# Patient Record
Sex: Female | Born: 1935
Health system: Southern US, Community
[De-identification: ages and names within clinical notes are randomized; demographics above are authoritative.]

## PROBLEM LIST (undated history)

## (undated) DIAGNOSIS — C539 Malignant neoplasm of cervix uteri, unspecified: Secondary | ICD-10-CM

## (undated) DIAGNOSIS — D649 Anemia, unspecified: Secondary | ICD-10-CM

## (undated) DIAGNOSIS — I5022 Chronic systolic (congestive) heart failure: Secondary | ICD-10-CM

## (undated) DIAGNOSIS — I119 Hypertensive heart disease without heart failure: Secondary | ICD-10-CM

## (undated) DIAGNOSIS — E119 Type 2 diabetes mellitus without complications: Secondary | ICD-10-CM

## (undated) DIAGNOSIS — I447 Left bundle-branch block, unspecified: Secondary | ICD-10-CM

## (undated) DIAGNOSIS — C189 Malignant neoplasm of colon, unspecified: Secondary | ICD-10-CM

## (undated) DIAGNOSIS — I255 Ischemic cardiomyopathy: Secondary | ICD-10-CM

## (undated) DIAGNOSIS — M1990 Unspecified osteoarthritis, unspecified site: Secondary | ICD-10-CM

## (undated) DIAGNOSIS — Z8619 Personal history of other infectious and parasitic diseases: Secondary | ICD-10-CM

## (undated) DIAGNOSIS — I2510 Atherosclerotic heart disease of native coronary artery without angina pectoris: Secondary | ICD-10-CM

## (undated) DIAGNOSIS — E785 Hyperlipidemia, unspecified: Secondary | ICD-10-CM

## (undated) HISTORY — DX: Malignant neoplasm of cervix uteri, unspecified: C53.9

## (undated) HISTORY — DX: Atherosclerotic heart disease of native coronary artery without angina pectoris: I25.10

## (undated) HISTORY — DX: Unspecified osteoarthritis, unspecified site: M19.90

## (undated) HISTORY — DX: Malignant neoplasm of colon, unspecified: C18.9

## (undated) HISTORY — DX: Anemia, unspecified: D64.9

## (undated) HISTORY — PX: CORONARY STENT PLACEMENT: SHX1402

## (undated) HISTORY — DX: Hyperlipidemia, unspecified: E78.5

## (undated) HISTORY — DX: Chronic systolic (congestive) heart failure: I50.22

## (undated) HISTORY — DX: Hypertensive heart disease without heart failure: I11.9

## (undated) HISTORY — DX: Personal history of other infectious and parasitic diseases: Z86.19

## (undated) HISTORY — DX: Ischemic cardiomyopathy: I25.5

## (undated) HISTORY — DX: Left bundle-branch block, unspecified: I44.7

## (undated) HISTORY — DX: Type 2 diabetes mellitus without complications: E11.9

---

## 1999-09-21 DIAGNOSIS — C189 Malignant neoplasm of colon, unspecified: Secondary | ICD-10-CM

## 1999-09-21 HISTORY — PX: COLON RESECTION: SHX5231

## 1999-09-21 HISTORY — DX: Malignant neoplasm of colon, unspecified: C18.9

## 2003-09-21 DIAGNOSIS — C539 Malignant neoplasm of cervix uteri, unspecified: Secondary | ICD-10-CM

## 2003-09-21 HISTORY — DX: Malignant neoplasm of cervix uteri, unspecified: C53.9

## 2003-09-21 HISTORY — PX: TOTAL ABDOMINAL HYSTERECTOMY: SHX209

## 2011-11-19 HISTORY — PX: COLONOSCOPY: SHX174

## 2011-11-26 LAB — HM COLONOSCOPY

## 2014-09-27 DIAGNOSIS — M2011 Hallux valgus (acquired), right foot: Secondary | ICD-10-CM | POA: Diagnosis not present

## 2014-09-27 DIAGNOSIS — M79675 Pain in left toe(s): Secondary | ICD-10-CM | POA: Diagnosis not present

## 2014-09-27 DIAGNOSIS — M2012 Hallux valgus (acquired), left foot: Secondary | ICD-10-CM | POA: Diagnosis not present

## 2014-09-27 DIAGNOSIS — M2042 Other hammer toe(s) (acquired), left foot: Secondary | ICD-10-CM | POA: Diagnosis not present

## 2014-09-27 DIAGNOSIS — E119 Type 2 diabetes mellitus without complications: Secondary | ICD-10-CM | POA: Diagnosis not present

## 2014-09-27 DIAGNOSIS — M79674 Pain in right toe(s): Secondary | ICD-10-CM | POA: Diagnosis not present

## 2014-09-27 DIAGNOSIS — B351 Tinea unguium: Secondary | ICD-10-CM | POA: Diagnosis not present

## 2014-09-27 DIAGNOSIS — M2041 Other hammer toe(s) (acquired), right foot: Secondary | ICD-10-CM | POA: Diagnosis not present

## 2014-09-30 DIAGNOSIS — I10 Essential (primary) hypertension: Secondary | ICD-10-CM | POA: Diagnosis not present

## 2014-09-30 DIAGNOSIS — E119 Type 2 diabetes mellitus without complications: Secondary | ICD-10-CM | POA: Diagnosis not present

## 2014-10-14 DIAGNOSIS — I10 Essential (primary) hypertension: Secondary | ICD-10-CM | POA: Diagnosis not present

## 2014-10-14 DIAGNOSIS — E119 Type 2 diabetes mellitus without complications: Secondary | ICD-10-CM | POA: Diagnosis not present

## 2014-10-14 DIAGNOSIS — M2550 Pain in unspecified joint: Secondary | ICD-10-CM | POA: Diagnosis not present

## 2014-11-29 DIAGNOSIS — M79675 Pain in left toe(s): Secondary | ICD-10-CM | POA: Diagnosis not present

## 2014-11-29 DIAGNOSIS — M2011 Hallux valgus (acquired), right foot: Secondary | ICD-10-CM | POA: Diagnosis not present

## 2014-11-29 DIAGNOSIS — E119 Type 2 diabetes mellitus without complications: Secondary | ICD-10-CM | POA: Diagnosis not present

## 2014-11-29 DIAGNOSIS — M79674 Pain in right toe(s): Secondary | ICD-10-CM | POA: Diagnosis not present

## 2014-11-29 DIAGNOSIS — M2012 Hallux valgus (acquired), left foot: Secondary | ICD-10-CM | POA: Diagnosis not present

## 2014-11-29 DIAGNOSIS — M2041 Other hammer toe(s) (acquired), right foot: Secondary | ICD-10-CM | POA: Diagnosis not present

## 2014-11-29 DIAGNOSIS — M2042 Other hammer toe(s) (acquired), left foot: Secondary | ICD-10-CM | POA: Diagnosis not present

## 2014-11-29 DIAGNOSIS — B351 Tinea unguium: Secondary | ICD-10-CM | POA: Diagnosis not present

## 2015-01-31 DIAGNOSIS — M2011 Hallux valgus (acquired), right foot: Secondary | ICD-10-CM | POA: Diagnosis not present

## 2015-01-31 DIAGNOSIS — E1165 Type 2 diabetes mellitus with hyperglycemia: Secondary | ICD-10-CM | POA: Diagnosis not present

## 2015-01-31 DIAGNOSIS — M2012 Hallux valgus (acquired), left foot: Secondary | ICD-10-CM | POA: Diagnosis not present

## 2015-01-31 DIAGNOSIS — M79674 Pain in right toe(s): Secondary | ICD-10-CM | POA: Diagnosis not present

## 2015-01-31 DIAGNOSIS — M2042 Other hammer toe(s) (acquired), left foot: Secondary | ICD-10-CM | POA: Diagnosis not present

## 2015-01-31 DIAGNOSIS — M2041 Other hammer toe(s) (acquired), right foot: Secondary | ICD-10-CM | POA: Diagnosis not present

## 2015-01-31 DIAGNOSIS — B351 Tinea unguium: Secondary | ICD-10-CM | POA: Diagnosis not present

## 2015-01-31 DIAGNOSIS — M79675 Pain in left toe(s): Secondary | ICD-10-CM | POA: Diagnosis not present

## 2015-02-11 DIAGNOSIS — E119 Type 2 diabetes mellitus without complications: Secondary | ICD-10-CM | POA: Diagnosis not present

## 2015-02-11 DIAGNOSIS — E784 Other hyperlipidemia: Secondary | ICD-10-CM | POA: Diagnosis not present

## 2015-02-11 DIAGNOSIS — I10 Essential (primary) hypertension: Secondary | ICD-10-CM | POA: Diagnosis not present

## 2015-02-11 DIAGNOSIS — E039 Hypothyroidism, unspecified: Secondary | ICD-10-CM | POA: Diagnosis not present

## 2015-02-19 DIAGNOSIS — Z0000 Encounter for general adult medical examination without abnormal findings: Secondary | ICD-10-CM | POA: Diagnosis not present

## 2015-04-07 DIAGNOSIS — M2011 Hallux valgus (acquired), right foot: Secondary | ICD-10-CM | POA: Diagnosis not present

## 2015-04-07 DIAGNOSIS — M2012 Hallux valgus (acquired), left foot: Secondary | ICD-10-CM | POA: Diagnosis not present

## 2015-04-07 DIAGNOSIS — M79675 Pain in left toe(s): Secondary | ICD-10-CM | POA: Diagnosis not present

## 2015-04-07 DIAGNOSIS — B351 Tinea unguium: Secondary | ICD-10-CM | POA: Diagnosis not present

## 2015-04-07 DIAGNOSIS — M2041 Other hammer toe(s) (acquired), right foot: Secondary | ICD-10-CM | POA: Diagnosis not present

## 2015-04-07 DIAGNOSIS — M79674 Pain in right toe(s): Secondary | ICD-10-CM | POA: Diagnosis not present

## 2015-04-07 DIAGNOSIS — E1165 Type 2 diabetes mellitus with hyperglycemia: Secondary | ICD-10-CM | POA: Diagnosis not present

## 2015-04-07 DIAGNOSIS — M2042 Other hammer toe(s) (acquired), left foot: Secondary | ICD-10-CM | POA: Diagnosis not present

## 2015-06-09 DIAGNOSIS — M79674 Pain in right toe(s): Secondary | ICD-10-CM | POA: Diagnosis not present

## 2015-06-09 DIAGNOSIS — E1165 Type 2 diabetes mellitus with hyperglycemia: Secondary | ICD-10-CM | POA: Diagnosis not present

## 2015-06-09 DIAGNOSIS — M2012 Hallux valgus (acquired), left foot: Secondary | ICD-10-CM | POA: Diagnosis not present

## 2015-06-09 DIAGNOSIS — M2011 Hallux valgus (acquired), right foot: Secondary | ICD-10-CM | POA: Diagnosis not present

## 2015-06-09 DIAGNOSIS — M2041 Other hammer toe(s) (acquired), right foot: Secondary | ICD-10-CM | POA: Diagnosis not present

## 2015-06-09 DIAGNOSIS — M2042 Other hammer toe(s) (acquired), left foot: Secondary | ICD-10-CM | POA: Diagnosis not present

## 2015-06-09 DIAGNOSIS — B351 Tinea unguium: Secondary | ICD-10-CM | POA: Diagnosis not present

## 2015-06-09 DIAGNOSIS — M79675 Pain in left toe(s): Secondary | ICD-10-CM | POA: Diagnosis not present

## 2015-06-20 DIAGNOSIS — J209 Acute bronchitis, unspecified: Secondary | ICD-10-CM | POA: Diagnosis not present

## 2015-08-08 DIAGNOSIS — I10 Essential (primary) hypertension: Secondary | ICD-10-CM | POA: Diagnosis not present

## 2015-08-08 DIAGNOSIS — E119 Type 2 diabetes mellitus without complications: Secondary | ICD-10-CM | POA: Diagnosis not present

## 2015-08-08 DIAGNOSIS — E784 Other hyperlipidemia: Secondary | ICD-10-CM | POA: Diagnosis not present

## 2015-08-11 DIAGNOSIS — Z7984 Long term (current) use of oral hypoglycemic drugs: Secondary | ICD-10-CM | POA: Diagnosis not present

## 2015-08-11 DIAGNOSIS — M2011 Hallux valgus (acquired), right foot: Secondary | ICD-10-CM | POA: Diagnosis not present

## 2015-08-11 DIAGNOSIS — M2042 Other hammer toe(s) (acquired), left foot: Secondary | ICD-10-CM | POA: Diagnosis not present

## 2015-08-11 DIAGNOSIS — E1165 Type 2 diabetes mellitus with hyperglycemia: Secondary | ICD-10-CM | POA: Diagnosis not present

## 2015-08-11 DIAGNOSIS — M2041 Other hammer toe(s) (acquired), right foot: Secondary | ICD-10-CM | POA: Diagnosis not present

## 2015-08-11 DIAGNOSIS — M79675 Pain in left toe(s): Secondary | ICD-10-CM | POA: Diagnosis not present

## 2015-08-11 DIAGNOSIS — B351 Tinea unguium: Secondary | ICD-10-CM | POA: Diagnosis not present

## 2015-08-11 DIAGNOSIS — M79674 Pain in right toe(s): Secondary | ICD-10-CM | POA: Diagnosis not present

## 2015-08-11 DIAGNOSIS — M2012 Hallux valgus (acquired), left foot: Secondary | ICD-10-CM | POA: Diagnosis not present

## 2015-09-03 DIAGNOSIS — M2550 Pain in unspecified joint: Secondary | ICD-10-CM | POA: Diagnosis not present

## 2015-09-03 DIAGNOSIS — E119 Type 2 diabetes mellitus without complications: Secondary | ICD-10-CM | POA: Diagnosis not present

## 2015-09-03 DIAGNOSIS — I10 Essential (primary) hypertension: Secondary | ICD-10-CM | POA: Diagnosis not present

## 2015-09-03 DIAGNOSIS — I70219 Atherosclerosis of native arteries of extremities with intermittent claudication, unspecified extremity: Secondary | ICD-10-CM | POA: Diagnosis not present

## 2015-09-03 DIAGNOSIS — I70209 Unspecified atherosclerosis of native arteries of extremities, unspecified extremity: Secondary | ICD-10-CM | POA: Diagnosis not present

## 2015-09-16 DIAGNOSIS — R1013 Epigastric pain: Secondary | ICD-10-CM | POA: Diagnosis not present

## 2015-09-24 DIAGNOSIS — R609 Edema, unspecified: Secondary | ICD-10-CM | POA: Diagnosis not present

## 2015-09-24 DIAGNOSIS — R600 Localized edema: Secondary | ICD-10-CM | POA: Diagnosis not present

## 2015-09-24 DIAGNOSIS — E119 Type 2 diabetes mellitus without complications: Secondary | ICD-10-CM | POA: Diagnosis not present

## 2015-09-25 DIAGNOSIS — I2510 Atherosclerotic heart disease of native coronary artery without angina pectoris: Secondary | ICD-10-CM | POA: Diagnosis not present

## 2015-09-25 DIAGNOSIS — E119 Type 2 diabetes mellitus without complications: Secondary | ICD-10-CM | POA: Diagnosis not present

## 2015-09-25 DIAGNOSIS — I509 Heart failure, unspecified: Secondary | ICD-10-CM | POA: Diagnosis not present

## 2015-09-25 DIAGNOSIS — Z23 Encounter for immunization: Secondary | ICD-10-CM | POA: Diagnosis not present

## 2015-09-25 DIAGNOSIS — E785 Hyperlipidemia, unspecified: Secondary | ICD-10-CM | POA: Diagnosis present

## 2015-09-25 DIAGNOSIS — I5043 Acute on chronic combined systolic (congestive) and diastolic (congestive) heart failure: Secondary | ICD-10-CM | POA: Diagnosis not present

## 2015-09-25 DIAGNOSIS — N390 Urinary tract infection, site not specified: Secondary | ICD-10-CM | POA: Diagnosis present

## 2015-09-25 DIAGNOSIS — J90 Pleural effusion, not elsewhere classified: Secondary | ICD-10-CM | POA: Diagnosis not present

## 2015-09-25 DIAGNOSIS — I10 Essential (primary) hypertension: Secondary | ICD-10-CM | POA: Diagnosis not present

## 2015-09-25 DIAGNOSIS — R600 Localized edema: Secondary | ICD-10-CM | POA: Diagnosis not present

## 2015-09-25 DIAGNOSIS — E871 Hypo-osmolality and hyponatremia: Secondary | ICD-10-CM | POA: Diagnosis present

## 2015-10-01 DIAGNOSIS — I2510 Atherosclerotic heart disease of native coronary artery without angina pectoris: Secondary | ICD-10-CM | POA: Diagnosis not present

## 2015-10-01 DIAGNOSIS — Z7984 Long term (current) use of oral hypoglycemic drugs: Secondary | ICD-10-CM | POA: Diagnosis not present

## 2015-10-01 DIAGNOSIS — R609 Edema, unspecified: Secondary | ICD-10-CM | POA: Diagnosis not present

## 2015-10-01 DIAGNOSIS — I509 Heart failure, unspecified: Secondary | ICD-10-CM | POA: Diagnosis not present

## 2015-10-01 DIAGNOSIS — E119 Type 2 diabetes mellitus without complications: Secondary | ICD-10-CM | POA: Diagnosis not present

## 2015-10-01 DIAGNOSIS — N390 Urinary tract infection, site not specified: Secondary | ICD-10-CM | POA: Diagnosis not present

## 2015-10-01 DIAGNOSIS — I110 Hypertensive heart disease with heart failure: Secondary | ICD-10-CM | POA: Diagnosis not present

## 2015-10-02 DIAGNOSIS — R079 Chest pain, unspecified: Secondary | ICD-10-CM | POA: Diagnosis not present

## 2015-10-02 DIAGNOSIS — E1169 Type 2 diabetes mellitus with other specified complication: Secondary | ICD-10-CM | POA: Diagnosis not present

## 2015-10-02 DIAGNOSIS — R0602 Shortness of breath: Secondary | ICD-10-CM | POA: Diagnosis not present

## 2015-10-02 DIAGNOSIS — I429 Cardiomyopathy, unspecified: Secondary | ICD-10-CM | POA: Diagnosis not present

## 2015-10-02 DIAGNOSIS — R600 Localized edema: Secondary | ICD-10-CM | POA: Diagnosis not present

## 2015-10-03 DIAGNOSIS — E119 Type 2 diabetes mellitus without complications: Secondary | ICD-10-CM | POA: Diagnosis not present

## 2015-10-03 DIAGNOSIS — N390 Urinary tract infection, site not specified: Secondary | ICD-10-CM | POA: Diagnosis not present

## 2015-10-03 DIAGNOSIS — I2510 Atherosclerotic heart disease of native coronary artery without angina pectoris: Secondary | ICD-10-CM | POA: Diagnosis not present

## 2015-10-03 DIAGNOSIS — I509 Heart failure, unspecified: Secondary | ICD-10-CM | POA: Diagnosis not present

## 2015-10-03 DIAGNOSIS — Z7984 Long term (current) use of oral hypoglycemic drugs: Secondary | ICD-10-CM | POA: Diagnosis not present

## 2015-10-03 DIAGNOSIS — I110 Hypertensive heart disease with heart failure: Secondary | ICD-10-CM | POA: Diagnosis not present

## 2015-10-06 DIAGNOSIS — I110 Hypertensive heart disease with heart failure: Secondary | ICD-10-CM | POA: Diagnosis not present

## 2015-10-06 DIAGNOSIS — I2510 Atherosclerotic heart disease of native coronary artery without angina pectoris: Secondary | ICD-10-CM | POA: Diagnosis not present

## 2015-10-06 DIAGNOSIS — I509 Heart failure, unspecified: Secondary | ICD-10-CM | POA: Diagnosis not present

## 2015-10-06 DIAGNOSIS — N390 Urinary tract infection, site not specified: Secondary | ICD-10-CM | POA: Diagnosis not present

## 2015-10-06 DIAGNOSIS — Z7984 Long term (current) use of oral hypoglycemic drugs: Secondary | ICD-10-CM | POA: Diagnosis not present

## 2015-10-06 DIAGNOSIS — E119 Type 2 diabetes mellitus without complications: Secondary | ICD-10-CM | POA: Diagnosis not present

## 2015-10-07 DIAGNOSIS — Z7984 Long term (current) use of oral hypoglycemic drugs: Secondary | ICD-10-CM | POA: Diagnosis not present

## 2015-10-07 DIAGNOSIS — E119 Type 2 diabetes mellitus without complications: Secondary | ICD-10-CM | POA: Diagnosis not present

## 2015-10-07 DIAGNOSIS — N390 Urinary tract infection, site not specified: Secondary | ICD-10-CM | POA: Diagnosis not present

## 2015-10-07 DIAGNOSIS — R0602 Shortness of breath: Secondary | ICD-10-CM | POA: Diagnosis not present

## 2015-10-07 DIAGNOSIS — I509 Heart failure, unspecified: Secondary | ICD-10-CM | POA: Diagnosis not present

## 2015-10-07 DIAGNOSIS — R0600 Dyspnea, unspecified: Secondary | ICD-10-CM | POA: Diagnosis not present

## 2015-10-07 DIAGNOSIS — I2510 Atherosclerotic heart disease of native coronary artery without angina pectoris: Secondary | ICD-10-CM | POA: Diagnosis not present

## 2015-10-07 DIAGNOSIS — I110 Hypertensive heart disease with heart failure: Secondary | ICD-10-CM | POA: Diagnosis not present

## 2015-10-08 DIAGNOSIS — N390 Urinary tract infection, site not specified: Secondary | ICD-10-CM | POA: Diagnosis not present

## 2015-10-08 DIAGNOSIS — I2510 Atherosclerotic heart disease of native coronary artery without angina pectoris: Secondary | ICD-10-CM | POA: Diagnosis not present

## 2015-10-08 DIAGNOSIS — I509 Heart failure, unspecified: Secondary | ICD-10-CM | POA: Diagnosis not present

## 2015-10-08 DIAGNOSIS — Z7984 Long term (current) use of oral hypoglycemic drugs: Secondary | ICD-10-CM | POA: Diagnosis not present

## 2015-10-08 DIAGNOSIS — I110 Hypertensive heart disease with heart failure: Secondary | ICD-10-CM | POA: Diagnosis not present

## 2015-10-08 DIAGNOSIS — E119 Type 2 diabetes mellitus without complications: Secondary | ICD-10-CM | POA: Diagnosis not present

## 2015-10-10 DIAGNOSIS — Z7984 Long term (current) use of oral hypoglycemic drugs: Secondary | ICD-10-CM | POA: Diagnosis not present

## 2015-10-10 DIAGNOSIS — E119 Type 2 diabetes mellitus without complications: Secondary | ICD-10-CM | POA: Diagnosis not present

## 2015-10-10 DIAGNOSIS — I2510 Atherosclerotic heart disease of native coronary artery without angina pectoris: Secondary | ICD-10-CM | POA: Diagnosis not present

## 2015-10-10 DIAGNOSIS — N390 Urinary tract infection, site not specified: Secondary | ICD-10-CM | POA: Diagnosis not present

## 2015-10-10 DIAGNOSIS — I110 Hypertensive heart disease with heart failure: Secondary | ICD-10-CM | POA: Diagnosis not present

## 2015-10-10 DIAGNOSIS — I509 Heart failure, unspecified: Secondary | ICD-10-CM | POA: Diagnosis not present

## 2015-10-13 DIAGNOSIS — I509 Heart failure, unspecified: Secondary | ICD-10-CM | POA: Diagnosis not present

## 2015-10-13 DIAGNOSIS — I2510 Atherosclerotic heart disease of native coronary artery without angina pectoris: Secondary | ICD-10-CM | POA: Diagnosis not present

## 2015-10-13 DIAGNOSIS — Z7984 Long term (current) use of oral hypoglycemic drugs: Secondary | ICD-10-CM | POA: Diagnosis not present

## 2015-10-13 DIAGNOSIS — E119 Type 2 diabetes mellitus without complications: Secondary | ICD-10-CM | POA: Diagnosis not present

## 2015-10-13 DIAGNOSIS — I110 Hypertensive heart disease with heart failure: Secondary | ICD-10-CM | POA: Diagnosis not present

## 2015-10-13 DIAGNOSIS — N390 Urinary tract infection, site not specified: Secondary | ICD-10-CM | POA: Diagnosis not present

## 2015-10-14 DIAGNOSIS — Z7984 Long term (current) use of oral hypoglycemic drugs: Secondary | ICD-10-CM | POA: Diagnosis not present

## 2015-10-14 DIAGNOSIS — I110 Hypertensive heart disease with heart failure: Secondary | ICD-10-CM | POA: Diagnosis not present

## 2015-10-14 DIAGNOSIS — I509 Heart failure, unspecified: Secondary | ICD-10-CM | POA: Diagnosis not present

## 2015-10-14 DIAGNOSIS — E119 Type 2 diabetes mellitus without complications: Secondary | ICD-10-CM | POA: Diagnosis not present

## 2015-10-14 DIAGNOSIS — I2510 Atherosclerotic heart disease of native coronary artery without angina pectoris: Secondary | ICD-10-CM | POA: Diagnosis not present

## 2015-10-14 DIAGNOSIS — N390 Urinary tract infection, site not specified: Secondary | ICD-10-CM | POA: Diagnosis not present

## 2015-10-15 DIAGNOSIS — Z7984 Long term (current) use of oral hypoglycemic drugs: Secondary | ICD-10-CM | POA: Diagnosis not present

## 2015-10-15 DIAGNOSIS — N390 Urinary tract infection, site not specified: Secondary | ICD-10-CM | POA: Diagnosis not present

## 2015-10-15 DIAGNOSIS — I110 Hypertensive heart disease with heart failure: Secondary | ICD-10-CM | POA: Diagnosis not present

## 2015-10-15 DIAGNOSIS — E119 Type 2 diabetes mellitus without complications: Secondary | ICD-10-CM | POA: Diagnosis not present

## 2015-10-15 DIAGNOSIS — I2510 Atherosclerotic heart disease of native coronary artery without angina pectoris: Secondary | ICD-10-CM | POA: Diagnosis not present

## 2015-10-15 DIAGNOSIS — I509 Heart failure, unspecified: Secondary | ICD-10-CM | POA: Diagnosis not present

## 2015-10-17 DIAGNOSIS — E119 Type 2 diabetes mellitus without complications: Secondary | ICD-10-CM | POA: Diagnosis not present

## 2015-10-17 DIAGNOSIS — I509 Heart failure, unspecified: Secondary | ICD-10-CM | POA: Diagnosis not present

## 2015-10-17 DIAGNOSIS — N390 Urinary tract infection, site not specified: Secondary | ICD-10-CM | POA: Diagnosis not present

## 2015-10-17 DIAGNOSIS — I2510 Atherosclerotic heart disease of native coronary artery without angina pectoris: Secondary | ICD-10-CM | POA: Diagnosis not present

## 2015-10-17 DIAGNOSIS — Z7984 Long term (current) use of oral hypoglycemic drugs: Secondary | ICD-10-CM | POA: Diagnosis not present

## 2015-10-17 DIAGNOSIS — I110 Hypertensive heart disease with heart failure: Secondary | ICD-10-CM | POA: Diagnosis not present

## 2015-10-20 DIAGNOSIS — I110 Hypertensive heart disease with heart failure: Secondary | ICD-10-CM | POA: Diagnosis not present

## 2015-10-20 DIAGNOSIS — Z7984 Long term (current) use of oral hypoglycemic drugs: Secondary | ICD-10-CM | POA: Diagnosis not present

## 2015-10-20 DIAGNOSIS — E119 Type 2 diabetes mellitus without complications: Secondary | ICD-10-CM | POA: Diagnosis not present

## 2015-10-20 DIAGNOSIS — N390 Urinary tract infection, site not specified: Secondary | ICD-10-CM | POA: Diagnosis not present

## 2015-10-20 DIAGNOSIS — I509 Heart failure, unspecified: Secondary | ICD-10-CM | POA: Diagnosis not present

## 2015-10-20 DIAGNOSIS — I2510 Atherosclerotic heart disease of native coronary artery without angina pectoris: Secondary | ICD-10-CM | POA: Diagnosis not present

## 2015-10-21 DIAGNOSIS — I509 Heart failure, unspecified: Secondary | ICD-10-CM | POA: Diagnosis not present

## 2015-10-21 DIAGNOSIS — I429 Cardiomyopathy, unspecified: Secondary | ICD-10-CM | POA: Diagnosis not present

## 2015-10-21 DIAGNOSIS — R0602 Shortness of breath: Secondary | ICD-10-CM | POA: Diagnosis not present

## 2015-10-21 DIAGNOSIS — E1169 Type 2 diabetes mellitus with other specified complication: Secondary | ICD-10-CM | POA: Diagnosis not present

## 2015-10-21 DIAGNOSIS — R600 Localized edema: Secondary | ICD-10-CM | POA: Diagnosis not present

## 2015-10-21 DIAGNOSIS — I259 Chronic ischemic heart disease, unspecified: Secondary | ICD-10-CM | POA: Diagnosis not present

## 2015-10-22 DIAGNOSIS — I2510 Atherosclerotic heart disease of native coronary artery without angina pectoris: Secondary | ICD-10-CM | POA: Diagnosis not present

## 2015-10-22 DIAGNOSIS — Z7984 Long term (current) use of oral hypoglycemic drugs: Secondary | ICD-10-CM | POA: Diagnosis not present

## 2015-10-22 DIAGNOSIS — I509 Heart failure, unspecified: Secondary | ICD-10-CM | POA: Diagnosis not present

## 2015-10-22 DIAGNOSIS — E119 Type 2 diabetes mellitus without complications: Secondary | ICD-10-CM | POA: Diagnosis not present

## 2015-10-22 DIAGNOSIS — I110 Hypertensive heart disease with heart failure: Secondary | ICD-10-CM | POA: Diagnosis not present

## 2015-10-22 DIAGNOSIS — N390 Urinary tract infection, site not specified: Secondary | ICD-10-CM | POA: Diagnosis not present

## 2015-10-23 DIAGNOSIS — I2510 Atherosclerotic heart disease of native coronary artery without angina pectoris: Secondary | ICD-10-CM | POA: Diagnosis not present

## 2015-10-23 DIAGNOSIS — I110 Hypertensive heart disease with heart failure: Secondary | ICD-10-CM | POA: Diagnosis not present

## 2015-10-23 DIAGNOSIS — I509 Heart failure, unspecified: Secondary | ICD-10-CM | POA: Diagnosis not present

## 2015-10-23 DIAGNOSIS — Z7984 Long term (current) use of oral hypoglycemic drugs: Secondary | ICD-10-CM | POA: Diagnosis not present

## 2015-10-23 DIAGNOSIS — N390 Urinary tract infection, site not specified: Secondary | ICD-10-CM | POA: Diagnosis not present

## 2015-10-23 DIAGNOSIS — E119 Type 2 diabetes mellitus without complications: Secondary | ICD-10-CM | POA: Diagnosis not present

## 2015-10-31 DIAGNOSIS — Z7982 Long term (current) use of aspirin: Secondary | ICD-10-CM | POA: Diagnosis not present

## 2015-10-31 DIAGNOSIS — I10 Essential (primary) hypertension: Secondary | ICD-10-CM | POA: Diagnosis not present

## 2015-10-31 DIAGNOSIS — I2510 Atherosclerotic heart disease of native coronary artery without angina pectoris: Secondary | ICD-10-CM | POA: Diagnosis not present

## 2015-10-31 DIAGNOSIS — I429 Cardiomyopathy, unspecified: Secondary | ICD-10-CM | POA: Diagnosis not present

## 2015-10-31 DIAGNOSIS — Z7902 Long term (current) use of antithrombotics/antiplatelets: Secondary | ICD-10-CM | POA: Diagnosis not present

## 2015-10-31 DIAGNOSIS — I447 Left bundle-branch block, unspecified: Secondary | ICD-10-CM | POA: Diagnosis not present

## 2015-10-31 DIAGNOSIS — R0602 Shortness of breath: Secondary | ICD-10-CM | POA: Diagnosis not present

## 2015-10-31 DIAGNOSIS — R9439 Abnormal result of other cardiovascular function study: Secondary | ICD-10-CM | POA: Diagnosis not present

## 2015-10-31 DIAGNOSIS — E119 Type 2 diabetes mellitus without complications: Secondary | ICD-10-CM | POA: Diagnosis not present

## 2015-11-01 DIAGNOSIS — I447 Left bundle-branch block, unspecified: Secondary | ICD-10-CM | POA: Diagnosis not present

## 2015-11-01 DIAGNOSIS — I2510 Atherosclerotic heart disease of native coronary artery without angina pectoris: Secondary | ICD-10-CM | POA: Diagnosis not present

## 2015-11-01 DIAGNOSIS — E119 Type 2 diabetes mellitus without complications: Secondary | ICD-10-CM | POA: Diagnosis not present

## 2015-11-01 DIAGNOSIS — I10 Essential (primary) hypertension: Secondary | ICD-10-CM | POA: Diagnosis not present

## 2015-11-01 DIAGNOSIS — I429 Cardiomyopathy, unspecified: Secondary | ICD-10-CM | POA: Diagnosis not present

## 2015-11-01 DIAGNOSIS — I420 Dilated cardiomyopathy: Secondary | ICD-10-CM | POA: Diagnosis not present

## 2015-11-01 DIAGNOSIS — R0602 Shortness of breath: Secondary | ICD-10-CM | POA: Diagnosis not present

## 2015-11-02 DIAGNOSIS — I10 Essential (primary) hypertension: Secondary | ICD-10-CM | POA: Diagnosis not present

## 2015-11-02 DIAGNOSIS — I447 Left bundle-branch block, unspecified: Secondary | ICD-10-CM | POA: Diagnosis not present

## 2015-11-02 DIAGNOSIS — I420 Dilated cardiomyopathy: Secondary | ICD-10-CM | POA: Diagnosis not present

## 2015-11-02 DIAGNOSIS — E119 Type 2 diabetes mellitus without complications: Secondary | ICD-10-CM | POA: Diagnosis not present

## 2015-11-02 DIAGNOSIS — R0602 Shortness of breath: Secondary | ICD-10-CM | POA: Diagnosis not present

## 2015-11-02 DIAGNOSIS — I2510 Atherosclerotic heart disease of native coronary artery without angina pectoris: Secondary | ICD-10-CM | POA: Diagnosis not present

## 2015-11-02 DIAGNOSIS — I429 Cardiomyopathy, unspecified: Secondary | ICD-10-CM | POA: Diagnosis not present

## 2015-11-10 DIAGNOSIS — R609 Edema, unspecified: Secondary | ICD-10-CM | POA: Diagnosis not present

## 2015-11-10 DIAGNOSIS — E119 Type 2 diabetes mellitus without complications: Secondary | ICD-10-CM | POA: Diagnosis not present

## 2015-11-10 DIAGNOSIS — K5900 Constipation, unspecified: Secondary | ICD-10-CM | POA: Diagnosis not present

## 2015-11-12 DIAGNOSIS — Z9862 Peripheral vascular angioplasty status: Secondary | ICD-10-CM | POA: Diagnosis not present

## 2015-11-12 DIAGNOSIS — I509 Heart failure, unspecified: Secondary | ICD-10-CM | POA: Diagnosis not present

## 2015-11-12 DIAGNOSIS — R0602 Shortness of breath: Secondary | ICD-10-CM | POA: Diagnosis not present

## 2015-11-12 DIAGNOSIS — E119 Type 2 diabetes mellitus without complications: Secondary | ICD-10-CM | POA: Diagnosis not present

## 2015-11-12 DIAGNOSIS — I10 Essential (primary) hypertension: Secondary | ICD-10-CM | POA: Diagnosis not present

## 2015-11-12 DIAGNOSIS — I429 Cardiomyopathy, unspecified: Secondary | ICD-10-CM | POA: Diagnosis not present

## 2015-11-12 DIAGNOSIS — E785 Hyperlipidemia, unspecified: Secondary | ICD-10-CM | POA: Diagnosis not present

## 2015-11-12 DIAGNOSIS — I259 Chronic ischemic heart disease, unspecified: Secondary | ICD-10-CM | POA: Diagnosis not present

## 2015-11-17 DIAGNOSIS — Z7984 Long term (current) use of oral hypoglycemic drugs: Secondary | ICD-10-CM | POA: Diagnosis not present

## 2015-11-17 DIAGNOSIS — M2041 Other hammer toe(s) (acquired), right foot: Secondary | ICD-10-CM | POA: Diagnosis not present

## 2015-11-17 DIAGNOSIS — M2012 Hallux valgus (acquired), left foot: Secondary | ICD-10-CM | POA: Diagnosis not present

## 2015-11-17 DIAGNOSIS — M2011 Hallux valgus (acquired), right foot: Secondary | ICD-10-CM | POA: Diagnosis not present

## 2015-11-17 DIAGNOSIS — M79675 Pain in left toe(s): Secondary | ICD-10-CM | POA: Diagnosis not present

## 2015-11-17 DIAGNOSIS — E1165 Type 2 diabetes mellitus with hyperglycemia: Secondary | ICD-10-CM | POA: Diagnosis not present

## 2015-11-17 DIAGNOSIS — M79674 Pain in right toe(s): Secondary | ICD-10-CM | POA: Diagnosis not present

## 2015-11-17 DIAGNOSIS — M2042 Other hammer toe(s) (acquired), left foot: Secondary | ICD-10-CM | POA: Diagnosis not present

## 2015-11-17 DIAGNOSIS — B351 Tinea unguium: Secondary | ICD-10-CM | POA: Diagnosis not present

## 2016-01-14 ENCOUNTER — Ambulatory Visit (INDEPENDENT_AMBULATORY_CARE_PROVIDER_SITE_OTHER): Payer: Medicare Other | Admitting: Physician Assistant

## 2016-01-14 ENCOUNTER — Encounter: Payer: Self-pay | Admitting: Physician Assistant

## 2016-01-14 ENCOUNTER — Other Ambulatory Visit: Payer: Self-pay | Admitting: Physician Assistant

## 2016-01-14 VITALS — BP 100/60 | HR 80 | Temp 97.9°F | Resp 18 | Ht 62.0 in | Wt 155.0 lb

## 2016-01-14 DIAGNOSIS — Z7689 Persons encountering health services in other specified circumstances: Secondary | ICD-10-CM

## 2016-01-14 DIAGNOSIS — Z7189 Other specified counseling: Secondary | ICD-10-CM | POA: Diagnosis not present

## 2016-01-14 DIAGNOSIS — E1149 Type 2 diabetes mellitus with other diabetic neurological complication: Secondary | ICD-10-CM

## 2016-01-14 DIAGNOSIS — D509 Iron deficiency anemia, unspecified: Secondary | ICD-10-CM | POA: Diagnosis not present

## 2016-01-14 DIAGNOSIS — I5022 Chronic systolic (congestive) heart failure: Secondary | ICD-10-CM | POA: Diagnosis not present

## 2016-01-14 DIAGNOSIS — E119 Type 2 diabetes mellitus without complications: Secondary | ICD-10-CM | POA: Insufficient documentation

## 2016-01-14 DIAGNOSIS — G629 Polyneuropathy, unspecified: Secondary | ICD-10-CM | POA: Diagnosis not present

## 2016-01-14 DIAGNOSIS — I2510 Atherosclerotic heart disease of native coronary artery without angina pectoris: Secondary | ICD-10-CM

## 2016-01-14 DIAGNOSIS — E538 Deficiency of other specified B group vitamins: Secondary | ICD-10-CM | POA: Diagnosis not present

## 2016-01-14 DIAGNOSIS — D649 Anemia, unspecified: Secondary | ICD-10-CM | POA: Diagnosis not present

## 2016-01-14 DIAGNOSIS — IMO0002 Reserved for concepts with insufficient information to code with codable children: Secondary | ICD-10-CM | POA: Insufficient documentation

## 2016-01-14 DIAGNOSIS — E1142 Type 2 diabetes mellitus with diabetic polyneuropathy: Secondary | ICD-10-CM | POA: Insufficient documentation

## 2016-01-14 DIAGNOSIS — E1140 Type 2 diabetes mellitus with diabetic neuropathy, unspecified: Secondary | ICD-10-CM | POA: Insufficient documentation

## 2016-01-15 LAB — COMPLETE METABOLIC PANEL WITH GFR
ALT: 7 U/L (ref 6–29)
AST: 13 U/L (ref 10–35)
Albumin: 3.3 g/dL — ABNORMAL LOW (ref 3.6–5.1)
Alkaline Phosphatase: 114 U/L (ref 33–130)
BILIRUBIN TOTAL: 1.2 mg/dL (ref 0.2–1.2)
BUN: 22 mg/dL (ref 7–25)
CHLORIDE: 95 mmol/L — AB (ref 98–110)
CO2: 29 mmol/L (ref 20–31)
Calcium: 8.7 mg/dL (ref 8.6–10.4)
Creat: 1.03 mg/dL — ABNORMAL HIGH (ref 0.60–0.93)
GFR, EST AFRICAN AMERICAN: 60 mL/min (ref >=60)
GFR, EST NON AFRICAN AMERICAN: 52 mL/min — AB (ref >=60)
GLUCOSE: 228 mg/dL — AB (ref 70–99)
Potassium: 4.9 mmol/L (ref 3.5–5.3)
SODIUM: 134 mmol/L — AB (ref 135–146)
TOTAL PROTEIN: 7 g/dL (ref 6.1–8.1)

## 2016-01-15 LAB — CBC WITH DIFFERENTIAL/PLATELET
BASOS ABS: 71 {cells}/uL (ref 0–200)
BASOS PCT: 1 %
EOS PCT: 5 %
Eosinophils Absolute: 355 cells/uL (ref 15–500)
HCT: 30.6 % — ABNORMAL LOW (ref 35.0–45.0)
Hemoglobin: 9.1 g/dL — ABNORMAL LOW (ref 12.0–15.0)
LYMPHS PCT: 11 %
Lymphs Abs: 781 cells/uL — ABNORMAL LOW (ref 850–3900)
MCH: 22.2 pg — AB (ref 27.0–33.0)
MCHC: 29.7 g/dL — ABNORMAL LOW (ref 32.0–36.0)
MCV: 74.8 fL — AB (ref 80.0–100.0)
MONOS PCT: 9 %
MPV: 9 fL (ref 7.5–12.5)
Monocytes Absolute: 639 cells/uL (ref 200–950)
NEUTROS ABS: 5254 {cells}/uL (ref 1500–7800)
Neutrophils Relative %: 74 %
Platelets: 284 10*3/uL (ref 140–400)
RBC: 4.09 MIL/uL (ref 3.80–5.10)
RDW: 20.9 % — AB (ref 11.0–15.0)
WBC: 7.1 10*3/uL (ref 3.8–10.8)

## 2016-01-15 LAB — HEMOGLOBIN A1C
HEMOGLOBIN A1C: 9.2 % — AB (ref <5.7)
Mean Plasma Glucose: 217 mg/dL

## 2016-01-15 LAB — BRAIN NATRIURETIC PEPTIDE: Brain Natriuretic Peptide: 2185.4 pg/mL — ABNORMAL HIGH (ref <100)

## 2016-01-15 MED ORDER — FUROSEMIDE 80 MG PO TABS: 80.0000 mg | ORAL_TABLET | Freq: Two times a day (BID) | ORAL | Status: DC

## 2016-01-15 MED ORDER — POTASSIUM CHLORIDE CRYS ER 20 MEQ PO TBCR: 20.0000 meq | EXTENDED_RELEASE_TABLET | Freq: Two times a day (BID) | ORAL | Status: DC

## 2016-01-15 MED ORDER — SACUBITRIL-VALSARTAN 24-26 MG PO TABS: 1.0000 | ORAL_TABLET | Freq: Two times a day (BID) | ORAL | Status: DC

## 2016-01-15 NOTE — Addendum Note (Signed)
Addended by: Dena Billet on: 01/15/2016 04:12 PM   Modules accepted: Orders

## 2016-01-15 NOTE — Progress Notes (Addendum)
Patient ID: Jasmond Reinders MRN: [id number], DOB: 1934-12-30, 81 y.o. Date of Encounter: @DATE @  Chief Complaint:  Chief Complaint  Patient presents with  . new pt est care    history heart fail, has stents    HPI: 81 y.o. year old female  presents with her daughter for OV today.   She is here as a new patient to establish care with our practice. I do not have records yet. The following history is provided by her daughter today. Pt does not speak much English. Daughter translates/interprets.   Daughter states the patient just recently moved here from [place]--2 months ago. She was living with her other daughter in [place]. She reports that she recently got 2 stents to her heart February 2017 and at that time was told that she also has congestive heart failure. Says the prior to that, patient was independent so the other sister could care for her. She states that now, since these cardiac issues, she is not as independent --so it was decided that she should come live with this daughter. This daughter states that her own husband passed away several months ago and she does not work so it made sense for patient to move with her.  She does have the card given to them from the stent manufacturers. She received a stent to the OM and a stent to the circumflex on 10/31/15. Daughter states that "her heart is pumping less than 50% "  She says the patient had colon cancer in 2001 and had surgery. Says that she had cervical cancer in 2004 and had a complete hysterectomy. Says that she has had diabetes for 23 years. ------ notes that metformin was stopped secondary to GI adverse effects in the past  Says that her mom made it through all of these other medical problems and remained independent until these issues with her heart recently.  States that patient developed swelling and shortness of breath and went to her primary care physician in [place] and she then was set up with cardiologist and  had a nuclear test, then cath with stents.  Daughter also notes that patient had been on lisinopril but then was switched to [facility].  They report that when living in [place], she saw her Primary Care Physician, Cardiology, Podiatry. Had no other specialists. Says that she saw a podiatrist for diabetic foot care-- nails clipped  etc. secondary to her diabetes. Had no other specific problems with her feet.  Also reports the patient has significant diabetic neuropathy and that it is mostly affects her hands. Says that she "can do nothing with her hands ".  ---------TO F/U AT NEXT OV---DEPRESSION SCREEN INDICATES DEPRESSION----WILL DISCUSS THIS AT F/U OV--------------- She marked "Nearly every day" for the following: Little interest or pleasure in doing things Feeling down, depressed, or hopeless Trouble falling or staying asleep, or sleeping too much Feeling tired or having little energy Poor appetite or overeating Thoughts that she would be better off dead or of hurting yourself in some way  No past medical history on file.   Home Meds:    Medication List       .               aspirin EC 81 MG tablet  Take 81 mg by mouth daily.     BRILINTA 90 MG Tabs tablet  Generic drug:  ticagrelor  Take by mouth 2 (two) times daily.     carvedilol 6.25 MG  tablet  Commonly known as:  COREG  Take 6.25 mg by mouth 2 (two) times daily with a meal.     DIABETIC TUSSIN MAX ST PO  Take by mouth as needed.     ENTRESTO 24-26 MG  Generic drug:  sacubitril-valsartan  Take 1 tablet by mouth 2 (two) times daily.     furosemide 40 MG tablet  Commonly known as:  LASIX  Take 40 mg by mouth daily.     glipiZIDE 10 MG 24 hr tablet  Commonly known as:  GLUCOTROL XL  Take 10 mg by mouth daily with breakfast.     polyethylene glycol packet  Commonly known as:  MIRALAX / GLYCOLAX  Take 17 g by mouth daily as needed.     spironolactone 25 MG tablet  Commonly known as:  ALDACTONE  Take  25 mg by mouth daily.           Allergies: No Known Allergies  Social History   Social History  . Marital Status: Married    Spouse Name: N/A  . Number of Children: N/A  . Years of Education: N/A   Occupational History  . Not on file.   Social History Main Topics  . Smoking status: Never Smoker   . Smokeless tobacco: Never Used  . Alcohol Use: 0.0 oz/week    0 Standard drinks or equivalent per week     Comment: occasionally  . Drug Use: No  . Sexual Activity: Not on file   Other Topics Concern  . Not on file   Social History Narrative  . No narrative on file    No family history on file.   Review of Systems:  See HPI for pertinent ROS. All other ROS negative.    Physical Exam: Blood pressure 100/60, pulse 80, temperature 97.9 F (36.6 C), temperature source Oral, resp. rate 18, height 5\' 2"  (1.575 m), weight 155 lb (70.308 kg)., Body mass index is 28.34 kg/(m^2). General: WNWD Hispanic Female. Does not speak [place]. Says very little during the visit--even with her daughter, who translates/interprets. Flat affect. Appears in no acute distress. Neck: Supple. No thyromegaly. No lymphadenopathy. Lungs: Mild rales at bases. O/W clear. Heart: RRR with S1 S2. No murmurs, rubs, or gallops. Abdomen: Soft, non-tender, non-distended with normoactive bowel sounds. No hepatomegaly. No rebound/guarding. No obvious abdominal masses. Musculoskeletal:  Strength and tone normal for age. Extremities/Skin: Diabetic Foot Exam: Inspection normal. Very good skin care. No problem areas at all.  She has 2+ pedal pitting edema bilaterally Neuro: Alert and oriented X 3. Moves all extremities spontaneously. Gait is normal. CNII-XII grossly in tact. Psych:  Responds to questions appropriately with a normal affect.     ASSESSMENT AND PLAN:  81 y.o. year old female with  WILL DISCUSS DEPRESSION AT NEXT OV WILL REASSESS EDEMA, SOB, ORHTOPNEA AT NEXT OV WILL MAKE SURE SHE HAS APPT TO  SEE CARDIOLOGY   1. Encounter to establish care We currently have no records. Today she signed a medical release form for her prior primary care provider as well as her cardiologist. - Ambulatory referral to Cardiology  2. DM (diabetes mellitus), type 2 with neurological complications ([place]) Note the daughter states that in the past metformin was stopped secondary to GI adverse effects. ACE inhibitor was changed to ARB in [place] He is on no statin. She is not fasting today so we will have to wait and check lipid panel at future visit. Also will follow up records from [place]  and see if there is a reason that she is on no statin. We'll check following labs to monitor. - COMPLETE METABOLIC PANEL WITH GFR - Hemoglobin A1c  3. Coronary artery disease involving native coronary artery of native heart without angina pectoris This is currently stable. This will be managed by cardiology. - CBC with Differential/Platelet - Ambulatory referral to Cardiology  4. Chronic systolic heart failure ([employer]) Will check following labs and also place referral to establish with Cardiology. She does have lower extremity edema and some orthopnea and rales on exam----therefore I have told them and wrote out on her AVS and reviewed with daughter-- to take 2 of her Lasix 40 mg for a total of 80 mg per day on Thursday 4/27, Friday 4/28 and Saturday 4/29. She is to schedule follow-up visit with me in one week. - COMPLETE METABOLIC PANEL WITH GFR - Brain natriuretic peptide - Ambulatory referral to Cardiology ----------------------------------------------ADDENDUM ADDED 01/15/2016--------- REVIEWED LAB RESULTS------BNP VERY ELEVATED------AND PT WITH SIGNIFICANT EDEMA ON EXAM YESTERDAY---SO AM INCREASING DIURETIC DOSE FURTHER---- LASIX 80MG  AM AND 80MG  AT NOON. KDUR 20 MEQ AM AND 20MEQ AT NOON.  DO THIS DOSE UNTIL F/U OV MONDAY   5. Neuropathy ([place]) She is on no medication for neuropathy. Will follow-up with this  in the future.   [address] [place], [place], [facility] 01/15/2016 7:39 AM

## 2016-01-15 NOTE — Addendum Note (Signed)
Addended by: Dena Billet on: 01/15/2016 03:25 PM   Modules accepted: Orders, Medications

## 2016-01-16 LAB — IRON AND TIBC
%SAT: 6 % — ABNORMAL LOW (ref 11–50)
IRON: 25 ug/dL — AB (ref 45–160)
TIBC: 454 ug/dL — ABNORMAL HIGH (ref 250–450)
UIBC: 429 ug/dL — ABNORMAL HIGH (ref 125–400)

## 2016-01-16 LAB — FOLATE: Folate: 5.9 ng/mL (ref >5.4)

## 2016-01-16 LAB — VITAMIN B12: VITAMIN B 12: 846 pg/mL (ref 200–1100)

## 2016-01-16 LAB — FERRITIN: FERRITIN: 20 ng/mL (ref 20–288)

## 2016-01-19 ENCOUNTER — Ambulatory Visit (INDEPENDENT_AMBULATORY_CARE_PROVIDER_SITE_OTHER): Payer: Medicare Other | Admitting: Physician Assistant

## 2016-01-19 ENCOUNTER — Encounter: Payer: Self-pay | Admitting: Physician Assistant

## 2016-01-19 VITALS — BP 94/59 | HR 76 | Temp 98.0°F | Resp 18 | Wt 140.0 lb

## 2016-01-19 DIAGNOSIS — G629 Polyneuropathy, unspecified: Secondary | ICD-10-CM | POA: Diagnosis not present

## 2016-01-19 DIAGNOSIS — I5022 Chronic systolic (congestive) heart failure: Secondary | ICD-10-CM | POA: Diagnosis not present

## 2016-01-19 DIAGNOSIS — E1149 Type 2 diabetes mellitus with other diabetic neurological complication: Secondary | ICD-10-CM

## 2016-01-19 DIAGNOSIS — I2510 Atherosclerotic heart disease of native coronary artery without angina pectoris: Secondary | ICD-10-CM | POA: Diagnosis not present

## 2016-01-19 LAB — BASIC METABOLIC PANEL WITH GFR
BUN: 25 mg/dL (ref 7–25)
CALCIUM: 9.5 mg/dL (ref 8.6–10.4)
CHLORIDE: 95 mmol/L — AB (ref 98–110)
CO2: 32 mmol/L — AB (ref 20–31)
CREATININE: 1.12 mg/dL — AB (ref 0.60–0.93)
GFR, Est African American: 54 mL/min — ABNORMAL LOW (ref >=60)
GFR, Est Non African American: 47 mL/min — ABNORMAL LOW (ref >=60)
Glucose, Bld: 149 mg/dL — ABNORMAL HIGH (ref 70–99)
Potassium: 4.7 mmol/L (ref 3.5–5.3)
SODIUM: 136 mmol/L (ref 135–146)

## 2016-01-19 MED ORDER — FUROSEMIDE 80 MG PO TABS: 80.0000 mg | ORAL_TABLET | Freq: Every day | ORAL | Status: DC

## 2016-01-19 MED ORDER — POTASSIUM CHLORIDE CRYS ER 20 MEQ PO TBCR: 20.0000 meq | EXTENDED_RELEASE_TABLET | Freq: Every day | ORAL | Status: DC

## 2016-01-19 NOTE — Progress Notes (Signed)
Patient ID: April Short MRN: [id number], DOB: 1934-10-02, 81 y.o. Date of Encounter: @DATE @  Chief Complaint:  Chief Complaint  Patient presents with  . 1 week follow up    doing better, down 15 lbs, more alert, eating better,  c/o hands feel sticky    HPI: 81 y.o. year old female  presents with her daughter for OV today.    01/14/2016: She is here as a new patient to establish care with our practice. I do not have records yet. The following history is provided by her daughter today. Pt does not speak much English. Daughter translates/interprets.   Daughter states the patient just recently moved here from [place]--2 months ago. She was living with her other daughter in [place]. She reports that she recently got 2 stents to her heart February 2017 and at that time was told that she also has congestive heart failure. Says the prior to that, patient was independent so the other sister could care for her. She states that now, since these cardiac issues, she is not as independent --so it was decided that she should come live with this daughter. This daughter states that her own husband passed away several months ago and she does not work so it made sense for patient to move with her.  She does have the card given to them from the stent manufacturers. She received a stent to the OM and a stent to the circumflex on 10/31/15. Daughter states that "her heart is pumping less than 50% "  She says the patient had colon cancer in 2001 and had surgery. Says that she had cervical cancer in 2004 and had a complete hysterectomy. Says that she has had diabetes for 23 years. ------ notes that metformin was stopped secondary to GI adverse effects in the past  Says that her mom made it through all of these other medical problems and remained independent until these issues with her heart recently.  States that patient developed swelling and shortness of breath and went to her primary care  physician in [place] and she then was set up with cardiologist and had a nuclear test, then cath with stents.  Daughter also notes that patient had been on lisinopril but then was switched to [facility].  They report that when living in [place], she saw her Primary Care Physician, Cardiology, Podiatry. Had no other specialists. Says that she saw a podiatrist for diabetic foot care-- nails clipped  etc. secondary to her diabetes. Had no other specific problems with her feet.  Also reports the patient has significant diabetic neuropathy and that it is mostly affects her hands. Says that she "can do nothing with her hands ".  ---------TO F/U AT NEXT OV---DEPRESSION SCREEN INDICATES DEPRESSION----WILL DISCUSS THIS AT F/U OV--------------- She marked "Nearly every day" for the following: Little interest or pleasure in doing things Feeling down, depressed, or hopeless Trouble falling or staying asleep, or sleeping too much Feeling tired or having little energy Poor appetite or overeating Thoughts that she would be better off dead or of hurting yourself in some way    AT THAT OV, CHECKED LABS INCLUDING BNP.  BNP came back elevated at 2185.  BUN 22. Creatinine 1.03.  At that time:  Increased Lasix from 40mg  QD to 80mg  BID (morning and noon)  Added new--KDur 56meq BID (morning and noon)  --had been on no potassium supplement prior to that---on Spironolactone Told to f/u Monday (01/19/2016)   01/19/2016: Today reports  that she is feeling so much better. Says that the swelling has decreased a lot. Says that she had the best weekend she has had since she came from [place]. Says that she was so much more alert, has been sleeping better, eating better. Daughter says that she weighs patient daily and she has decreased 15 pounds. Says that she is walking more. Says that her cough has almost disappeared. Says that before last week's visit-- when she would walk from the chair to the refrigerator, she  was short of breath and breathing hard. Says now she can walk that distance with no problems. Daughter states the patient sleeps with a cushion and then a pillow on top of that and is propped up that way. Says that she has continued this. However is not coughing at night now. Daughter has noticed that patient's blood pressure is lower now but pt says she is not feeling lightheaded and is not having any symptoms with this. Daughter shows me readings that she checks on patient every day. Every day she checks-- weight, blood sugar, blood pressure, and oxygen saturation.  They report that they have not yet gotten phone call about appt to see Cardiology yet.     No past medical history on file.   Home Meds:    Medication List       .               aspirin EC 81 MG tablet  Take 81 mg by mouth daily.     BRILINTA 90 MG Tabs tablet  Generic drug:  ticagrelor  Take by mouth 2 (two) times daily.     carvedilol 6.25 MG tablet  Commonly known as:  COREG  Take 6.25 mg by mouth 2 (two) times daily with a meal.     DIABETIC TUSSIN MAX ST PO  Take by mouth as needed.     ENTRESTO 24-26 MG  Generic drug:  sacubitril-valsartan  Take 1 tablet by mouth 2 (two) times daily.     furosemide 40 MG tablet  Commonly known as:  LASIX  Take 40 mg by mouth daily.     glipiZIDE 10 MG 24 hr tablet  Commonly known as:  GLUCOTROL XL  Take 10 mg by mouth daily with breakfast.     polyethylene glycol packet  Commonly known as:  MIRALAX / GLYCOLAX  Take 17 g by mouth daily as needed.     spironolactone 25 MG tablet  Commonly known as:  ALDACTONE  Take 25 mg by mouth daily.           Allergies: No Known Allergies  Social History   Social History  . Marital Status: Married    Spouse Name: N/A  . Number of Children: N/A  . Years of Education: N/A   Occupational History  . Not on file.   Social History Main Topics  . Smoking status: Never Smoker   . Smokeless tobacco: Never Used    . Alcohol Use: 0.0 oz/week    0 Standard drinks or equivalent per week     Comment: occasionally  . Drug Use: No  . Sexual Activity: Not on file   Other Topics Concern  . Not on file   Social History Narrative    No family history on file.   Review of Systems:  See HPI for pertinent ROS. All other ROS negative.    Physical Exam: Blood pressure 94/59, pulse 76, temperature 98 F (36.7 C), temperature source  Oral, resp. rate 18, weight 140 lb (63.504 kg)., Body mass index is 25.6 kg/(m^2). General: WNWD Hispanic Female. Does not speak [place]. Says very little during the visit--even with her daughter, who translates/interprets. Flat affect. Appears in no acute distress. Neck: Supple. No thyromegaly. No lymphadenopathy. Lungs: Clear. Heart: RRR with S1 S2. No murmurs, rubs, or gallops. Abdomen: Soft, non-tender, non-distended with normoactive bowel sounds. No hepatomegaly. No rebound/guarding. No obvious abdominal masses. Musculoskeletal:  Strength and tone normal for age. Extremities/Skin: Diabetic Foot Exam: Inspection normal. Very good skin care. No problem areas at all.  She still has 1+ - 2+ pedal pitting edema bilaterally, but this is much improved compared to LOV.  Neuro: Alert and oriented X 3. Moves all extremities spontaneously. Gait is normal. CNII-XII grossly in tact. Psych:  Responds to questions appropriately with a normal affect.     ASSESSMENT AND PLAN:  81 y.o. year old female with  1. Encounter to establish care We currently have no records. At initial Ov 01/14/2016-- she signed a medical release form for her prior primary care provider as well as her cardiologist. At initial [place] 01/14/2016---Ordered Referral to Cardiology  2. DM (diabetes mellitus), type 2 with neurological complications ([place]) Note the daughter states that in the past metformin was stopped secondary to GI adverse effects. ACE [place] She is on no statin. She  is not fasting today so we will have to wait and check lipid panel at future visit. Also will follow up records from [place] and see if there is a reason that she is on no statin.  -----------------01/14/2016---checked A1C---came back at 9.2 --------------------------------------------------- ----------------Once we get her CHF stable, then I will f/u this with pt at an OV-------------------------  3. Coronary artery disease involving native coronary artery of native heart without angina pectoris This is currently stable. This will be managed by cardiology. -01/14/2016--Ordered referral to Cardiology  4. Chronic systolic heart failure (HCC) At her initial OV--01/14/2016--was on Lasix 40mg  QD (was on no K--was on spironolactone) At that time, when reviewed BNP---increased to 80mg  BID (added K67meq BID [in addition to spironolactone] )  Weight 01/14/2016-----155 Weight 01/19/2016-------140  At OV 01/19/2016---changed dose to 80mg  QD (QAM only) and to take KDur 75meq QD with this.  To schedule f/u OV 1 week.   She has not heard from Cardiology.  Will f/u with making sure she does get appointment with Cardiology by her next OV here.   5. Neuropathy ([place]) She is on no medication for neuropathy. Will follow-up with this in the future.  6. Anemia Lab 01/14/16 showed H/H    9.1/30.6. MCV and MCH low.  Anemia Panel added.  Iron low at 25. %sat low at 6.  B12--normal.  Folate--normal --------------I have not addressed this yet. ----------------------------------------------------- --------------Will add Iron at f/u OV----once we get her CHF stable. -----------------------   F/U OV 1 week. F/U sooner if needed.   Signed, [address] [place], [place], [facility] 01/19/2016 11:21 AM

## 2016-01-20 LAB — BRAIN NATRIURETIC PEPTIDE: BRAIN NATRIURETIC PEPTIDE: 1795.3 pg/mL — AB (ref <100)

## 2016-01-21 ENCOUNTER — Encounter: Payer: Self-pay | Admitting: Cardiovascular Disease

## 2016-01-21 ENCOUNTER — Ambulatory Visit (INDEPENDENT_AMBULATORY_CARE_PROVIDER_SITE_OTHER): Payer: Medicare Other | Admitting: Cardiovascular Disease

## 2016-01-21 VITALS — BP 96/54 | HR 80 | Ht 63.0 in | Wt 136.0 lb

## 2016-01-21 DIAGNOSIS — E785 Hyperlipidemia, unspecified: Secondary | ICD-10-CM

## 2016-01-21 DIAGNOSIS — I5189 Other ill-defined heart diseases: Secondary | ICD-10-CM

## 2016-01-21 DIAGNOSIS — I519 Heart disease, unspecified: Secondary | ICD-10-CM | POA: Diagnosis not present

## 2016-01-21 DIAGNOSIS — I5022 Chronic systolic (congestive) heart failure: Secondary | ICD-10-CM | POA: Diagnosis not present

## 2016-01-21 DIAGNOSIS — I2510 Atherosclerotic heart disease of native coronary artery without angina pectoris: Secondary | ICD-10-CM | POA: Diagnosis not present

## 2016-01-21 NOTE — Patient Instructions (Signed)
Medication Instructions:  Your physician recommends that you continue on your current medications as directed. Please refer to the Current Medication list given to you today.   Labwork: Your physician recommends that you return for lab work in [facility] [place].   Testing/Procedures: Your physician has requested that you have an echocardiogram. Echocardiography is a painless test that uses sound waves to create images of your heart. It provides your doctor with information about the size and shape of your heart and how well your heart's chambers and valves are working. This procedure takes approximately one hour. There are no restrictions for this procedure.    Follow-Up: We request that you follow-up in: 3 MONTHS with an extender and in 6 MONTHS with Dr Andria Rhein will receive a reminder letter in the mail two months in advance. If you don't receive a letter, please call our office to schedule the follow-up appointment.    Any Other Special Instructions Will Be Listed Below (If Applicable).     If you need a refill on your cardiac medications before your next appointment, please call your pharmacy.

## 2016-01-21 NOTE — Progress Notes (Signed)
01/21/2016 April Short   09/07/1935  [id number]  Primary Physician Karis Juba, PA-C Primary Cardiologist: Lorretta Harp MD Renae Gloss   HPI:  April Short is a delightful 81 year old widowed Latino female baby by her daughter April Short today. She was referred by Karis Juba for cardiac evaluation to be established in our practice. She recently relocated from [place] to [place] be closer to family. She does have a history of diabetes. She had 2 drug-eluting stents placed in her circumflex and obtuse marginal branch and [place] 10/31/15 with a 2.5 x 30 mm long resolute stent placement in obtuse marginal branch and a 3.5 mm x 12 mm long placed in the circumflex. She is on appropriate medicines for systolic heart failure. She recently lost a significant amount of weight secondary to diuresis. She isambulatory but really does not leave the house. She denies chest pain. She does have some dyspnea on exertion. She is on dual therapy with Brilenta.   Current Outpatient Prescriptions  Medication Sig Dispense Refill  . aspirin EC 81 MG tablet Take 81 mg by mouth daily.    . carvedilol (COREG) 6.25 MG tablet Take 6.25 mg by mouth 2 (two) times daily with a meal.    . Dextromethorphan-Guaifenesin (DIABETIC TUSSIN MAX ST PO) Take by mouth as needed. Reported on 01/19/2016    . furosemide (LASIX) 80 MG tablet Take 1 tablet (80 mg total) by mouth daily. 30 tablet 3  . glipiZIDE (GLUCOTROL XL) 10 MG 24 hr tablet Take 10 mg by mouth daily with breakfast.    . polyethylene glycol (MIRALAX / GLYCOLAX) packet Take 17 g by mouth daily as needed.    . potassium chloride SA (K-DUR,KLOR-CON) 20 MEQ tablet Take 1 tablet (20 mEq total) by mouth daily. 30 tablet 3  . sacubitril-valsartan (ENTRESTO) 24-26 MG Take 1 tablet by mouth 2 (two) times daily. 60 tablet 5  . spironolactone (ALDACTONE) 25 MG tablet Take 25 mg by mouth daily.    . ticagrelor (BRILINTA) 90 MG TABS tablet Take by  mouth 2 (two) times daily.     No current facility-administered medications for this visit.    No Known Allergies  Social History   Social History  . Marital Status: Married    Spouse Name: N/A  . Number of Children: N/A  . Years of Education: N/A   Occupational History  . Not on file.   Social History Main Topics  . Smoking status: Never Smoker   . Smokeless tobacco: Never Used  . Alcohol Use: 0.0 oz/week    0 Standard drinks or equivalent per week     Comment: occasionally  . Drug Use: No  . Sexual Activity: Not on file   Other Topics Concern  . Not on file   Social History Narrative     Review of Systems: General: negative for chills, fever, night sweats or weight changes.  Cardiovascular: negative for chest pain, dyspnea on exertion, edema, orthopnea, palpitations, paroxysmal nocturnal dyspnea or shortness of breath Dermatological: negative for rash Respiratory: negative for cough or wheezing Urologic: negative for hematuria Abdominal: negative for nausea, vomiting, diarrhea, bright red blood per rectum, melena, or hematemesis Neurologic: negative for visual changes, syncope, or dizziness All other systems reviewed and are otherwise negative except as noted above.    Blood pressure 96/54, pulse 80, height 5\' 3"  (1.6 m), weight 136 lb (61.689 kg).  General appearance: alert and no distress Neck: no adenopathy, no carotid bruit, no  JVD, supple, symmetrical, trachea midline and thyroid not enlarged, symmetric, no tenderness/mass/nodules Lungs: clear to auscultation bilaterally Heart: regular rate and rhythm, S1, S2 normal, no murmur, click, rub or gallop Extremities: extremities normal, atraumatic, no cyanosis or edema  EKG sinus rhythm at 80 with left bundle branch block and occasional PVCs. I personally reviewed this EKG  ASSESSMENT AND PLAN:   Coronary artery disease History of CAD status post PCI and drug-eluting stenting at [facility] in [place]  10/31/15 with a resolute 2.5 mm x 30 mm long drug-eluting stent placement in obtuse marginal branch and a 3.5 mm x 12 mm long placement in the circumflex. She is on dual antibiotic therapy including aspirin and Brilenta. She denies chest pain.  Chronic systolic heart failure (HCC) History of systolic heart failure currently on appropriate medications including carvedilol, and chest,spironolactone and furosemide. She appears to be close to her dry weight. Her lungs are clear. She has no jugular venous distention. She has 1+ lower extremity edema. I reinforced the importance of salt avoidance Her primary care physician is following her electrolytes. I am going to get a 2-D echo for LV function.      Lorretta Harp MD FACP,FACC,FAHA, [facility] 01/21/2016 9:44 AM

## 2016-01-21 NOTE — Assessment & Plan Note (Signed)
History of CAD status post PCI and drug-eluting stenting at [facility] in [place] 10/31/15 with a resolute 2.5 mm x 30 mm long drug-eluting stent placement in obtuse marginal branch and a 3.5 mm x 12 mm long placement in the circumflex. She is on dual antibiotic therapy including aspirin and Brilenta. She denies chest pain.

## 2016-01-21 NOTE — Assessment & Plan Note (Signed)
History of systolic heart failure currently on appropriate medications including carvedilol, and chest,spironolactone and furosemide. She appears to be close to her dry weight. Her lungs are clear. She has no jugular venous distention. She has 1+ lower extremity edema. I reinforced the importance of salt avoidance Her primary care physician is following her electrolytes. I am going to get a 2-D echo for LV function.

## 2016-01-22 ENCOUNTER — Ambulatory Visit: Payer: Medicare Other | Admitting: Physician Assistant

## 2016-01-22 DIAGNOSIS — I2510 Atherosclerotic heart disease of native coronary artery without angina pectoris: Secondary | ICD-10-CM | POA: Diagnosis not present

## 2016-01-22 DIAGNOSIS — E785 Hyperlipidemia, unspecified: Secondary | ICD-10-CM | POA: Diagnosis not present

## 2016-01-22 LAB — HEPATIC FUNCTION PANEL
ALT: 7 U/L (ref 6–29)
AST: 16 U/L (ref 10–35)
Albumin: 3.8 g/dL (ref 3.6–5.1)
Alkaline Phosphatase: 119 U/L (ref 33–130)
BILIRUBIN DIRECT: 0.4 mg/dL — AB (ref <=0.2)
Indirect Bilirubin: 0.6 mg/dL (ref 0.2–1.2)
Total Bilirubin: 1 mg/dL (ref 0.2–1.2)
Total Protein: 7.3 g/dL (ref 6.1–8.1)

## 2016-01-22 LAB — LIPID PANEL
CHOLESTEROL: 135 mg/dL (ref 125–200)
HDL: 39 mg/dL — AB (ref >=46)
LDL CALC: 81 mg/dL (ref <130)
TRIGLYCERIDES: 76 mg/dL (ref <150)
Total CHOL/HDL Ratio: 3.5 Ratio (ref <=5.0)
VLDL: 15 mg/dL (ref <30)

## 2016-01-26 ENCOUNTER — Ambulatory Visit (INDEPENDENT_AMBULATORY_CARE_PROVIDER_SITE_OTHER): Payer: Medicare Other | Admitting: Physician Assistant

## 2016-01-26 ENCOUNTER — Encounter: Payer: Self-pay | Admitting: Physician Assistant

## 2016-01-26 VITALS — BP 82/50 | HR 76 | Temp 97.7°F | Resp 18 | Wt 133.0 lb

## 2016-01-26 DIAGNOSIS — E1149 Type 2 diabetes mellitus with other diabetic neurological complication: Secondary | ICD-10-CM

## 2016-01-26 DIAGNOSIS — G629 Polyneuropathy, unspecified: Secondary | ICD-10-CM

## 2016-01-26 DIAGNOSIS — I2510 Atherosclerotic heart disease of native coronary artery without angina pectoris: Secondary | ICD-10-CM | POA: Diagnosis not present

## 2016-01-26 DIAGNOSIS — D509 Iron deficiency anemia, unspecified: Secondary | ICD-10-CM | POA: Insufficient documentation

## 2016-01-26 DIAGNOSIS — I5022 Chronic systolic (congestive) heart failure: Secondary | ICD-10-CM

## 2016-01-26 DIAGNOSIS — I2583 Coronary atherosclerosis due to lipid rich plaque: Secondary | ICD-10-CM

## 2016-01-26 MED ORDER — SITAGLIPTIN PHOSPHATE 100 MG PO TABS: 100.0000 mg | ORAL_TABLET | Freq: Every day | ORAL | Status: DC

## 2016-01-26 NOTE — Progress Notes (Signed)
Patient ID: Jasmond Reinders MRN: [id number], DOB: [birth year], 81 y.o. Date of Encounter: @DATE @  Chief Complaint:  Chief Complaint  Patient presents with  . 1 week check   AT OV 01/26/2016----ORDERED : REFERRAL TO PODIATRY---------she saw podiatry in [facility] "to clip her nails" --wants f/u with podiatry for this. BMET HEME OCCULT X3 JANUVIA 100MG  QD  SHE IS TO COME FASTING TO NEXT OV SO CAN CHECK FLP------SHE IS ON NO STATIN------- F/U OV 1 week. F/U sooner if needed.   HPI: 81 y.o. year old female  presents with her daughter for OV today.    01/14/2016: She is here as a new patient to establish care with our practice. I do not have records yet. The following history is provided by her daughter today. Pt does not speak much English. Daughter translates/interprets.   Daughter states the patient just recently moved here from [place]--2 months ago. She was living with her other daughter in [place]. She reports that she recently got 2 stents to her heart February 2017 and at that time was told that she also has congestive heart failure. Says the prior to that, patient was independent so the other sister could care for her. She states that now, since these cardiac issues, she is not as independent --so it was decided that she should come live with this daughter. This daughter states that her own husband passed away several months ago and she does not work so it made sense for patient to move with her.  She does have the card given to them from the stent manufacturers. She received a stent to the OM and a stent to the circumflex on 10/31/15. Daughter states that "her heart is pumping less than 50% "  She says the patient had colon cancer in 2001 and had surgery. Says that she had cervical cancer in 2004 and had a complete hysterectomy. Says that she has had diabetes for 23 years. as a new patient to establish care with our practice. I do not have records yet. The following history is provided by her daughter today. Pt does not speak much English. Daughter translates/interprets.   Daughter states the patient just recently moved here from [place]--2 months ago. She was living with her other daughter in [place]. She reports that she recently got 2 stents to her heart February 2017 and at that time was told that she also has congestive heart failure. Says the prior to that, patient was independent so the other sister could care for her. She states that now, since these cardiac issues, she is not as independent --so it was decided that she should come live with this daughter. This daughter states that her own husband passed away several months ago and she does not work so it made sense for patient to move with her.  She does have the card given to them from the stent manufacturers. She received a stent to the OM and a stent to the circumflex on 10/31/15. Daughter states that "her heart is pumping less than 50% "  She says the patient had colon cancer in 2001 and had surgery. Says that she had cervical cancer in 2004 and had a complete hysterectomy. Says that she has had diabetes for 23 years. ------ notes that metformin was stopped secondary to GI adverse effects in the past  Says that her  mom made it through all of these other medical problems and remained independent until these issues with her heart recently.  States that patient developed swelling and shortness of breath and went to her primary care physician in [place] and she then was set up with cardiologist and had a nuclear test, then cath with stents.  Daughter also notes that patient had been on lisinopril but then was switched to [facility].  They report that when living in [place], she saw her Primary Care Physician, Cardiology, Podiatry. Had no other specialists. Says that she saw a podiatrist for diabetic foot care-- nails clipped  etc. secondary to her diabetes. Had no other specific problems with her feet.  Also reports the patient has significant diabetic neuropathy and that it is mostly affects her hands. Says that she "can do nothing with her hands ".   AT THAT OV, CHECKED LABS INCLUDING BNP.  BNP came back elevated at 2185.  BUN 22. Creatinine 1.03.  At that time:  Increased Lasix from 40mg  QD to 80mg  BID (morning and noon)  Added new--KDur 53meq BID (morning and noon)  --had been on no potassium supplement prior to that---on Spironolactone Told to f/u Monday (01/19/2016)   01/19/2016: Today reports that she is feeling so much better. Says that the swelling has decreased a lot. Says that she had the best weekend she has had since she came from [place]. Says that  she was so much more alert, has been sleeping better, eating better. Daughter says that she weighs patient daily and she has decreased 15 pounds. Says that she is walking more. Says that her cough has almost disappeared. Says that before last week's visit-- when she would walk from the chair to the refrigerator, she was short of breath and breathing hard. Says now she can walk that distance with no problems. Daughter states the patient sleeps with a cushion and then a pillow on top of that and is propped up that way. Says that she has continued  this. However is not coughing at night now. Daughter has noticed that patient's blood pressure is lower now but pt says she is not feeling lightheaded and is not having any symptoms with this. Daughter shows me readings that she checks on patient every day. Every day she checks-- weight, blood sugar, blood pressure, and oxygen saturation.  AT THAT TIME: Told to take Lasix 80mg  QAM and KDur 34meq QAM. F/U 1 week.  01/26/2016:  She had OV with Cardiology--Dr. Berry--01/21/2016.  I reviewed his OV note.  He continued meds same with no change.  Felt that she was at dry weight. Weight that day was 136.  Lungs were clear. No JVD.  Today daughter says that Dr. Gwenlyn Found explained to them that she will always have some swelling. And that he told her to continue daily weights and to call if weight increases.  She is scheduled for Echo on May 17th.  Today they say she still feeling so much better. Says they "went shopping yesterday and she was walking instead of using wheelchair. Sleeping better now too". Daughter says that yesterday patient said "Lawrencia is back."  No complaint/concern today. Does want referral to Podiatrist--to clip her nails"--she saw Podiatrist for this in past in [place].  AT OV 01/26/2016----ORDERED : REFERRAL TO PODIATRY---------she saw podiatry in [facility] "to clip her nails" --wants f/u with podiatry for this. BMET HEME OCCULT X3 JANUVIA 100MG  QD  SHE IS TO COME FASTING TO NEXT OV SO CAN CHECK FLP------SHE IS ON NO STATIN------- F/U OV 1 week. F/U sooner if needed.    No past medical history on file.   Home Meds:    Medication List       .               aspirin EC 81 MG tablet  Take 81 mg by mouth daily.     BRILINTA 90 MG Tabs tablet  Generic drug:  ticagrelor  Take by mouth 2 (two) times daily.     carvedilol 6.25 MG tablet  Commonly known as:  COREG  Take 6.25 mg by mouth 2 (two) times daily with a meal.     DIABETIC TUSSIN MAX ST PO  Take by  mouth as needed.     ENTRESTO 24-26 MG  Generic drug:  sacubitril-valsartan  Take 1 tablet by mouth 2 (two) times daily.     furosemide 40 MG tablet  Commonly known as:  LASIX  Take 40 mg by mouth daily.     glipiZIDE 10 MG 24 hr tablet  Commonly known as:  GLUCOTROL XL  Take 10 mg by mouth daily with breakfast.     polyethylene glycol packet  Commonly known as:  MIRALAX / GLYCOLAX  Take 17 g by mouth daily as needed.     spironolactone 25 MG tablet  Commonly known as:  ALDACTONE  Take 25 mg by mouth daily.  Allergies: No Known Allergies  Social History   Social History  . Marital Status: Married    Spouse Name: N/A  . Number of Children: N/A  . Years of Education: N/A   Occupational History  . Not on file.   Social History Main Topics  . Smoking status: Never Smoker   . Smokeless tobacco: Never Used  . Alcohol Use: 0.0 oz/week    0 Standard drinks or equivalent per week     Comment: occasionally  . Drug Use: No  . Sexual Activity: Not on file   Other Topics Concern  . Not on file   Social History Narrative    No family history on file.   Review of Systems:  See HPI for pertinent ROS. All other ROS negative.    Physical Exam: Blood pressure 82/50, pulse 76, temperature 97.7 F (36.5 C), temperature source Oral, resp. rate 18, weight 133 lb (60.328 kg)., Body mass index is 23.57 kg/(m^2). General: WNWD Hispanic Female. Does not speak [place]. She is smiling today!! (At her first OV here, had very flat affect) Appears in no acute distress. Neck: Supple. No thyromegaly. No lymphadenopathy. No JVD. No carotid bruits. Lungs: Clear. Heart: RRR with S1 S2. No murmurs, rubs, or gallops. Abdomen: Soft, non-tender, non-distended with normoactive bowel sounds. No hepatomegaly. No rebound/guarding. No obvious abdominal masses. Musculoskeletal:  Strength and tone normal for age. Extremities/Skin: Diabetic Foot Exam: Inspection normal. Very good skin  care. No problem areas at all.  She still has 1+ - 2+ pedal pitting edema bilaterally, but this is much improved compared to prior OV.  Neuro: Alert and oriented X 3. Moves all extremities spontaneously. Gait is normal. CNII-XII grossly in tact. Psych:  Responds to questions appropriately with a normal affect.     ASSESSMENT AND PLAN:  81 y.o. year old female with  1. Encounter to establish care We currently have no records. At initial Ov 01/14/2016-- she signed a medical release form for her prior primary care provider as well as her cardiologist. I have not received any records yet--as of 01/26/2016----  2. DM (diabetes mellitus), type 2 with neurological complications ([place]) Note the daughter states that in the past metformin was stopped secondary to GI adverse effects. ACE inhibitor was changed to ARB in [place] She is on no statin. She is not fasting today so we will have to wait and check lipid panel at future visit. Also will follow up records from [place] and see if there is a reason that she is on no statin.  -----------------01/14/2016---checked A1C---came back at 9.2 --------------------------------------------------- ----------------Once we get her CHF stable, then I will f/u this with pt at an OV------------------------- At OV 01/26/2016----Added Januvia 100mg  QD.   3. Coronary artery disease involving native coronary artery of native heart without angina pectoris This is currently stable. This will be managed by cardiology. -01/14/2016--Ordered referral to Cardiology She had OV with Cardiology--Dr. Berry--01/21/2016---he made no changes to meds. Ordered Echo--scheduled for May 17th.  4. Chronic systolic heart failure ([place]) At her initial OV--01/14/2016--was on Lasix 40mg  QD (was on no K--was on spironolactone) At that time, when reviewed BNP---increased to 80mg  BID (added K64meq BID [in addition to spironolactone] )  Weight 01/14/2016-----155 Weight 01/19/2016-------140 Weight  01/26/2016-------133  At OV 01/19/2016---changed dose to 80mg  QD (QAM only) and to take KDur 54meq QD with this.  At OV 01/26/2016---continued Lasix 80mg  QAM and KDur 30meq Q AM.  She had appointment with Cardiology--Dr. Berry---01/21/2016----He felt that she was at  her Dry Weight at that OV--weight 136 at that OV. He made no changes to meds. Ordered Echo, which is scheduled for May 17th.  5. Neuropathy [facility]) She is on no medication for neuropathy. Will follow-up with this in the future.  6. Anemia Lab 01/14/16 showed H/H    9.1/30.6. MCV and MCH low.  Anemia Panel added.  Iron low at 25. %sat low at 6.  B12--normal.  Folate--normal --------------I have not addressed this yet. ----------------------------------------------------- --------------Will  f/u ----once we get her CHF stable. ----------------------- AT OV 01/26/2016-----ORDERED HEME OCCULT x 3----------will f/u with these results then f/u / manage accordingly.    AT OV 01/26/2016----ORDERED : REFERRAL TO PODIATRY---------she saw podiatry in [facility] "to clip her nails" --wants f/u with podiatry for this. BMET HEME OCCULT X3 JANUVIA 100MG  QD  SHE IS TO COME FASTING TO NEXT OV SO CAN CHECK FLP------SHE IS ON NO STATIN------- F/U OV 1 week. F/U sooner if needed.   Signed, [address] [place], [place], [facility] 01/26/2016 11:27 AM

## 2016-01-27 ENCOUNTER — Encounter: Payer: Self-pay | Admitting: *Deleted

## 2016-01-27 LAB — BASIC METABOLIC PANEL WITH GFR
BUN: 32 mg/dL — ABNORMAL HIGH (ref 7–25)
CALCIUM: 9.5 mg/dL (ref 8.6–10.4)
CO2: 26 mmol/L (ref 20–31)
Chloride: 96 mmol/L — ABNORMAL LOW (ref 98–110)
Creat: 0.92 mg/dL (ref 0.60–0.93)
GFR, EST AFRICAN AMERICAN: 68 mL/min (ref >=60)
GFR, EST NON AFRICAN AMERICAN: 59 mL/min — AB (ref >=60)
Glucose, Bld: 171 mg/dL — ABNORMAL HIGH (ref 70–99)
Potassium: 4.9 mmol/L (ref 3.5–5.3)
SODIUM: 135 mmol/L (ref 135–146)

## 2016-02-02 ENCOUNTER — Encounter: Payer: Self-pay | Admitting: Physician Assistant

## 2016-02-02 ENCOUNTER — Ambulatory Visit (INDEPENDENT_AMBULATORY_CARE_PROVIDER_SITE_OTHER): Payer: Medicare Other | Admitting: Physician Assistant

## 2016-02-02 ENCOUNTER — Other Ambulatory Visit: Payer: Medicare Other

## 2016-02-02 VITALS — BP 90/56 | HR 96 | Temp 98.0°F | Resp 18 | Wt 129.0 lb

## 2016-02-02 DIAGNOSIS — I2510 Atherosclerotic heart disease of native coronary artery without angina pectoris: Secondary | ICD-10-CM | POA: Diagnosis not present

## 2016-02-02 DIAGNOSIS — D509 Iron deficiency anemia, unspecified: Secondary | ICD-10-CM

## 2016-02-02 DIAGNOSIS — G629 Polyneuropathy, unspecified: Secondary | ICD-10-CM

## 2016-02-02 DIAGNOSIS — I5022 Chronic systolic (congestive) heart failure: Secondary | ICD-10-CM | POA: Diagnosis not present

## 2016-02-02 DIAGNOSIS — E1149 Type 2 diabetes mellitus with other diabetic neurological complication: Secondary | ICD-10-CM

## 2016-02-02 DIAGNOSIS — I2583 Coronary atherosclerosis due to lipid rich plaque: Secondary | ICD-10-CM

## 2016-02-02 MED ORDER — CARVEDILOL 6.25 MG PO TABS: 6.2500 mg | ORAL_TABLET | Freq: Two times a day (BID) | ORAL | Status: DC

## 2016-02-02 NOTE — Progress Notes (Signed)
Patient ID: April Short MRN: [id number], DOB: [birth year], 81 y.o. Date of Encounter: @DATE @  Chief Complaint:  Chief Complaint  Patient presents with  . 1week follow up    cardiac tests Wednesday   HPI: 81 y.o. year old female  presents with her daughter for OV today.    01/14/2016: She is here as a new patient to establish care with our practice. I do not have records yet. The following history is provided by her daughter today. Pt does not speak much English. Daughter translates/interprets.   Daughter states the patient just recently moved here from [place]--2 months ago. She was living with her other daughter in [place]. She reports that she recently got 2 stents to her heart February 2017 and at that time was told that she also has congestive heart failure. Says the prior to that, patient was independent so the other sister could care for her. She states that now, since these cardiac issues, she is not as independent --so it was decided that she should come live with this daughter. This daughter states that her own husband passed away several months ago and she does not work so it made sense for patient to move with her.  She does have the card given to them from the stent manufacturers. She received a stent to the OM and a stent to the circumflex on 10/31/15. Daughter states that "her heart is pumping less than 50% "  She says the patient had colon cancer in 2001 and had surgery. Says that she had cervical cancer in 2004 and had a complete hysterectomy. Says that she has had diabetes for 23 years. ------ notes that metformin was stopped secondary to GI adverse effects in the past  Says that her mom made it through all of these other medical problems and remained independent until these issues with her heart recently.  States that patient developed swelling and shortness of breath and went to her primary care physician in [place] and she then was set up with  cardiologist and had a nuclear test, then cath with stents.  Daughter also notes that patient had been on lisinopril but then was switched to [facility].  They report that when living in [place], she saw her Primary Care Physician, Cardiology, Podiatry. Had no other specialists. Says that she saw a podiatrist for diabetic foot care-- nails clipped  etc. secondary to her diabetes. Had no other specific problems with her feet.  Also reports the patient has significant diabetic neuropathy and that it is mostly affects her hands. Says that she "can do nothing with her hands ".   AT THAT OV, CHECKED LABS INCLUDING BNP.  BNP came back elevated at 2185.  BUN 22. Creatinine 1.03.  At that time:  Increased Lasix from 40mg  QD to 80mg  BID (morning and noon)  Added new--KDur 54meq BID (morning and noon)  --had been on no potassium supplement prior to that---on Spironolactone Told to f/u Monday (01/19/2016)   01/19/2016: Today reports that she is feeling so much better. Says that the swelling has decreased a lot. Says that she had the best weekend she has had since she came from [place]. Says that she was so much more alert, has been sleeping better, eating better. Daughter says that she weighs patient daily and she has decreased 15 pounds. Says that she is walking more. Says that her cough has almost disappeared. Says that before last week's visit-- when she would walk from  the chair to the refrigerator, she was short of breath and breathing hard. Says now she can walk that distance with no problems. Daughter states the patient sleeps with a cushion and then a pillow on top of that and is propped up that way. Says that she has continued this. However is not coughing at night now. Daughter has noticed that patient's blood pressure is lower now but pt says she is not feeling lightheaded and is not having any symptoms with this. Daughter shows me readings that she checks on patient every day. Every day she  checks-- weight, blood sugar, blood pressure, and oxygen saturation.  AT THAT TIME: Told to take Lasix 80mg  QAM and KDur 33meq QAM. F/U 1 week.  01/26/2016:  She had OV with Cardiology--Dr. Berry--01/21/2016.  I reviewed his OV note.  He continued meds same with no change.  Felt that she was at dry weight. Weight that day was 136.  Lungs were clear. No JVD.  Today daughter says that Dr. Gwenlyn Found explained to them that she will always have some swelling. And that he told her to continue daily weights and to call if weight increases.  She is scheduled for Echo on May 17th.  Today they say she still feeling so much better. Says they "went shopping yesterday and she was walking instead of using wheelchair. Sleeping better now too". Daughter says that yesterday patient said "Dalton is back."  No complaint/concern today. Does want referral to Podiatrist--to clip her nails"--she saw Podiatrist for this in past in [place].  AT OV 01/26/2016----ORDERED : REFERRAL TO PODIATRY---------she saw podiatry in [facility] "to clip her nails" --wants f/u with podiatry for this. BMET HEME OCCULT X3 JANUVIA 100MG  QD  02/02/2016: Pt and daughter report she had wonderful Mother's Day. Was with multiple of her children (including her son, Lyndel Pleasure, who recently established care with me but unfortunately has gastric cancer and recently started hospice.  Turned in [facility] cards today. At this visit, I gave her contact info for her to schedule Diabetic eye exam.    No past medical history on file.   Home Meds:    Medication List       .               aspirin EC 81 MG tablet  Take 81 mg by mouth daily.     BRILINTA 90 MG Tabs tablet  Generic drug:  ticagrelor  Take by mouth 2 (two) times daily.     carvedilol 6.25 MG tablet  Commonly known as:  COREG  Take 6.25 mg by mouth 2 (two) times daily with a meal.     DIABETIC TUSSIN MAX ST PO  Take by mouth as needed.     ENTRESTO 24-26 MG    Generic drug:  sacubitril-valsartan  Take 1 tablet by mouth 2 (two) times daily.     furosemide 40 MG tablet  Commonly known as:  LASIX  Take 40 mg by mouth daily.     glipiZIDE 10 MG 24 hr tablet  Commonly known as:  GLUCOTROL XL  Take 10 mg by mouth daily with breakfast.     polyethylene glycol packet  Commonly known as:  MIRALAX / GLYCOLAX  Take 17 g by mouth daily as needed.     spironolactone 25 MG tablet  Commonly known as:  ALDACTONE  Take 25 mg by mouth daily.           Allergies: No Known Allergies  Social History  Social History  . Marital Status: Married    Spouse Name: N/A  . Number of Children: N/A  . Years of Education: N/A   Occupational History  . Not on file.   Social History Main Topics  . Smoking status: Never Smoker   . Smokeless tobacco: Never Used  . Alcohol Use: 0.0 oz/week    0 Standard drinks or equivalent per week     Comment: occasionally  . Drug Use: No  . Sexual Activity: Not on file   Other Topics Concern  . Not on file   Social History Narrative    No family history on file.   Review of Systems:  See HPI for pertinent ROS. All other ROS negative.    Physical Exam: Blood pressure 90/56, pulse 96, temperature 98 F (36.7 C), temperature source Oral, resp. rate 18, weight 129 lb (58.514 kg)., Body mass index is 22.86 kg/(m^2). General: WNWD Hispanic Female. Does not speak [place]. She is smiling today!! (At her first OV here, had very flat affect) Appears in no acute distress. Neck: Supple. No thyromegaly. No lymphadenopathy. No JVD. No carotid bruits. Lungs: Clear. Heart: RRR with S1 S2. No murmurs, rubs, or gallops. Abdomen: Soft, non-tender, non-distended with normoactive bowel sounds. No hepatomegaly. No rebound/guarding. No obvious abdominal masses. Musculoskeletal:  Strength and tone normal for age. Extremities/Skin: Diabetic Foot Exam: Inspection normal. Very good skin care. No problem areas at all.  She still  has 1+ - 2+ pedal pitting edema bilaterally, but this is much improved compared to prior OV.  Neuro: Alert and oriented X 3. Moves all extremities spontaneously. Gait is normal. CNII-XII grossly in tact. Psych:  Responds to questions appropriately with a normal affect.     ASSESSMENT AND PLAN:  81 y.o. year old female with  1. Encounter to establish care We currently have no records. At initial Ov 01/14/2016-- she signed a medical release form for her prior primary care provider as well as her cardiologist. I have not received any records yet--as of 02/02/2016----  2. DM (diabetes mellitus), type 2 with neurological complications ([place]) Note the daughter states that in the past metformin was stopped secondary to GI adverse effects. ACE inhibitor was changed to ARB in [place] She is on no statin. ---FLP performed 01/2016 shows LDL 81. Tri-76, HDL-39 -----------------01/14/2016---checked A1C---came back at 9.2 --------------------------------------------------- ----------------Once we get her CHF stable, then I will f/u this with pt at an OV------------------------- At OV 01/26/2016----Added Januvia 100mg  QD.  At [place] 5/8/017--Ordered referal to podiatry for foot care At [place] 02/02/2016--Gave contact info for Diabetic Eye Exam   Lipids --Good. 01/21/2016---Trig--76. HDL--39.  LDL--81  3. Coronary artery disease involving native coronary artery of native heart without angina pectoris This is currently stable. This will be managed by cardiology. -01/14/2016--Ordered referral to Cardiology She had OV with Cardiology--Dr. Berry--01/21/2016---he made no changes to meds. Ordered Echo--scheduled for May 17th.  4. Chronic systolic heart failure ([place]) At her initial OV--01/14/2016--was on Lasix 40mg  QD (was on no K--was on spironolactone) At that time, when reviewed BNP---increased to 80mg  BID (added K27meq BID [in addition to spironolactone] )  Weight 01/14/2016-----155 Weight 01/19/2016-------140 Weight  01/26/2016-------133  At OV 01/19/2016---changed dose to 80mg  QD (QAM only) and to take KDur 33meq QD with this.  At OV 01/26/2016---continued Lasix 80mg  QAM and KDur 67meq Q AM.  She had appointment with Cardiology--Dr. Berry---01/21/2016----He felt that she was at her Dry Weight at that OV--weight 136 at that OV. He made no changes to  meds. Ordered Echo, which is scheduled for May 17th.  5. Neuropathy [facility]) She is on no medication for neuropathy. Will follow-up with this in the future.  6. Anemia Lab 01/14/16 showed H/H    9.1/30.6. MCV and MCH low.  Anemia Panel added.  Iron low at 25. %sat low at 6.  B12--normal.  Folate--normal -[facility] cards turned in 02/02/2016---will f/u getting these results.   At this point, things are "caught up", stable---can wait 3 months for f/u OV.  ROV 3 months, sooner if needed.   [address] [place], [place], [facility] 02/02/2016 3:59 PM

## 2016-02-04 ENCOUNTER — Other Ambulatory Visit: Payer: Self-pay

## 2016-02-04 ENCOUNTER — Ambulatory Visit (HOSPITAL_COMMUNITY): Payer: Medicare Other | Attending: Cardiology

## 2016-02-04 ENCOUNTER — Ambulatory Visit: Payer: Self-pay | Admitting: Cardiovascular Disease

## 2016-02-04 DIAGNOSIS — I519 Heart disease, unspecified: Secondary | ICD-10-CM | POA: Insufficient documentation

## 2016-02-04 DIAGNOSIS — I5022 Chronic systolic (congestive) heart failure: Secondary | ICD-10-CM | POA: Diagnosis not present

## 2016-02-04 DIAGNOSIS — I272 Other secondary pulmonary hypertension: Secondary | ICD-10-CM | POA: Insufficient documentation

## 2016-02-04 DIAGNOSIS — I081 Rheumatic disorders of both mitral and tricuspid valves: Secondary | ICD-10-CM | POA: Diagnosis not present

## 2016-02-04 DIAGNOSIS — I5189 Other ill-defined heart diseases: Secondary | ICD-10-CM

## 2016-02-05 LAB — FECAL OCCULT BLOOD, IMMUNOCHEMICAL: FECAL OCCULT BLOOD: POSITIVE — AB

## 2016-02-09 ENCOUNTER — Telehealth: Payer: Self-pay | Admitting: Family Medicine

## 2016-02-09 DIAGNOSIS — K921 Melena: Secondary | ICD-10-CM

## 2016-02-09 DIAGNOSIS — D649 Anemia, unspecified: Secondary | ICD-10-CM

## 2016-02-09 NOTE — Telephone Encounter (Signed)
-----   Message from Orlena Sheldon, PA-C sent at 02/06/2016 11:17 AM EDT ----- Pt with significant anemia on lab. That is why [facility] performed. This shows blood in stool. Order Referral to GI--Urgent

## 2016-02-09 NOTE — Telephone Encounter (Signed)
Daughter aware of results and referral into [facility]

## 2016-02-20 DIAGNOSIS — H35373 Puckering of macula, bilateral: Secondary | ICD-10-CM | POA: Diagnosis not present

## 2016-02-20 DIAGNOSIS — E113293 Type 2 diabetes mellitus with mild nonproliferative diabetic retinopathy without macular edema, bilateral: Secondary | ICD-10-CM | POA: Diagnosis not present

## 2016-02-24 ENCOUNTER — Ambulatory Visit (INDEPENDENT_AMBULATORY_CARE_PROVIDER_SITE_OTHER): Payer: Medicare Other | Admitting: Sports Medicine

## 2016-02-24 ENCOUNTER — Encounter: Payer: Self-pay | Admitting: Sports Medicine

## 2016-02-24 DIAGNOSIS — E1142 Type 2 diabetes mellitus with diabetic polyneuropathy: Secondary | ICD-10-CM | POA: Diagnosis not present

## 2016-02-24 DIAGNOSIS — I739 Peripheral vascular disease, unspecified: Secondary | ICD-10-CM | POA: Diagnosis not present

## 2016-02-24 DIAGNOSIS — I2510 Atherosclerotic heart disease of native coronary artery without angina pectoris: Secondary | ICD-10-CM

## 2016-02-24 DIAGNOSIS — B351 Tinea unguium: Secondary | ICD-10-CM | POA: Diagnosis not present

## 2016-02-24 DIAGNOSIS — M79676 Pain in unspecified toe(s): Secondary | ICD-10-CM | POA: Diagnosis not present

## 2016-02-24 NOTE — Progress Notes (Signed)
Patient ID: April Short, female   DOB: Feb 22, 1935, 81 y.o.   MRN: [id number] Subjective: April Short is a 81 y.o. female patient with history of diabetes who presents to office today complaining of long, painful nails  while ambulating in shoes; unable to trim. Patient states that the glucose reading this morning was not recorded, 66 yesterday. Patient denies any new changes in medication or new problems. Patient denies any new cramping, numbness, burning or tingling in the legs.  Patient is assisted by daughter at this visit who is translating.   Patient Active Problem List   Diagnosis Date Noted  . Iron deficiency anemia 01/26/2016  . DM (diabetes mellitus), type 2 with neurological complications ([place]) 78/24/2353  . Coronary artery disease 01/14/2016  . Chronic systolic heart failure ([place]) 01/14/2016  . Neuropathy ([place]) 01/14/2016   Current Outpatient Prescriptions on File Prior to Visit  Medication Sig Dispense Refill  . aspirin EC 81 MG tablet Take 81 mg by mouth daily.    . carvedilol (COREG) 6.25 MG tablet Take 1 tablet (6.25 mg total) by mouth 2 (two) times daily with a meal. 180 tablet 2  . Dextromethorphan-Guaifenesin (DIABETIC TUSSIN MAX ST PO) Take by mouth as needed. Reported on 01/19/2016    . furosemide (LASIX) 80 MG tablet Take 1 tablet (80 mg total) by mouth daily. 30 tablet 3  . glipiZIDE (GLUCOTROL XL) 10 MG 24 hr tablet Take 10 mg by mouth daily with breakfast.    . polyethylene glycol (MIRALAX / GLYCOLAX) packet Take 17 g by mouth daily as needed.    . potassium chloride SA (K-DUR,KLOR-CON) 20 MEQ tablet Take 1 tablet (20 mEq total) by mouth daily. 30 tablet 3  . sacubitril-valsartan (ENTRESTO) 24-26 MG Take 1 tablet by mouth 2 (two) times daily. 60 tablet 5  . sitaGLIPtin (JANUVIA) 100 MG tablet Take 1 tablet (100 mg total) by mouth daily. 30 tablet 3  . spironolactone (ALDACTONE) 25 MG tablet Take 25 mg by mouth daily.    . ticagrelor (BRILINTA) 90 MG TABS tablet Take  by mouth 2 (two) times daily.     No current facility-administered medications on file prior to visit.   No Known Allergies  Recent Results (from the past 2160 hour(s))  CBC with Differential/Platelet     Status: Abnormal   Collection Time: 01/14/16  4:06 PM  Result Value Ref Range   WBC 7.1 3.8 - 10.8 K/uL   RBC 4.09 3.80 - 5.10 MIL/uL   Hemoglobin 9.1 (L) 12.0 - 15.0 g/dL   HCT 30.6 (L) 35.0 - 45.0 %   MCV 74.8 (L) 80.0 - 100.0 fL   MCH 22.2 (L) 27.0 - 33.0 pg   MCHC 29.7 (L) 32.0 - 36.0 g/dL   RDW 20.9 (H) 11.0 - 15.0 %   Platelets 284 140 - 400 K/uL   MPV 9.0 7.5 - 12.5 fL   Neutro Abs 5254 1500 - 7800 cells/uL   Lymphs Abs 781 (L) 850 - 3900 cells/uL   Monocytes Absolute 639 200 - 950 cells/uL   Eosinophils Absolute 355 15 - 500 cells/uL   Basophils Absolute 71 0 - 200 cells/uL   Neutrophils Relative % 74 %   Lymphocytes Relative 11 %   Monocytes Relative 9 %   Eosinophils Relative 5 %   Basophils Relative 1 %   Smear Review Criteria for review not met     Comment: ** Please note change in unit of measure and reference range(s). **  COMPLETE  METABOLIC PANEL WITH GFR     Status: Abnormal   Collection Time: 01/14/16  4:06 PM  Result Value Ref Range   Sodium 134 (L) 135 - 146 mmol/L   Potassium 4.9 3.5 - 5.3 mmol/L   Chloride 95 (L) 98 - 110 mmol/L   CO2 29 20 - 31 mmol/L   Glucose, Bld 228 (H) 70 - 99 mg/dL   BUN 22 7 - 25 mg/dL   Creat 1.03 (H) 0.60 - 0.93 mg/dL   Total Bilirubin 1.2 0.2 - 1.2 mg/dL   Alkaline Phosphatase 114 33 - 130 U/L   AST 13 10 - 35 U/L   ALT 7 6 - 29 U/L   Total Protein 7.0 6.1 - 8.1 g/dL   Albumin 3.3 (L) 3.6 - 5.1 g/dL   Calcium 8.7 8.6 - 10.4 mg/dL   GFR, Est African American 60 >=60 mL/min   GFR, Est Non African American 52 (L) >=60 mL/min    Comment:   The estimated GFR is a calculation valid for adults (>=75 years old) that uses the CKD-EPI algorithm to adjust for age and sex. It is   not to be used for children, pregnant women,  hospitalized patients,    patients on dialysis, or with rapidly changing kidney function. According to the NKDEP, eGFR >89 is normal, 60-89 shows mild impairment, 30-59 shows moderate impairment, 15-29 shows severe impairment and <15 is ESRD.     Hemoglobin A1c     Status: Abnormal   Collection Time: 01/14/16  4:06 PM  Result Value Ref Range   Hgb A1c MFr Bld 9.2 (H) <5.7 %    Comment:   For someone without known diabetes, a hemoglobin A1c value of 6.5% or greater indicates that they may have diabetes and this should be confirmed with a follow-up test.   For someone with known diabetes, a value <7% indicates that their diabetes is well controlled and a value greater than or equal to 7% indicates suboptimal control. A1c targets should be individualized based on duration of diabetes, age, comorbid conditions, and other considerations.   Currently, no consensus exists for use of hemoglobin A1c for diagnosis of diabetes for children.      Mean Plasma Glucose 217 mg/dL  Brain natriuretic peptide     Status: Abnormal   Collection Time: 01/14/16  4:06 PM  Result Value Ref Range   Brain Natriuretic Peptide 2185.4 (H) <100 pg/mL    Comment:   BNP levels increase with age in the general population with the highest values seen in individuals greater than 17 years of age. Reference: Joellyn Rued Cardiol 2002; 41:423-95.     Iron and TIBC     Status: Abnormal   Collection Time: 01/14/16  4:06 PM  Result Value Ref Range   Iron 25 (L) 45 - 160 ug/dL   UIBC 429 (H) 125 - 400 ug/dL   TIBC 454 (H) 250 - 450 ug/dL   %SAT 6 (L) 11 - 50 %  Vitamin B12     Status: None   Collection Time: 01/14/16  4:06 PM  Result Value Ref Range   Vitamin B-12 846 200 - 1100 pg/mL  Folate     Status: None   Collection Time: 01/14/16  4:06 PM  Result Value Ref Range   Folate 5.9 >5.4 ng/mL    Comment: Reference Range >17 years:   Low: <3.4 ng/mL              Borderline: 3.4-5.4  ng/mL              Normal:  >5.4 ng/mL     Ferritin     Status: None   Collection Time: 01/14/16  4:06 PM  Result Value Ref Range   Ferritin 20 20 - 288 ng/mL  BASIC METABOLIC PANEL WITH GFR     Status: Abnormal   Collection Time: 01/19/16 11:35 AM  Result Value Ref Range   Sodium 136 135 - 146 mmol/L   Potassium 4.7 3.5 - 5.3 mmol/L   Chloride 95 (L) 98 - 110 mmol/L   CO2 32 (H) 20 - 31 mmol/L   Glucose, Bld 149 (H) 70 - 99 mg/dL   BUN 25 7 - 25 mg/dL   Creat 1.12 (H) 0.60 - 0.93 mg/dL   Calcium 9.5 8.6 - 10.4 mg/dL   GFR, Est African American 54 (L) >=60 mL/min   GFR, Est Non African American 47 (L) >=60 mL/min    Comment:   The estimated GFR is a calculation valid for adults (>=50 years old) that uses the CKD-EPI algorithm to adjust for age and sex. It is   not to be used for children, pregnant women, hospitalized patients,    patients on dialysis, or with rapidly changing kidney function. According to the NKDEP, eGFR >89 is normal, 60-89 shows mild impairment, 30-59 shows moderate impairment, 15-29 shows severe impairment and <15 is ESRD.     Brain natriuretic peptide     Status: Abnormal   Collection Time: 01/19/16 11:35 AM  Result Value Ref Range   Brain Natriuretic Peptide 1795.3 (H) <100 pg/mL    Comment:   BNP levels increase with age in the general population with the highest values seen in individuals greater than 45 years of age. Reference: Joellyn Rued Cardiol 2002; 46:503-54.     Lipid panel     Status: Abnormal   Collection Time: 01/21/16  8:49 AM  Result Value Ref Range   Cholesterol 135 125 - 200 mg/dL   Triglycerides 76 <150 mg/dL   HDL 39 (L) >=46 mg/dL   Total CHOL/HDL Ratio 3.5 <=5.0 Ratio   VLDL 15 <30 mg/dL   LDL Cholesterol 81 <130 mg/dL    Comment:   Total Cholesterol/HDL Ratio:CHD Risk                        Coronary Heart Disease Risk Table                                        Men       Women          1/2 Average Risk              3.4        3.3               Average Risk              5.0        4.4           2X Average Risk              9.6        7.1           3X Average Risk             23.4  11.0 Use the calculated Patient Ratio above and the CHD Risk table  to determine the patient's CHD Risk.   Hepatic function panel     Status: Abnormal   Collection Time: 01/21/16  8:49 AM  Result Value Ref Range   Total Bilirubin 1.0 0.2 - 1.2 mg/dL   Bilirubin, Direct 0.4 (H) <=0.2 mg/dL   Indirect Bilirubin 0.6 0.2 - 1.2 mg/dL   Alkaline Phosphatase 119 33 - 130 U/L   AST 16 10 - 35 U/L   ALT 7 6 - 29 U/L   Total Protein 7.3 6.1 - 8.1 g/dL   Albumin 3.8 3.6 - 5.1 g/dL  BASIC METABOLIC PANEL WITH GFR     Status: Abnormal   Collection Time: 01/26/16 11:09 AM  Result Value Ref Range   Sodium 135 135 - 146 mmol/L   Potassium 4.9 3.5 - 5.3 mmol/L   Chloride 96 (L) 98 - 110 mmol/L   CO2 26 20 - 31 mmol/L   Glucose, Bld 171 (H) 70 - 99 mg/dL   BUN 32 (H) 7 - 25 mg/dL   Creat 0.92 0.60 - 0.93 mg/dL   Calcium 9.5 8.6 - 10.4 mg/dL   GFR, Est African American 68 >=60 mL/min   GFR, Est Non African American 59 (L) >=60 mL/min    Comment:   The estimated GFR is a calculation valid for adults (>=30 years old) that uses the CKD-EPI algorithm to adjust for age and sex. It is   not to be used for children, pregnant women, hospitalized patients,    patients on dialysis, or with rapidly changing kidney function. According to the NKDEP, eGFR >89 is normal, 60-89 shows mild impairment, 30-59 shows moderate impairment, 15-29 shows severe impairment and <15 is ESRD.     Fecal occult blood, imunochemical     Status: Abnormal   Collection Time: 02/02/16  5:28 AM  Result Value Ref Range   Fecal Occult Blood POS (A) Negative    Objective: General: Patient is awake, alert, and oriented x 3 and in no acute distress.  Integument: Skin is warm, dry and supple bilateral. Nails are tender, long, thickened and  dystrophic with subungual debris, consistent  with onychomycosis, 1-5 bilateral. No signs of infection. No open lesions or preulcerative lesions present bilateral. Remaining integument unremarkable.  Vasculature:  Dorsalis Pedis pulse 2/4 bilateral. Posterior Tibial pulse  1/4 bilateral.  Capillary fill time <3 sec 1-5 bilateral. Positive hair growth to the level of the digits. Temperature gradient within normal limits. + varicosities present bilateral. Trace edema present bilateral.   Neurology: The patient has diminished sensation measured with a 5.07/10g Semmes Weinstein Monofilament at all pedal sites bilateral . Vibratory sensation diminished bilateral with tuning fork. No Babinski sign present bilateral.   Musculoskeletal:Asymptomatic bunion pedal deformities noted bilateral. Muscular strength 5/5 in all lower extremity muscular groups bilateral without pain on range of motion . No tenderness with calf compression bilateral.  Assessment and Plan: Problem List Items Addressed This Visit    None    Visit Diagnoses    Pain due to onychomycosis of toenail    -  Primary    Diabetic polyneuropathy associated with type 2 diabetes mellitus (HCC)        PVD (peripheral vascular disease) ([place])          -Examined patient. -Discussed and educated patient on diabetic foot care, especially with  regards to the vascular, neurological and musculoskeletal systems.  -Stressed the importance of good glycemic control  and the detriment of not  controlling glucose levels in relation to the foot. -Mechanically debrided all nails 1-5 bilateral using sterile nail nipper and filed with dremel without incident  -Encouraged lower limb elevation to assist with edema control and cont with lasix as Rx by PCP -Cont with good supportive shoes daily for foot type -Answered all patient questions -Patient to return  in 3 months for at risk foot care -Patient advised to call the office if any problems or questions arise in the meantime.  Landis Martins,  DPM

## 2016-02-24 NOTE — Patient Instructions (Addendum)
Diabetes and Foot Care Diabetes may cause you to have problems because of poor blood supply (circulation) to your feet and legs. This may cause the skin on your feet to become thinner, break easier, and heal more slowly. Your skin may become dry, and the skin may peel and crack. You may also have nerve damage in your legs and feet causing decreased feeling in them. You may not notice minor injuries to your feet that could lead to infections or more serious problems. Taking care of your feet is one of the most important things you can do for yourself.  HOME CARE INSTRUCTIONS  Wear shoes at all times, even in the house. Do not go barefoot. Bare feet are easily injured.  Check your feet daily for blisters, cuts, and redness. If you cannot see the bottom of your feet, use a mirror or ask someone for help.  Wash your feet with warm water (do not use hot water) and mild soap. Then pat your feet and the areas between your toes until they are completely dry. Do not soak your feet as this can dry your skin.  Apply a moisturizing lotion or petroleum jelly (that does not contain alcohol and is unscented) to the skin on your feet and to dry, brittle toenails. Do not apply lotion between your toes.  Trim your toenails straight across. Do not dig under them or around the cuticle. File the edges of your nails with an emery board or nail file.  Do not cut corns or calluses or try to remove them with medicine.  Wear clean socks or stockings every day. Make sure they are not too tight. Do not wear knee-high stockings since they may decrease blood flow to your legs.  Wear shoes that fit properly and have enough cushioning. To break in new shoes, wear them for just a few hours a day. This prevents you from injuring your feet. Always look in your shoes before you put them on to be sure there are no objects inside.  Do not cross your legs. This may decrease the blood flow to your feet.  If you find a minor scrape,  cut, or break in the skin on your feet, keep it and the skin around it clean and dry. These areas may be cleansed with mild soap and water. Do not cleanse the area with peroxide, alcohol, or iodine.  When you remove an adhesive bandage, be sure not to damage the skin around it.  If you have a wound, look at it several times a day to make sure it is healing.  Do not use heating pads or hot water bottles. They may burn your skin. If you have lost feeling in your feet or legs, you may not know it is happening until it is too late.  Make sure your health care provider performs a complete foot exam at least annually or more often if you have foot problems. Report any cuts, sores, or bruises to your health care provider immediately. SEEK MEDICAL CARE IF:   You have an injury that is not healing.  You have cuts or breaks in the skin.  You have an ingrown nail.  You notice redness on your legs or feet.  You feel burning or tingling in your legs or feet.  You have pain or cramps in your legs and feet.  Your legs or feet are numb.  Your feet always feel cold. SEEK IMMEDIATE MEDICAL CARE IF:   There is increasing redness,   swelling, or pain in or around a wound.  There is a red line that goes up your leg.  Pus is coming from a wound.  You develop a fever or as directed by your health care provider.  You notice a bad smell coming from an ulcer or wound.   This information is not intended to replace advice given to you by your health care provider. Make sure you discuss any questions you have with your health care provider.   Document Released: 09/03/2000 Document Revised: 05/09/2013 Document Reviewed: 02/13/2013 Elsevier Interactive Patient Education 2016 [employer].  Diabetes y cuidados del pie (Diabetes and Foot Care) La diabetes puede ser la causa de que el flujo sanguneo (circulacin) en las piernas y los pies sea deficiente. Debido a esto, la piel de los pies se torna ms  delgada, se rompe con facilidad y se cura ms lentamente. La piel puede estar seca, despellejarse y Medical illustrator. Tambin pueden estar daados los nervios de las piernas y de los pies lo que provoca una disminucin de la sensibilidad. Es posible que no advierta heridas ms pequeas en los pies, que pueden causar infecciones graves. Cuidar sus pies es una de las cosas ms importantes que puede hacer por usted mismo.  INSTRUCCIONES PARA EL CUIDADO EN EL HOGAR  Use siempre calzado, an dentro de su casa. No camine descalzo. Caminar descalzo facilita que se lastime.  Controle sus pies diariamente para observar ampollas, cortes y enrojecimiento. Si no puede ver la planta del pie, use un espejo o pdale ayuda a Nurse, children's.  Lave sus pies con agua tibia (no use agua caliente) y un [place]. Seque bien sus pies, y la zona [employer] dedos dando [place], hasta que estn completamente secos. Noremoje los pies, ya que esto puede resecar la piel.  Aplique una locin hidratante o vaselina (que no contenga alcohol ni perfume) en los pies y en las uas secas y [place]. No aplique locin entre los dedos.  Recorte las uas en forma recta. No escarbe debajo de las uas o alrededor [employer]. Lime los bordes de las uas con una lima o esmeril.  No corte las durezas o callosidades, ni trate de quitarlas con medicamentos.  Use calcetines de algodn o medias [employer]. Asegrese de que no le [employer]. Nouse calcetines que le lleguen a las rodillas, ya que podran disminuir el flujo de sangre a las piernas.  Use zapatos de cuero que le queden bien y que sean acolchados. Para amoldar los zapatos, clcelos slo algunas horas por da. Esto evitar lesiones en los pies. Revise siempre los zapatos antes de ponerlos para asegurarse de que no haya objetos en su interior.  No cruce las piernas. Esto puede disminuir el flujo de sangre a los pies.  Si algo le ha raspado, cortado o lastimado  la piel de los pies, mantenga la piel de esa zona limpia y [place]. Debe higienizar estas zonas con agua y un jabn suave. No limpie la zona con agua oxigenada, alcohol ni yodo.  Cuando se quite un vendaje adhesivo, asegrese de no daar la piel.  Si tiene una herida, obsrvela varias veces por da para asegurarse de que se est curando.  No use bolsas de agua caliente ni almohadillas trmicas. Podran causar quemaduras. Si ha perdido la sensibilidad en los pies o las piernas, no sabr lo que le est sucediendo hasta que sea demasiado tarde.  Asegrese de que su mdico le haga un examen completo de  los pies por lo menos una vez al ao, o con ms frecuencia si usted tiene [employer]. Informe todos los cortes, llagas o moretones a su mdico inmediatamente. SOLICITE ATENCIN MDICA SI:   Tiene una lesin que no se cura.  Tiene cortes o rajaduras en la piel.  Tiene una ua encarnada.  Nota una zona irritada en las piernas o los pies.  Siente una sensacin de ardor u hormigueo en las piernas o los pies.  Siente dolor o calambres en las piernas o los pies.  Las piernas o los pies estn adormecidos.  Siente los pies siempre fros. SOLICITE ATENCIN MDICA DE INMEDIATO SI:   Presenta enrojecimiento, hinchazn o aumento del dolor en una herida.  Nota una lnea roja que sube por pierna.  Aparece pus en la herida.  Le sube la fiebre o segn lo que le indique el mdico.  Advierte un olor ftido que proviene de una lcera o una herida.   Esta informacin no tiene Marine scientist el consejo del mdico. Asegrese de hacerle al mdico cualquier pregunta que tenga.   Document Released: 09/06/2005 Document Revised: 05/09/2013 Elsevier Interactive Patient Education [employer].

## 2016-02-25 ENCOUNTER — Encounter: Payer: Self-pay | Admitting: Cardiovascular Disease

## 2016-02-25 ENCOUNTER — Ambulatory Visit (INDEPENDENT_AMBULATORY_CARE_PROVIDER_SITE_OTHER): Payer: Medicare Other | Admitting: Cardiovascular Disease

## 2016-02-25 VITALS — BP 82/46 | HR 91 | Ht 63.0 in | Wt 126.0 lb

## 2016-02-25 DIAGNOSIS — I2510 Atherosclerotic heart disease of native coronary artery without angina pectoris: Secondary | ICD-10-CM | POA: Diagnosis not present

## 2016-02-25 DIAGNOSIS — I5022 Chronic systolic (congestive) heart failure: Secondary | ICD-10-CM

## 2016-02-25 NOTE — Patient Instructions (Signed)

## 2016-02-25 NOTE — Assessment & Plan Note (Signed)
April Short returns today for follow-up of her 2-D echocardiogram performed on 02/04/16. His revealed an ejection fraction of 20-25%. She has lost several pounds since her last office visit one month ago and is on Lasix, spironolactone and Entresto. At this point, she appears euvolemic and compensated. We will continue current medications. She is aware of all restriction.Marland Kitchen

## 2016-02-25 NOTE — Progress Notes (Signed)
02/25/2016 April Short   08/26/1935  [id number]  Primary Physician Karis Juba, PA-C Primary Cardiologist: Lorretta Harp MD Renae Gloss  HPI:  April Short is a delightful 81 year old widowed Latino female baby by her daughter April Short today.i last saw her in the office 01/21/16. She was referred by Karis Juba for cardiac evaluation to be established in our practice. She recently relocated from [place] to [place] be closer to family. She does have a history of diabetes. She had 2 drug-eluting stents placed in her circumflex and obtuse marginal branch and [place] 10/31/15 with a 2.5 x 30 mm long resolute stent placement in obtuse marginal branch and a 3.5 mm x 12 mm long placed in the circumflex. She is on appropriate medicines for systolic heart failure. She recently lost a significant amount of weight secondary to diuresis. She isambulatory but really does not leave the house. She denies chest pain. She does have some dyspnea on exertion. She is on dual therapy with Brilenta. ince I saw her in the office proximally a month ago she has lost several pounds. She has no peripheral edema. A 2-D echo was ordered on 02/04/16 that showed an EF of 20-25%.   Current Outpatient Prescriptions  Medication Sig Dispense Refill  . aspirin EC 81 MG tablet Take 81 mg by mouth daily.    . carvedilol (COREG) 6.25 MG tablet Take 1 tablet (6.25 mg total) by mouth 2 (two) times daily with a meal. 180 tablet 2  . Dextromethorphan-Guaifenesin (DIABETIC TUSSIN MAX ST PO) Take by mouth as needed. Reported on 01/19/2016    . furosemide (LASIX) 80 MG tablet Take 1 tablet (80 mg total) by mouth daily. 30 tablet 3  . glipiZIDE (GLUCOTROL XL) 10 MG 24 hr tablet Take 10 mg by mouth daily with breakfast.    . polyethylene glycol (MIRALAX / GLYCOLAX) packet Take 17 g by mouth daily as needed.    . potassium chloride SA (K-DUR,KLOR-CON) 20 MEQ tablet Take 1 tablet (20 mEq total) by mouth daily. 30  tablet 3  . sacubitril-valsartan (ENTRESTO) 24-26 MG Take 1 tablet by mouth 2 (two) times daily. 60 tablet 5  . sitaGLIPtin (JANUVIA) 100 MG tablet Take 1 tablet (100 mg total) by mouth daily. 30 tablet 3  . spironolactone (ALDACTONE) 25 MG tablet Take 25 mg by mouth daily.    . ticagrelor (BRILINTA) 90 MG TABS tablet Take by mouth 2 (two) times daily.     No current facility-administered medications for this visit.    No Known Allergies  Social History   Social History  . Marital Status: Married    Spouse Name: N/A  . Number of Children: N/A  . Years of Education: N/A   Occupational History  . Not on file.   Social History Main Topics  . Smoking status: Never Smoker   . Smokeless tobacco: Never Used  . Alcohol Use: 0.0 oz/week    0 Standard drinks or equivalent per week     Comment: occasionally  . Drug Use: No  . Sexual Activity: Not on file   Other Topics Concern  . Not on file   Social History Narrative     Review of Systems: General: negative for chills, fever, night sweats or weight changes.  Cardiovascular: negative for chest pain, dyspnea on exertion, edema, orthopnea, palpitations, paroxysmal nocturnal dyspnea or shortness of breath Dermatological: negative for rash Respiratory: negative for cough or wheezing Urologic: negative for hematuria Abdominal:  negative for nausea, vomiting, diarrhea, bright red blood per rectum, melena, or hematemesis Neurologic: negative for visual changes, syncope, or dizziness All other systems reviewed and are otherwise negative except as noted above.    Blood pressure 82/46, pulse 91, height 5\' 3"  (1.6 m), weight 126 lb (57.153 kg).  General appearance: alert and no distress Neck: no adenopathy, no carotid bruit, no JVD, supple, symmetrical, trachea midline and thyroid not enlarged, symmetric, no tenderness/mass/nodules Lungs: clear to auscultation bilaterally Heart: regular rate and rhythm, S1, S2 normal, no murmur, click,  rub or gallop Extremities: extremities normal, atraumatic, no cyanosis or edema  EKG not performed today  ASSESSMENT AND PLAN:   Chronic systolic heart failure [facility]) Mrs. Loch returns today for follow-up of her 2-D echocardiogram performed on 02/04/16. His revealed an ejection fraction of 20-25%. She has lost several pounds since her last office visit one month ago and is on Lasix, spironolactone and Entresto. At this point, she appears euvolemic and compensated. We will continue current medications. She is aware of all restriction.Lorretta Harp MD FACP,FACC,FAHA, FSCAI 02/25/2016 2:26 PM

## 2016-02-25 NOTE — Progress Notes (Deleted)
     02/25/2016 April Short   11-17-1934  [id number]  Primary Physician Karis Juba, PA-C Primary Cardiologist: ***  HPI:  ***   Current Outpatient Prescriptions  Medication Sig Dispense Refill  . aspirin EC 81 MG tablet Take 81 mg by mouth daily.    . carvedilol (COREG) 6.25 MG tablet Take 1 tablet (6.25 mg total) by mouth 2 (two) times daily with a meal. 180 tablet 2  . Dextromethorphan-Guaifenesin (DIABETIC TUSSIN MAX ST PO) Take by mouth as needed. Reported on 01/19/2016    . furosemide (LASIX) 80 MG tablet Take 1 tablet (80 mg total) by mouth daily. 30 tablet 3  . glipiZIDE (GLUCOTROL XL) 10 MG 24 hr tablet Take 10 mg by mouth daily with breakfast.    . polyethylene glycol (MIRALAX / GLYCOLAX) packet Take 17 g by mouth daily as needed.    . potassium chloride SA (K-DUR,KLOR-CON) 20 MEQ tablet Take 1 tablet (20 mEq total) by mouth daily. 30 tablet 3  . sacubitril-valsartan (ENTRESTO) 24-26 MG Take 1 tablet by mouth 2 (two) times daily. 60 tablet 5  . sitaGLIPtin (JANUVIA) 100 MG tablet Take 1 tablet (100 mg total) by mouth daily. 30 tablet 3  . spironolactone (ALDACTONE) 25 MG tablet Take 25 mg by mouth daily.    . ticagrelor (BRILINTA) 90 MG TABS tablet Take by mouth 2 (two) times daily.     No current facility-administered medications for this visit.    No Known Allergies  Social History   Social History  . Marital Status: Married    Spouse Name: N/A  . Number of Children: N/A  . Years of Education: N/A   Occupational History  . Not on file.   Social History Main Topics  . Smoking status: Never Smoker   . Smokeless tobacco: Never Used  . Alcohol Use: 0.0 oz/week    0 Standard drinks or equivalent per week     Comment: occasionally  . Drug Use: No  . Sexual Activity: Not on file   Other Topics Concern  . Not on file   Social History Narrative     Review of Systems: General: negative for chills, fever, night sweats or weight changes.  Cardiovascular:  negative for chest pain, dyspnea on exertion, edema, orthopnea, palpitations, paroxysmal nocturnal dyspnea or shortness of breath Dermatological: negative for rash Respiratory: negative for cough or wheezing Urologic: negative for hematuria Abdominal: negative for nausea, vomiting, diarrhea, bright red blood per rectum, melena, or hematemesis Neurologic: negative for visual changes, syncope, or dizziness All other systems reviewed and are otherwise negative except as noted above.    There were no vitals taken for this visit.  {Physical RR:[id number]  EKG ***  ASSESSMENT AND PLAN:   No problem-specific assessment & plan notes found for this encounter.      Lorretta Harp MD FACP,FACC,FAHA, [facility] 02/25/2016 1:44 PM

## 2016-03-15 ENCOUNTER — Other Ambulatory Visit: Payer: Self-pay | Admitting: Physician Assistant

## 2016-03-15 NOTE — Telephone Encounter (Signed)
Medication refilled per protocol. 

## 2016-03-26 ENCOUNTER — Telehealth: Payer: Self-pay | Admitting: Gastroenterology

## 2016-03-26 NOTE — Telephone Encounter (Signed)
Pt scheduled to see Amy Esterwood PA 04/05/16@10 :30am. Please notify pt of appt.

## 2016-03-26 NOTE — Telephone Encounter (Signed)
Patient's daughter has been notified of appointment and has accepted appointment.

## 2016-04-05 ENCOUNTER — Other Ambulatory Visit (INDEPENDENT_AMBULATORY_CARE_PROVIDER_SITE_OTHER): Payer: Medicare Other

## 2016-04-05 ENCOUNTER — Ambulatory Visit (INDEPENDENT_AMBULATORY_CARE_PROVIDER_SITE_OTHER): Payer: Medicare Other | Admitting: Physician Assistant

## 2016-04-05 ENCOUNTER — Encounter: Payer: Self-pay | Admitting: Physician Assistant

## 2016-04-05 ENCOUNTER — Telehealth: Payer: Self-pay | Admitting: *Deleted

## 2016-04-05 VITALS — BP 90/50 | HR 84 | Ht 61.5 in | Wt 129.1 lb

## 2016-04-05 DIAGNOSIS — Z85038 Personal history of other malignant neoplasm of large intestine: Secondary | ICD-10-CM

## 2016-04-05 DIAGNOSIS — D509 Iron deficiency anemia, unspecified: Secondary | ICD-10-CM

## 2016-04-05 DIAGNOSIS — R195 Other fecal abnormalities: Secondary | ICD-10-CM

## 2016-04-05 DIAGNOSIS — R634 Abnormal weight loss: Secondary | ICD-10-CM

## 2016-04-05 MED ORDER — NA SULFATE-K SULFATE-MG SULF 17.5-3.13-1.6 GM/177ML PO SOLN: 1.0000 | Freq: Once | ORAL | Status: AC

## 2016-04-05 MED ORDER — POLYSACCHARIDE IRON COMPLEX 150 MG PO CAPS: 150.0000 mg | ORAL_CAPSULE | Freq: Two times a day (BID) | ORAL | Status: DC

## 2016-04-05 NOTE — Patient Instructions (Signed)
Please go to the basement level to have your labs drawn.  We sent a prescription to [facility]. Suprep, and Nuiron.  You have been scheduled for an endoscopy and colonoscopy. Please follow the written instructions given to you at your visit today. Please pick up your prep supplies at the pharmacy within the next 1-3 days.  If you use inhalers (even only as needed), please bring them with you on the day of your procedure. Your physician has requested that you go to www.startemmi.com and enter the access code given to you at your visit today. This web site gives a general overview about your procedure. However, you should still follow specific instructions given to you by our office regarding your preparation for the procedure.

## 2016-04-05 NOTE — Telephone Encounter (Signed)
/  17/2017   RE: Jesikah Gouger DOB: 06-Dec-1934 MRN: [id number]   Dear Dr. Quay Burow,    We have scheduled the above patient for an endoscopic procedure. Our records show that she is on anticoagulation therapy.   Please advise as to how long the patient may come off her therapy of Brilinta prior to the procedure, which is scheduled for 05-25-2016.  Please fax back/ or route the completed form to [place] at [phone].   Sincerely,    Amy Esterwood PA-C

## 2016-04-05 NOTE — Progress Notes (Signed)
Patient ID: April Short, female   DOB: July 30, 1935, 81 y.o.   MRN: [id number]   Subjective:    Patient ID: April Short, female    DOB: 08/29/1935, 81 y.o.   MRN: [id number]  HPI  April Short is a pleasant 81 year old Hispanic female referred today by Doreen Beam PA-C, new to GI with iron deficiency anemia and Hemoccult-positive stool. Patient recently relocated from [place] to [place] to live with her daughter She has fairly extensive medical history. She has coronary artery disease and congestive heart failure and has recently established with Dr. Quay Burow. She had echo done in May 2017 with finding of EF of 20-25%. She also had 2 drug-eluting stents placed in February 2017 just prior to moving here. She is on Brilinta and aspirin. She recently had an exacerbation of congestive heart failure in his improved over the past few weeks with diuresis. Pt  last had labs done in April 2017 with finding hemoglobin of 9.1 hematocrit of 30.6 MCV of 74 serum iron 25 TIBC 454 iron saturation of 6, B12 and folate levels within normal limits. Patient also has history of colon cancer according to her daughter diagnosed in 2001. She underwent surgery and then a course of chemotherapy but did not require radiation. The patient says she had serial colonoscopies after that and last had colonoscopy 2 years ago in [place]. She believes she had 1 polyp found and removed at that time. Patient also was diagnosed with cervical cancer in 2005. This was treated with surgery as well. Language barrier presents some difficulty with history but patient feels that she has had iron deficiency anemia in the past caused taking iron. She has no current complaints of dysphagia odynophagia or heartburn indigestion nausea. Her appetite is been fine her weight is down however that was secondary to diuresis. She has no complaints of abdominal pain and changes in bowel habits melena or hematochezia. She was documented to be  Hemoccult-positive.   Review of Systems Pertinent positive and negative review of systems were noted in the above HPI section.  All other review of systems was otherwise negative.  Outpatient Encounter Prescriptions as of 04/05/2016  Medication Sig  . aspirin EC 81 MG tablet Take 81 mg by mouth daily.  . carvedilol (COREG) 6.25 MG tablet Take 1 tablet (6.25 mg total) by mouth 2 (two) times daily with a meal.  . furosemide (LASIX) 80 MG tablet TAKE 1 TABLET (80 MG TOTAL) BY MOUTH 2 (TWO) TIMES DAILY.  Marland Kitchen glipiZIDE (GLUCOTROL XL) 10 MG 24 hr tablet Take 10 mg by mouth daily with breakfast.  . KLOR-CON M20 20 MEQ tablet TAKE 1 TABLET (20 MEQ TOTAL) BY MOUTH 2 (TWO) TIMES DAILY.  Marland Kitchen polyethylene glycol (MIRALAX / GLYCOLAX) packet Take 17 g by mouth daily as needed.  . sacubitril-valsartan (ENTRESTO) 24-26 MG Take 1 tablet by mouth 2 (two) times daily.  . sitaGLIPtin (JANUVIA) 100 MG tablet Take 1 tablet (100 mg total) by mouth daily.  Marland Kitchen spironolactone (ALDACTONE) 25 MG tablet Take 25 mg by mouth daily.  . ticagrelor (BRILINTA) 90 MG TABS tablet Take by mouth 2 (two) times daily.  . iron polysaccharides (NU-IRON) 150 MG capsule Take 1 capsule (150 mg total) by mouth 2 (two) times daily.  . Na Sulfate-K Sulfate-Mg Sulf 17.5-3.13-1.6 GM/180ML SOLN Take 1 kit by mouth once.  . [DISCONTINUED] Dextromethorphan-Guaifenesin (DIABETIC TUSSIN MAX ST PO) Take by mouth as needed. Reported on 01/19/2016   No facility-administered encounter medications on file as  of 04/05/2016.   No Known Allergies Patient Active Problem List   Diagnosis Date Noted  . Iron deficiency anemia 01/26/2016  . DM (diabetes mellitus), type 2 with neurological complications ([place]) 74/08/8785  . Coronary artery disease 01/14/2016  . Chronic systolic heart failure ([place]) 01/14/2016  . Neuropathy ([place]) 79/26/2017   Social History   Social History  . Marital Status: Married    Spouse Name: N/A  . Number of Children: 7  . Years of  Education: N/A   Occupational History  . Not on file.   Social History Main Topics  . Smoking status: Never Smoker   . Smokeless tobacco: Never Used  . Alcohol Use: 0.0 oz/week    0 Standard drinks or equivalent per week     Comment: occasionally  . Drug Use: No  . Sexual Activity: Not on file   Other Topics Concern  . Not on file   Social History Narrative    April Short's family history includes Colon cancer in her mother; Diabetes in her sister; Stomach cancer in her son.      Objective:    Filed Vitals:   04/05/16 1036  BP: 90/50  Pulse: 84    Physical Exam  well-developed elderly Hispanic female in no acute distress, accompanied by 2 daughters. Patient is non-English speaking and daughter interprets for her blood pressure 90/50 pulse 84, height 5 foot 1 weight 129 BMI 24. HEENT ;nontraumatic normocephalic EOMI PERRLA sclera anicteric, Cardiovascular; regular rate and rhythm with S1-S2 no murmur rub or gallop, Pulmonary clear bilaterally, Abdomen ;soft nontender nondistended bowel sounds are active there is no palpable mass or hepatosplenomegaly she does have midline incisional scar and a lower transverse scar, Rectal; exam not done, Extremities;trace pedal edema bilaterally, Neuropsych; mood and affect appropriate     Assessment & Plan:   #4  81 year old Hispanic female with iron deficiency anemia and Hemoccult-positive stool in setting of recurrent chronic anticoagulation and personal history of colon cancer 2001, status post resection and adjuvant chemotherapy. A recurrent colon neoplasm would be of highest concern, cannot rule out chronic iron deficiency anemia and GI blood loss secondary to other GI pathology i.e. AVMs etc. #2 history of colon polyps #3 congestive heart failure with EF of 20-25% #4 coronary artery disease status post 2 recent drug-eluting stents February 2017 #5 chronic anticoagulation with Brilinta and aspirin #6 history of cervical cancer  #7  adult-onset diabetes mellitus  Plan; patient is high risk for complications with sedation and endoscopic evaluation given her multiple comorbidities and this was discussed with the patient and her family today With fairly recent coronary stents expect cardiology would like her to be at least 6 months out prior to holding anticoagulation for any anticipated procedures Will schedule for colonoscopy and EGD with Dr. Henrene Pastor at [facility] given EF of 20-25%. We will purposely schedule this for early September 2017, and in the interim we'll communicate with her cardiologist Dr. Gwenlyn Found to assure that holding Brilinta for 5 days prior to these procedures is acceptable  Check CBC today Start oral iron, Nu-Iron 150 by mouth twice a day  Patient will sign a release and will obtain her old records from her gastroenterologist in [place]. It is possible that she has had a chronic iron deficiency anemia and if this is the case she not need repeat endoscopic evaluation at this time. Had a long discussion with the patient and her family regarding all of these issues, risks today.   Leen Tworek  S Sydnee Lamour PA-C 04/05/2016   Cc: Orlena Sheldon, PA-C

## 2016-04-06 LAB — CBC WITH DIFFERENTIAL/PLATELET
Basophils Absolute: 0 10*3/uL (ref 0.0–0.1)
Basophils Relative: 0.5 % (ref 0.0–3.0)
EOS PCT: 3.2 % (ref 0.0–5.0)
Eosinophils Absolute: 0.2 10*3/uL (ref 0.0–0.7)
HCT: 34.3 % — ABNORMAL LOW (ref 36.0–46.0)
HEMOGLOBIN: 11.3 g/dL — AB (ref 12.0–15.0)
LYMPHS PCT: 26.4 % (ref 12.0–46.0)
Lymphs Abs: 2 10*3/uL (ref 0.7–4.0)
MCHC: 33 g/dL (ref 30.0–36.0)
MCV: 79.5 fl (ref 78.0–100.0)
MONO ABS: 0.7 10*3/uL (ref 0.1–1.0)
MONOS PCT: 9 % (ref 3.0–12.0)
Neutro Abs: 4.7 10*3/uL (ref 1.4–7.7)
Neutrophils Relative %: 60.9 % (ref 43.0–77.0)
Platelets: 231 10*3/uL (ref 150.0–400.0)
RBC: 4.32 Mil/uL (ref 3.87–5.11)
RDW: 25.7 % — ABNORMAL HIGH (ref 11.5–15.5)
WBC: 7.7 10*3/uL (ref 4.0–10.5)

## 2016-04-06 NOTE — Progress Notes (Signed)
Complicated case. Excellent consultation note reviewed. Agree with plans as outlined. Amy, please keep this on your personal radar for appropriate timing and cardiology communication. Thanks

## 2016-04-06 NOTE — Telephone Encounter (Signed)
She can not interrupt her Brilenta until 2/18

## 2016-04-12 ENCOUNTER — Telehealth: Payer: Self-pay | Admitting: *Deleted

## 2016-04-12 NOTE — Telephone Encounter (Signed)
Called and Lm for Regino Schultze, the patient's daughter. Advised her we are going to cancel her procedure for now per Dr. Scarlette Shorts. Since Dr. Gwenlyn Found does not want the patient taken off the blood thinner.  Dr. Henrene Pastor doesn't want to do the procedures on the blood thinner.  Asked the patient to please call me back. Also we need the information for the Release her mother signed, I need the [place] information, Hospital, MD office ??

## 2016-04-12 NOTE — Telephone Encounter (Signed)
-----   Message from Alfredia Ferguson, PA-C sent at 04/12/2016  3:15 PM EDT ----- Regarding: RE: Dr Antony Haste- so Colonoscopy and EGD should be cancelled for now, will confirm with Dr Henrene Pastor- also we had requested her old records from [place] and I have not seen them - may need to re -request-was trying to determine if she has a chronic iron deficiency anemia ----- Message ----- From: Tonette Bihari, CMA Sent: 04/07/2016   1:46 PM To: Alfredia Ferguson, PA-C Subject: [name]                           Amy, Wanted to remind you that Dr. Gwenlyn Found does not want to interrupt her Brilinta until Feb 2018. Procedure is for 05-25-2016.  Persephonie Hegwood

## 2016-04-13 NOTE — Telephone Encounter (Signed)
April Short, the patient's daughter. She gave me the name of the MD, Dr. Tawni Carnes, # [phone]. Fax # [phone] Advised her that  For now we are canceling the procedure for her mother scheduled for 9-5-12017 at [facility].  Informed her that Dr. Henrene Pastor advised we should cancel due to [place] needing the information from [place], records and the iron deficiency anemia.  Informed Regino Schultze, we will call them after we get the records and review them.

## 2016-04-14 NOTE — Telephone Encounter (Signed)
Called the patient's daughter Regino Schultze, and she gave me the information for the MD in [place]. Dr. Tawni Carnes. I faxed the release of information on Tuesday 04-13-2016.

## 2016-04-21 ENCOUNTER — Telehealth: Payer: Self-pay | Admitting: Physician Assistant

## 2016-04-21 ENCOUNTER — Telehealth: Payer: Self-pay | Admitting: *Deleted

## 2016-04-21 NOTE — Telephone Encounter (Signed)
Called Dr. Jorene Minors office in [place].  LM that I had faxed a signed release on 04-05-2016. I have not gotten a response. The patient had EGD/Colon . We would like them to fax [place] the records.  I asked Sydell Axon in Medical records to please call me at [phone].

## 2016-04-21 NOTE — Telephone Encounter (Signed)
-----   Message from Irene Shipper, MD sent at 04/13/2016  2:31 PM EDT ----- Regarding: RE: [name] Denaja Verhoeven, given the age, comorbidities, and he issue with Brilinta, I recommend the following: 1. Daily PPI indefinitely 2. Iron therapy twice daily by mouth. Iron sulfate 325 mg twice a day. 3. Recheck CBC and ferritin in 6 weeks. If compliant with oral iron therapy but problem persists, then sent to hematology for probable iron infusion 4. Schedule virtual colonoscopy to rule out significant colonic lesion. If negative, we do not need colonoscopy 5. Schedule upper GI series. If negative, we do not need EGD. We can assume AVMs or nonspecific mucosal oozing. 6. If the problem persists and questions remain, could consider capsule endoscopy, but would not schedule if  virtual colonoscopy and upper GI series negative and patient response to iron replacement. 7. Please make arrangements to see the patient back in your office in 3 months and again in February 8. Please convert all of this staff message to phone note so we can refer back to it in the formal medical record. Thanks  Dr. Henrene Pastor  ----- Message ----- From: Alfredia Ferguson, PA-C Sent: 04/12/2016   3:15 PM To: Irene Shipper, MD, Tonette Bihari, CMA Subject: RE: Dr Antony Haste- so Colonoscopy and EGD should be cancelled for now, will confirm with Dr Henrene Pastor- also we had requested her old records from [place] and I have not seen them - may need to re -request-was trying to determine if she has a chronic iron deficiency anemia ----- Message ----- From: Tonette Bihari, CMA Sent: 04/07/2016   1:46 PM To: Alfredia Ferguson, PA-C Subject: [name]                           Aryanna Shaver, Wanted to remind you that Dr. Gwenlyn Found does not want to interrupt her Brilinta until Feb 2018. Procedure is for 05-25-2016.  Pam

## 2016-04-22 ENCOUNTER — Other Ambulatory Visit: Payer: Self-pay

## 2016-04-22 ENCOUNTER — Ambulatory Visit: Payer: Medicare Other | Admitting: Nurse Practitioner

## 2016-04-22 DIAGNOSIS — D509 Iron deficiency anemia, unspecified: Secondary | ICD-10-CM

## 2016-04-22 DIAGNOSIS — K921 Melena: Secondary | ICD-10-CM

## 2016-04-22 DIAGNOSIS — Z85038 Personal history of other malignant neoplasm of large intestine: Secondary | ICD-10-CM

## 2016-04-23 ENCOUNTER — Other Ambulatory Visit: Payer: Self-pay

## 2016-04-26 ENCOUNTER — Other Ambulatory Visit: Payer: Self-pay

## 2016-04-26 ENCOUNTER — Telehealth: Payer: Self-pay

## 2016-04-26 DIAGNOSIS — K921 Melena: Secondary | ICD-10-CM

## 2016-04-26 MED ORDER — OMEPRAZOLE 20 MG PO CPDR: 20.0000 mg | DELAYED_RELEASE_CAPSULE | Freq: Every day | ORAL | 11 refills | Status: DC

## 2016-04-26 MED ORDER — FERROUS SULFATE 325 (65 FE) MG PO TABS: 325.0000 mg | ORAL_TABLET | Freq: Two times a day (BID) | ORAL | 11 refills | Status: DC

## 2016-04-26 NOTE — Telephone Encounter (Signed)
-----   Message from Alfredia Ferguson, PA-C sent at 04/14/2016 11:32 AM EDT ----- Regarding: FW: Dr Graciella Belton- please call pt and Family - I dont think she speaks much English- procedures have been cancelled for now - Dr Henrene Pastor would like her to have UGI, and virtual colon if can get it covered, then other recommendations as below. Will need to follow up with Amy in 3 months...please convert staff message  to something that will become permanent pasrt of chart ----- Message ----- From: Irene Shipper, MD Sent: 04/13/2016   2:31 PM To: Amy Genia Harold, PA-C Subject: RE: [name]                       Amy, given the age, comorbidities, and he issue with Brilinta, I recommend the following: 1. Daily PPI indefinitely 2. Iron therapy twice daily by mouth. Iron sulfate 325 mg twice a day. 3. Recheck CBC and ferritin in 6 weeks. If compliant with oral iron therapy but problem persists, then sent to hematology for probable iron infusion 4. Schedule virtual colonoscopy to rule out significant colonic lesion. If negative, we do not need colonoscopy 5. Schedule upper GI series. If negative, we do not need EGD. We can assume AVMs or nonspecific mucosal oozing. 6. If the problem persists and questions remain, could consider capsule endoscopy, but would not schedule if  virtual colonoscopy and upper GI series negative and patient response to iron replacement. 7. Please make arrangements to see the patient back in your office in 3 months and again in February 8. Please convert all of this staff message to phone note so we can refer back to it in the formal medical record. Thanks  Dr. Henrene Pastor  ----- Message ----- From: Alfredia Ferguson, PA-C Sent: 04/12/2016   3:15 PM To: Irene Shipper, MD, Tonette Bihari, CMA Subject: RE: Dr Antony Haste- so Colonoscopy and EGD should be cancelled for now, will confirm with Dr Henrene Pastor- also we had requested her old [place] and I have not seen them - may need to re -request-was trying to determine if she has a chronic iron deficiency anemia ----- Message ----- From: Tonette Bihari, CMA Sent: 04/07/2016   1:46 PM To: Alfredia Ferguson, PA-C Subject: [name]                           Amy, Wanted to remind you that Dr. Gwenlyn Found does not want to interrupt her Brilinta until Feb 2018. Procedure is for 05-25-2016.  Pam

## 2016-04-26 NOTE — Telephone Encounter (Signed)
Pt scheduled for virtual colon at [facility] 05/03/16@8am . Pt to arrive there at 7:45am. Pt needs to pick up prep and instructions from office by 04/28/16. UGI scheduled at [facility] 05/07/16@11am . Pt needs to arrive there by 10:45am. Scripts sent to pharmacy.

## 2016-04-27 ENCOUNTER — Other Ambulatory Visit: Payer: Self-pay

## 2016-04-27 DIAGNOSIS — D649 Anemia, unspecified: Secondary | ICD-10-CM

## 2016-04-27 NOTE — Telephone Encounter (Signed)
Spoke with pts daughter and she is aware of appts and instructions. Daughter picked up scripts yesterday. Recall entered for 67mth OV and Feb OV with Amy Esterwood PA. Lab orders and reminder in epic.

## 2016-04-30 ENCOUNTER — Telehealth: Payer: Self-pay | Admitting: *Deleted

## 2016-04-30 NOTE — Telephone Encounter (Signed)
LM for Dora at Dr. Jorene Minors office in [place], [place].  I have faxed the release twice, on 04-13-2016 and 04-29-2016. Called again today 04-30-2016 and had to [facility] for Sydell Axon in Medical Records. Trying to obtain records for this patient. Dr. Henrene Pastor and Nicoletta Ba PA want to see these records.

## 2016-05-03 ENCOUNTER — Ambulatory Visit
Admission: RE | Admit: 2016-05-03 | Discharge: 2016-05-03 | Disposition: A | Discharge status: Home / Self Care | Payer: Medicare Other | Source: Ambulatory Visit | Attending: Internal Medicine | Admitting: Internal Medicine

## 2016-05-03 DIAGNOSIS — D509 Iron deficiency anemia, unspecified: Secondary | ICD-10-CM

## 2016-05-03 DIAGNOSIS — Z85038 Personal history of other malignant neoplasm of large intestine: Secondary | ICD-10-CM | POA: Diagnosis not present

## 2016-05-03 DIAGNOSIS — K921 Melena: Secondary | ICD-10-CM

## 2016-05-04 ENCOUNTER — Telehealth: Payer: Self-pay | Admitting: *Deleted

## 2016-05-04 NOTE — Telephone Encounter (Signed)
-----   Message from Alfredia Ferguson, PA-C sent at 04/12/2016  3:15 PM EDT ----- Regarding: RE: Dr Antony Haste- so Colonoscopy and EGD should be cancelled for now, will confirm with Dr Henrene Pastor- also we had requested [place] old records [place] and I have not seen them - may need to re -request-was trying to determine if she has a chronic iron deficiency anemia ----- Message ----- From: Tonette Bihari, CMA Sent: 04/07/2016   1:46 PM To: Alfredia Ferguson, PA-C Subject: [name]                           Amy, Wanted to remind you that Dr. Gwenlyn Found does not want to interrupt her Brilinta until Feb 2018. Procedure is for 05-25-2016.  April Short

## 2016-05-04 NOTE — Telephone Encounter (Signed)
Finally received records from Dr. Jorene Minors office in [place], [place].  Copy of colonoscopy and path report. They also send other records, CT of the head, progress notes , some labs. These are all prior to 10/2015.  That is the last date she was seen at their office. Gave these records to [facility] PA-C to review.

## 2016-05-05 ENCOUNTER — Encounter: Payer: Self-pay | Admitting: Physician Assistant

## 2016-05-07 ENCOUNTER — Ambulatory Visit (HOSPITAL_COMMUNITY)
Admission: RE | Admit: 2016-05-07 | Discharge: 2016-05-07 | Disposition: A | Discharge status: Home / Self Care | Payer: Medicare Other | Source: Ambulatory Visit | Attending: Physician Assistant | Admitting: Physician Assistant

## 2016-05-07 DIAGNOSIS — K921 Melena: Secondary | ICD-10-CM | POA: Insufficient documentation

## 2016-05-07 DIAGNOSIS — D649 Anemia, unspecified: Secondary | ICD-10-CM | POA: Diagnosis not present

## 2016-05-07 DIAGNOSIS — K219 Gastro-esophageal reflux disease without esophagitis: Secondary | ICD-10-CM | POA: Diagnosis not present

## 2016-05-18 ENCOUNTER — Encounter: Payer: Self-pay | Admitting: Nurse Practitioner

## 2016-05-18 ENCOUNTER — Ambulatory Visit (INDEPENDENT_AMBULATORY_CARE_PROVIDER_SITE_OTHER): Payer: Medicare Other | Admitting: Nurse Practitioner

## 2016-05-18 ENCOUNTER — Encounter (INDEPENDENT_AMBULATORY_CARE_PROVIDER_SITE_OTHER): Payer: Self-pay

## 2016-05-18 VITALS — BP 98/58 | HR 76 | Ht 63.0 in | Wt 132.2 lb

## 2016-05-18 DIAGNOSIS — I110 Hypertensive heart disease with heart failure: Secondary | ICD-10-CM | POA: Diagnosis not present

## 2016-05-18 DIAGNOSIS — Z9861 Coronary angioplasty status: Secondary | ICD-10-CM

## 2016-05-18 DIAGNOSIS — E1169 Type 2 diabetes mellitus with other specified complication: Secondary | ICD-10-CM | POA: Insufficient documentation

## 2016-05-18 DIAGNOSIS — I5032 Chronic diastolic (congestive) heart failure: Secondary | ICD-10-CM | POA: Insufficient documentation

## 2016-05-18 DIAGNOSIS — E119 Type 2 diabetes mellitus without complications: Secondary | ICD-10-CM | POA: Insufficient documentation

## 2016-05-18 DIAGNOSIS — I255 Ischemic cardiomyopathy: Secondary | ICD-10-CM | POA: Insufficient documentation

## 2016-05-18 DIAGNOSIS — I2510 Atherosclerotic heart disease of native coronary artery without angina pectoris: Secondary | ICD-10-CM | POA: Insufficient documentation

## 2016-05-18 DIAGNOSIS — I5022 Chronic systolic (congestive) heart failure: Secondary | ICD-10-CM

## 2016-05-18 DIAGNOSIS — I119 Hypertensive heart disease without heart failure: Secondary | ICD-10-CM | POA: Insufficient documentation

## 2016-05-18 DIAGNOSIS — E785 Hyperlipidemia, unspecified: Secondary | ICD-10-CM | POA: Insufficient documentation

## 2016-05-18 NOTE — Progress Notes (Signed)
Office Visit    Patient Name: April Short Date of Encounter: 05/18/2016  Primary Care Provider:  Karis Juba, PA-C Primary Cardiologist:  Adora Fridge, MD   Chief Complaint    81 year old female with history of CAD, ischemic cardiopathy, and chronic systolic CHF, who presents for follow-up.  Past Medical History    Past Medical History:  Diagnosis Date  . Anemia   . Arthritis   . CAD (coronary artery disease)    a. 10/2015 PCI [facility], [place]): OM (2.5x30 Resolute DES), LCX (3.5x12 Resolute DES).  . Cervical cancer ([place]) 2005  . Chronic systolic CHF (congestive heart failure) ([place])    a. 01/2016 Echo: EF 20-25%, diff HK, septal-lateral dyssynchrony. Mild MR, mod to sev dil LA, nl RV, mod dil RA, mod TR, PASP 29mmHg.  . Colon cancer ([place]) 2001  . DM (diabetes mellitus) ([place])   . HLD (hyperlipidemia)   . Hypertensive heart disease   . Ischemic cardiomyopathy    a. 01/2016 Echo: EF 20-25%.   Past Surgical History:  Procedure Laterality Date  . COLON RESECTION    . CORONARY STENT PLACEMENT    . TOTAL ABDOMINAL HYSTERECTOMY      Allergies  No Known Allergies  History of Present Illness    81 year old female with the above complex past medical history including CAD status post PCI and drug-eluting stent placement to the obtuse marginal and left circumflex. She also has LV dysfunction and systolic CHF, with EF 0000000 by echo in May 2017. She was last seen in June, at which time she was doing relatively well. Her daughter is here with her today. Since her last visit, she has continued to do well and per her daughter, even better. She is fairly active around their home and has not been experiencing any chest pain, dyspnea, PND, orthopnea, dizziness, syncope, edema, or early satiety. Her weight has been stable on their home scale approximately 125 pounds. She is tolerating all her medications well. They prepare their own meals under careful with salt.  Home  Medications    Prior to Admission medications   Medication Sig Start Date End Date Taking? Authorizing Provider  aspirin EC 81 MG tablet Take 81 mg by mouth daily.   Yes Historical Provider, MD  carvedilol (COREG) 6.25 MG tablet Take 1 tablet (6.25 mg total) by mouth 2 (two) times daily with a meal. 02/02/16  Yes Orlena Sheldon, PA-C  ferrous sulfate 325 (65 FE) MG tablet Take 1 tablet (325 mg total) by mouth 2 (two) times daily with a meal. 04/26/16  Yes Irene Shipper, MD  furosemide (LASIX) 40 MG tablet Take 40 mg by mouth daily.   Yes Historical Provider, MD  glipiZIDE (GLUCOTROL XL) 10 MG 24 hr tablet Take 10 mg by mouth daily with breakfast.   Yes Historical Provider, MD  iron polysaccharides (NU-IRON) 150 MG capsule Take 1 capsule (150 mg total) by mouth 2 (two) times daily. 04/05/16  Yes Amy S Esterwood, PA-C  KLOR-CON M20 20 MEQ tablet TAKE 1 TABLET (20 MEQ TOTAL) BY MOUTH 2 (TWO) TIMES DAILY. 03/15/16  Yes Mary B Dixon, PA-C  omeprazole (PRILOSEC) 20 MG capsule Take 1 capsule (20 mg total) by mouth daily. 04/26/16  Yes Irene Shipper, MD  polyethylene glycol [facility] Flor[facility]aveling) packet Take 17 g by mouth daily as needed.   Yes Historical Provider, MD  sacubitril-valsartan (ENTRESTO) 24-26 MG Take 1 tablet by mouth 2 (two) times daily. 01/15/16  Yes Orlena Sheldon, PA-C  sitaGLIPtin (JANUVIA) 100 MG tablet Take 1 tablet (100 mg total) by mouth daily. 01/26/16  Yes Orlena Sheldon, PA-C  spironolactone (ALDACTONE) 25 MG tablet Take 25 mg by mouth daily.   Yes Historical Provider, MD  ticagrelor (BRILINTA) 90 MG TABS tablet Take by mouth 2 (two) times daily.   Yes Historical Provider, MD    Review of Systems    As above, she's been doing exceptionally well since her last visit. She denies chest pain, palpitations, dyspnea, pnd, orthopnea, n, v, dizziness, syncope, edema, weight gain, or early satiety.  All other systems reviewed and are otherwise negative except as noted above.  Physical Exam    VS:  BP  (!) 98/58   Pulse 76   Ht 5\' 3"  (1.6 m)   Wt 132 lb 3.2 oz (60 kg)   BMI 23.42 kg/m  , BMI Body mass index is 23.42 kg/m. GEN: Well nourished, well developed, in no acute distress.  HEENT: normal.  Neck: Supple, no JVD, carotid bruits, or masses. Cardiac: RRR, Distant heart sounds, no murmurs, rubs, or gallops. No clubbing, cyanosis, edema.  Radials/DP/PT 2+ and equal bilaterally.  Respiratory:  Respirations regular and unlabored, clear to auscultation bilaterally. GI: Soft, nontender, nondistended, BS + x 4. MS: no deformity or atrophy. Skin: warm and dry, no rash. Neuro:  Strength and sensation are intact. Psych: Normal affect.  Accessory Clinical Findings    May 2015 echocardiogram reviewed in detail with patient and family.  Assessment & Plan    1.  Coronary artery disease: Status post successful PCI and drug-eluting stent placement to the left circumflex and obtuse marginal in [place], [place] in February 2017. She has done well since then and remains on aspirin, beta blocker, and Brilinta. She is not on a statin, though it is not clear as to why not. We will need to readdress this on her next visit.  2. Ischemic cardiomyopathy/chronic systolic congestive heart failure: Patient is euvolemic on exam today. She is doing well at home and her weight has been stable. She is currently only taking Lasix 40 mg daily. She is otherwise on beta blocker, spironolactone, and Entresto. We discussed the importance of daily weights, sodium restriction, medication compliance, and symptom reporting and she verbalizes understanding.  I spoke today with her and her daughter about her LV dysfunction noted on echo in May, despite good medical therapy. We briefly discussed the role of defibrillator therapy and at this point I have recommended additional reading. We have given them some information about defibrillators. We can readdress this on follow-up visit.  3. Hypertensive heart disease: Blood  pressure is stable and if anything on the low side. Patient is stable and he symptomatically. Continue beta blocker, diuretic, Entresto, and spironolactone.  4. Lipids: LDL was 81 in May. She has not been on a statin. Given coronary disease and diabetes, we should have a low threshold to initiate unless prior intolerance has been documented.  Pt left office before this could be discussed today.  5. Type II DM:  Managed by primary care.  6.  Disposition: Follow-up with Dr. Alvester Chou in 3 months or sooner if necessary.  Murray Hodgkins, NP 05/18/2016, 10:53 AM

## 2016-05-18 NOTE — Patient Instructions (Signed)
Medication Instructions:  Your physician recommends that you continue on your current medications as directed. Please refer to the Current Medication list given to you today.  Labwork: None Ordered  Testing/Procedures: None Ordered  Follow-Up: Your physician recommends that you schedule a follow-up appointment in: 3 MONTH with DR BERRY  Any Other Special Instructions Will Be Listed Below (If Applicable).     If you need a refill on your cardiac medications before your next appointment, please call your pharmacy.

## 2016-05-20 ENCOUNTER — Other Ambulatory Visit: Payer: Self-pay | Admitting: Physician Assistant

## 2016-05-20 DIAGNOSIS — E1149 Type 2 diabetes mellitus with other diabetic neurological complication: Secondary | ICD-10-CM

## 2016-05-25 ENCOUNTER — Encounter (HOSPITAL_COMMUNITY): Admission: RE | Payer: Self-pay | Source: Ambulatory Visit

## 2016-05-25 ENCOUNTER — Ambulatory Visit (HOSPITAL_COMMUNITY): Admission: RE | Admit: 2016-05-25 | Payer: Medicare Other | Source: Ambulatory Visit | Admitting: Internal Medicine

## 2016-05-25 SURGERY — COLONOSCOPY WITH PROPOFOL
Anesthesia: Monitor Anesthesia Care

## 2016-05-28 ENCOUNTER — Ambulatory Visit (INDEPENDENT_AMBULATORY_CARE_PROVIDER_SITE_OTHER): Payer: Medicare Other | Admitting: Podiatry

## 2016-05-28 ENCOUNTER — Encounter: Payer: Self-pay | Admitting: Podiatry

## 2016-05-28 DIAGNOSIS — E1142 Type 2 diabetes mellitus with diabetic polyneuropathy: Secondary | ICD-10-CM

## 2016-05-28 DIAGNOSIS — M79676 Pain in unspecified toe(s): Secondary | ICD-10-CM

## 2016-05-28 DIAGNOSIS — B351 Tinea unguium: Secondary | ICD-10-CM | POA: Diagnosis not present

## 2016-05-28 NOTE — Progress Notes (Signed)
SUBJECTIVE Patient with a history of diabetes mellitus presents to office today complaining of elongated, thickened nails. Pain while ambulating in shoes. Patient is unable to trim their own nails.   No Known Allergies  OBJECTIVE General Patient is awake, alert, and oriented x 3 and in no acute distress. Derm Skin is dry and supple bilateral. Negative open lesions or macerations. Remaining integument unremarkable. Nails are tender, long, thickened and dystrophic with subungual debris, consistent with onychomycosis, 1-5 bilateral. No signs of infection noted. Vasc  DP and PT pedal pulses palpable bilaterally. Temperature gradient within normal limits.  Neuro Epicritic and protective threshold sensation diminished bilaterally.  Musculoskeletal Exam No symptomatic pedal deformities noted bilateral. Muscular strength within normal limits.  ASSESSMENT 1. Diabetes Mellitus w/ peripheral neuropathy 2. Onychomycosis of nail due to dermatophyte bilateral 3. Pain in foot bilateral  PLAN OF CARE Patient evaluated today. Instructed to maintain good pedal hygiene and foot care. Stressed importance of controlling blood sugar.  Mechanical debridement of nails 1-5 bilaterally performed using a nail nipper. Filed with dremel without incident.  All patient questions were answered. Return to clinic in 3 mos.    Edrick Kins, DPM

## 2016-06-07 ENCOUNTER — Telehealth: Payer: Self-pay

## 2016-06-07 NOTE — Telephone Encounter (Signed)
-----   Message from Algernon Huxley, RN sent at 04/27/2016 11:02 AM EDT ----- Regarding: Labs Pt needs labs in 6 weeks, orders in epic, call daughter

## 2016-06-07 NOTE — Telephone Encounter (Signed)
Letter mailed to pt reminding her of lab.

## 2016-07-13 ENCOUNTER — Other Ambulatory Visit: Payer: Self-pay

## 2016-07-13 DIAGNOSIS — E1149 Type 2 diabetes mellitus with other diabetic neurological complication: Secondary | ICD-10-CM

## 2016-07-13 MED ORDER — SITAGLIPTIN PHOSPHATE 100 MG PO TABS: 100.0000 mg | ORAL_TABLET | Freq: Every day | ORAL | 0 refills | Status: DC

## 2016-07-13 NOTE — Telephone Encounter (Signed)
Pharmacy req 90 supply of Januvia, but pt need f/u OV . 30 day filled letter sent out to make an appt

## 2016-07-14 ENCOUNTER — Other Ambulatory Visit: Payer: Self-pay

## 2016-07-14 DIAGNOSIS — E1149 Type 2 diabetes mellitus with other diabetic neurological complication: Secondary | ICD-10-CM

## 2016-07-20 ENCOUNTER — Other Ambulatory Visit: Payer: Self-pay

## 2016-07-20 ENCOUNTER — Other Ambulatory Visit: Payer: Self-pay | Admitting: Physician Assistant

## 2016-07-20 MED ORDER — POTASSIUM CHLORIDE CRYS ER 20 MEQ PO TBCR: EXTENDED_RELEASE_TABLET | ORAL | 0 refills | Status: DC

## 2016-07-20 NOTE — Telephone Encounter (Signed)
RX refilled per protocol . Pt need appt before 90 supply can be dispensed

## 2016-07-29 ENCOUNTER — Encounter: Payer: Self-pay | Admitting: Physician Assistant

## 2016-07-29 ENCOUNTER — Telehealth: Payer: Self-pay | Admitting: Physician Assistant

## 2016-07-29 MED ORDER — SACUBITRIL-VALSARTAN 24-26 MG PO TABS: 1.0000 | ORAL_TABLET | Freq: Two times a day (BID) | ORAL | 0 refills | Status: DC

## 2016-07-29 NOTE — Telephone Encounter (Signed)
Rx refilled per protocol. Pt need to sch an appt before anymore refills/letter sent

## 2016-07-29 NOTE — Telephone Encounter (Signed)
Amber from [employer] about this patient entresto please call her back at [phone]

## 2016-08-17 ENCOUNTER — Ambulatory Visit: Payer: Medicare Other | Admitting: Cardiovascular Disease

## 2016-08-27 ENCOUNTER — Encounter: Payer: Self-pay | Admitting: Internal Medicine

## 2016-08-28 ENCOUNTER — Other Ambulatory Visit: Payer: Self-pay | Admitting: Physician Assistant

## 2016-09-02 ENCOUNTER — Ambulatory Visit (INDEPENDENT_AMBULATORY_CARE_PROVIDER_SITE_OTHER): Payer: Medicare Other | Admitting: Physician Assistant

## 2016-09-02 ENCOUNTER — Encounter: Payer: Self-pay | Admitting: Physician Assistant

## 2016-09-02 VITALS — BP 99/40 | HR 80 | Temp 97.8°F | Resp 14 | Wt 134.0 lb

## 2016-09-02 DIAGNOSIS — I255 Ischemic cardiomyopathy: Secondary | ICD-10-CM

## 2016-09-02 DIAGNOSIS — I2510 Atherosclerotic heart disease of native coronary artery without angina pectoris: Secondary | ICD-10-CM | POA: Diagnosis not present

## 2016-09-02 DIAGNOSIS — G629 Polyneuropathy, unspecified: Secondary | ICD-10-CM | POA: Diagnosis not present

## 2016-09-02 DIAGNOSIS — I5022 Chronic systolic (congestive) heart failure: Secondary | ICD-10-CM

## 2016-09-02 DIAGNOSIS — D509 Iron deficiency anemia, unspecified: Secondary | ICD-10-CM | POA: Diagnosis not present

## 2016-09-02 DIAGNOSIS — E785 Hyperlipidemia, unspecified: Secondary | ICD-10-CM

## 2016-09-02 DIAGNOSIS — E1149 Type 2 diabetes mellitus with other diabetic neurological complication: Secondary | ICD-10-CM | POA: Diagnosis not present

## 2016-09-02 NOTE — Progress Notes (Signed)
Patient ID: April Short MRN: [id number], DOB: [birth year], 81 y.o. Date of Encounter: @DATE @  Chief Complaint:  No chief complaint on file.  HPI: 81 y.o. year old female  presents with her daughter for OV today.    01/14/2016: She is here as a new patient to establish care with our practice. I do not have records yet. The following history is provided by her daughter today. Pt does not speak much English. Daughter translates/interprets.   Daughter states the patient just recently moved here from [place]--2 months ago. She was living with her other daughter in [place]. She reports that she recently got 2 stents to her heart February 2017 and at that time was told that she also has congestive heart failure. Says that prior to that, patient was independent so the other sister could care for her. She states that now, since these cardiac issues, she is not as independent --so it was decided that she should come live with this daughter. This daughter states that her own husband passed away several months ago and she does not work so it made sense for patient to move with her.  She does have the card given to them from the stent manufacturers. She received a stent to the OM and a stent to the circumflex on 10/31/15. Daughter states that "her heart is pumping less than 50% "  She says the patient had colon cancer in 2001 and had surgery. Says that she had cervical cancer in 2004 and had a complete hysterectomy. Says that she has had diabetes for 23 years. ------ notes that metformin was stopped secondary to GI adverse effects in the past  Says that her mom made it through all of these other medical problems and remained independent until these issues with her heart recently.  States that patient developed swelling and shortness of breath and went to her primary care physician in [place] and she then was set up with Cardiologist and had a nuclear test, then cath with stents.  Daughter  also notes that patient had been on lisinopril but then was switched to [facility].  They report that when living in [place], she saw her Primary Care Physician, Cardiology, Podiatry. Had no other specialists. Says that she saw a podiatrist for diabetic foot care-- nails clipped  etc. secondary to her diabetes. Had no other specific problems with her feet.  Also reports the patient has significant diabetic neuropathy and that it is mostly affects her hands. Says that she "can do nothing with her hands ".   AT THAT OV, CHECKED LABS INCLUDING BNP.  BNP came back elevated at 2185.  BUN 22. Creatinine 1.03.  At that time:  Increased Lasix from 40mg  QD to 80mg  BID (morning and noon)  Added new--KDur 45meq BID (morning and noon)  --had been on no potassium supplement prior to that---on Spironolactone Told to f/u Monday (01/19/2016)   01/19/2016: Today reports that she is feeling so much better. Says that the swelling has decreased a lot. Says that she had the best weekend she has had since she came from [place]. Says that she was so much more alert, has been sleeping better, eating better. Daughter says that she weighs patient daily and she has decreased 15 pounds. Says that she is walking more. Says that her cough has almost disappeared. Says that before last week's visit-- when she would walk from the chair to the refrigerator, she was short of breath and breathing hard.  Says now she can walk that distance with no problems. Daughter states the patient sleeps with a cushion and then a pillow on top of that and is propped up that way. Says that she has continued this. However is not coughing at night now. Daughter has noticed that patient's blood pressure is lower now but pt says she is not feeling lightheaded and is not having any symptoms with this. Daughter shows me readings that she checks on patient every day. Every day she checks-- weight, blood sugar, blood pressure, and oxygen saturation.  AT  THAT TIME: Told to take Lasix 80mg  QAM and KDur 56meq QAM. F/U 1 week.  01/26/2016:  She had OV with Cardiology--Dr. Berry--01/21/2016.  I reviewed his OV note.  He continued meds same with no change.  Felt that she was at dry weight. Weight that day was 136.  Lungs were clear. No JVD.  Today daughter says that Dr. Gwenlyn Found explained to them that she will always have some swelling. And that he told her to continue daily weights and to call if weight increases.  She is scheduled for Echo on May 17th.  Today they say she still feeling so much better. Says they "went shopping yesterday and she was walking instead of using wheelchair. Sleeping better now too". Daughter says that yesterday patient said "April Short is back."  No complaint/concern today. Does want referral to Podiatrist--to clip her nails"--she saw Podiatrist for this in past in [place].  AT OV 01/26/2016----ORDERED : REFERRAL TO PODIATRY---------she saw podiatry in [facility] "to clip her nails" --wants f/u with podiatry for this. BMET HEME OCCULT X3 JANUVIA 100MG  QD  02/02/2016: Pt and daughter report she had wonderful Mother's Day. Was with multiple of her children (including her son, April Short, who recently established care with me but unfortunately has gastric cancer and recently started hospice.  Turned in [employer] cards today. At this visit, I gave her contact info for her to schedule Diabetic eye exam.     09/03/2016: Patient's daughter accompanies her again today and translates.  Reports that reason pt went a whil between OVs was that patinet's son (who was pt of mine also) passed away from his cnacer. Pt was "very emotional". Pt went and stayed with other children in [place] for 2 months.  Says that now she is better--"eating, drinking, sleeping good now." Says that pt turns 81 on Feb 3rd. Is wnating to go to "her country" ([place]) and plan a big party there.  Wants to go there to be there for [place], then plan the party, have the party, and return around Feb 15th.  Says pt knows it will be the last time to go there--wants to "say good bye".  Says she has appt with Cardiology tomorrow--wanted to check with me and Cardiology to see if thnk she go to [place].  She checks BS every morning--- Usually gets 110, 120, 130--sometimes 80, 90, 100--"depends on what she ate the night before---never < 80 " Daughter also checks her BP routinely---gets low readings but she asks pt if she is lightheaded and she never is lightheaded.  Also checks her oxygne level and "that is good too".    Past Medical History:  Diagnosis Date  . Anemia   . Arthritis   . CAD (coronary artery disease)    a. 10/2015 PCI [facility] [place]): OM (2.5x30 Resolute DES), LCX (3.5x12 Resolute DES).  . Cervical cancer ([place]) 2005  . Chronic systolic CHF (congestive  heart failure) ([place])    a. 01/2016 Echo: EF 20-25%, diff HK, septal-lateral dyssynchrony. Mild MR, mod to sev dil LA, nl RV, mod dil RA, mod TR, PASP 62mmHg.  . Colon cancer ([place]) 2001  . DM (diabetes mellitus) ([place])   . HLD (hyperlipidemia)   . Hypertensive heart disease   . Ischemic cardiomyopathy    a. 01/2016 Echo: EF 20-25%.     Home Meds:    Medication List       .               aspirin EC 81 MG tablet  Take 81 mg by mouth daily.     BRILINTA 90 MG Tabs tablet  Generic drug:  ticagrelor  Take by mouth 2 (two) times daily.     carvedilol 6.25 MG tablet  Commonly known as:  COREG  Take 6.25 mg by mouth 2 (two) times daily with a meal.     DIABETIC TUSSIN MAX ST PO  Take by mouth as needed.     ENTRESTO 24-26 MG  Generic drug:  sacubitril-valsartan  Take 1 tablet by mouth 2 (two) times daily.     furosemide 40 MG tablet  Commonly known as:  LASIX  Take 40 mg by mouth daily.     glipiZIDE 10 MG 24 hr tablet  Commonly known as:  GLUCOTROL XL  Take 10 mg by mouth daily with breakfast.     polyethylene glycol packet    Commonly known as:  MIRALAX / GLYCOLAX  Take 17 g by mouth daily as needed.     spironolactone 25 MG tablet  Commonly known as:  ALDACTONE  Take 25 mg by mouth daily.           Allergies: No Known Allergies  Social History   Social History  . Marital status: Married    Spouse name: N/A  . Number of children: 7  . Years of education: N/A   Occupational History  . Not on file.   Social History Main Topics  . Smoking status: Never Smoker  . Smokeless tobacco: Never Used  . Alcohol use 0.0 oz/week     Comment: occasionally  . Drug use: No  . Sexual activity: Not on file   Other Topics Concern  . Not on file   Social History Narrative  . No narrative on file    Family History  Problem Relation Age of Onset  . Colon cancer Mother   . Stomach cancer Son   . Diabetes Sister      Review of Systems:  See HPI for pertinent ROS. All other ROS negative.    Physical Exam: Blood pressure (!) 99/40, pulse 80, temperature 97.8 F (36.6 C), temperature source Oral, resp. rate 14, weight 134 lb (60.8 kg), SpO2 97 %., There is no height or weight on file to calculate BMI. General: WNWD Hispanic Female. Does not speak [place]. She is smiling today!! (At her first OV here, had very flat affect) Appears in no acute distress. Neck: Supple. No thyromegaly. No lymphadenopathy. No JVD. No carotid bruits. Lungs: Clear. Heart: RRR with S1 S2. No murmurs, rubs, or gallops. Abdomen: Soft, non-tender, non-distended with normoactive bowel sounds. No hepatomegaly. No rebound/guarding. No obvious abdominal masses. Musculoskeletal:  Strength and tone normal for age. Extremities/Skin: Diabetic Foot Exam: Inspection normal. Very good skin care. No  problem areas at all.  No LE edema.  Neuro: Alert and oriented X 3. Moves all extremities spontaneously. Gait is normal.  CNII-XII grossly in tact. Psych:  Responds to questions appropriately with a normal affect.     ASSESSMENT AND PLAN:   81 y.o. year old female with  1. Encounter to establish care We currently have no records. At initial Ov 01/14/2016-- she signed a medical release form for her prior primary care provider as well as her cardiologist. I have not received any records yet--as of 02/02/2016---- 09/02/2016---Never received records  2. DM (diabetes mellitus), type 2 with neurological complications ([place]) Note the daughter states that in the past metformin was stopped secondary to GI adverse effects. ACE inhibitor was changed to ARB in [place] She is on no statin. ---FLP performed 01/2016 shows LDL 81. Tri-76, HDL-39 -----------------01/14/2016---checked A1C---came back at 9.2 --------------------------------------------------- ----------------Once we get her CHF stable, then I will f/u this with pt at an OV------------------------- At OV 01/26/2016----Added Januvia 100mg  QD.  At [place] 5/8/017--Ordered referal to podiatry for foot care At [place] 02/02/2016--Gave contact info for Diabetic Eye Exam 09/02/2016---Check A1C and MicroAlbumin and kidney function  Lipids --Good. 01/21/2016---Trig--76. HDL--39.  LDL--81  3. Coronary artery disease involving native coronary artery of native heart without angina pectoris This is currently stable. This will be managed by cardiology. -01/14/2016--Ordered referral to Cardiology She had OV with Cardiology--Dr. Berry--01/21/2016---he made no changes to meds. Ordered Echo--scheduled for May 17th. 09/02/2016---She has appt to see Cardiology tomorrow  4. Chronic systolic heart failure (HCC) At her initial OV--01/14/2016--was on Lasix 40mg  QD (was on no K--was on spironolactone) At that time, when reviewed BNP---increased to 80mg  BID (added K26meq BID [in addition to spironolactone] )  Weight 01/14/2016-----155 Weight 01/19/2016-------140 Weight 01/26/2016-------133 Weight 09/02/2016----134 At OV 01/19/2016---changed dose to 80mg  QD (QAM only) and to take KDur 57meq QD with this.  At OV  01/26/2016---continued Lasix 80mg  QAM and KDur 35meq Q AM.  She had appointment with Cardiology--Dr. Berry---01/21/2016----He felt that she was at her Dry Weight at that OV--weight 136 at that OV. He made no changes to meds. Ordered Echo, which is scheduled for May 17th.  09/02/2016---At dry weight. Lungs clear. No LE edema. No med changes at this time. Will f/u lab results---CMET--and has appt with Cardiology tomorrow.   5. Neuropathy ([place]) She is on no medication for neuropathy. Will follow-up with this in the future.  6. Anemia Lab 01/14/16 showed H/H    9.1/30.6. MCV and MCH low.  Anemia Panel added.  Iron low at 25. %sat low at 6.  B12--normal.  Folate--normal -[employer] cards turned in 02/02/2016-----cmae back positive.  Subsequently saw GI 09/02/2016-----Recheck CBC now to monitor.     ROV 3 months, sooner if needed.   [address] [place], [place], [facility] 09/02/2016 3:42 PM

## 2016-09-03 ENCOUNTER — Ambulatory Visit (INDEPENDENT_AMBULATORY_CARE_PROVIDER_SITE_OTHER): Payer: Medicare Other | Admitting: Cardiology

## 2016-09-03 ENCOUNTER — Other Ambulatory Visit: Payer: Self-pay

## 2016-09-03 ENCOUNTER — Encounter: Payer: Self-pay | Admitting: Cardiology

## 2016-09-03 VITALS — BP 90/50 | HR 77 | Ht 63.0 in | Wt 140.0 lb

## 2016-09-03 DIAGNOSIS — E119 Type 2 diabetes mellitus without complications: Secondary | ICD-10-CM

## 2016-09-03 DIAGNOSIS — N289 Disorder of kidney and ureter, unspecified: Secondary | ICD-10-CM | POA: Diagnosis not present

## 2016-09-03 DIAGNOSIS — I255 Ischemic cardiomyopathy: Secondary | ICD-10-CM

## 2016-09-03 DIAGNOSIS — I110 Hypertensive heart disease with heart failure: Secondary | ICD-10-CM | POA: Diagnosis not present

## 2016-09-03 DIAGNOSIS — E875 Hyperkalemia: Secondary | ICD-10-CM | POA: Insufficient documentation

## 2016-09-03 DIAGNOSIS — I447 Left bundle-branch block, unspecified: Secondary | ICD-10-CM | POA: Diagnosis not present

## 2016-09-03 LAB — CBC WITH DIFFERENTIAL/PLATELET
BASOS PCT: 0 %
Basophils Absolute: 0 cells/uL (ref 0–200)
EOS ABS: 240 {cells}/uL (ref 15–500)
Eosinophils Relative: 4 %
HCT: 33.3 % — ABNORMAL LOW (ref 35.0–45.0)
Hemoglobin: 10.9 g/dL — ABNORMAL LOW (ref 12.0–15.0)
LYMPHS PCT: 27 %
Lymphs Abs: 1620 cells/uL (ref 850–3900)
MCH: 30.7 pg (ref 27.0–33.0)
MCHC: 32.7 g/dL (ref 32.0–36.0)
MCV: 93.8 fL (ref 80.0–100.0)
MONOS PCT: 16 %
MPV: 9.4 fL (ref 7.5–12.5)
Monocytes Absolute: 960 cells/uL — ABNORMAL HIGH (ref 200–950)
Neutro Abs: 3180 cells/uL (ref 1500–7800)
Neutrophils Relative %: 53 %
PLATELETS: 187 10*3/uL (ref 140–400)
RBC: 3.55 MIL/uL — ABNORMAL LOW (ref 3.80–5.10)
RDW: 13.5 % (ref 11.0–15.0)
WBC: 6 10*3/uL (ref 3.8–10.8)

## 2016-09-03 LAB — COMPLETE METABOLIC PANEL WITH GFR
ALBUMIN: 4 g/dL (ref 3.6–5.1)
ALK PHOS: 90 U/L (ref 33–130)
ALT: 21 U/L (ref 6–29)
AST: 21 U/L (ref 10–35)
BILIRUBIN TOTAL: 0.4 mg/dL (ref 0.2–1.2)
BUN: 49 mg/dL — ABNORMAL HIGH (ref 7–25)
CALCIUM: 9.2 mg/dL (ref 8.6–10.4)
CO2: 25 mmol/L (ref 20–31)
Chloride: 101 mmol/L (ref 98–110)
Creat: 1.54 mg/dL — ABNORMAL HIGH (ref 0.60–0.93)
GFR, EST AFRICAN AMERICAN: 37 mL/min — AB (ref >=60)
GFR, EST NON AFRICAN AMERICAN: 32 mL/min — AB (ref >=60)
GLUCOSE: 211 mg/dL — AB (ref 70–99)
Potassium: 5.4 mmol/L — ABNORMAL HIGH (ref 3.5–5.3)
Sodium: 135 mmol/L (ref 135–146)
TOTAL PROTEIN: 7.8 g/dL (ref 6.1–8.1)

## 2016-09-03 LAB — HEMOGLOBIN A1C
Hgb A1c MFr Bld: 6.3 % — ABNORMAL HIGH (ref <5.7)
Mean Plasma Glucose: 134 mg/dL

## 2016-09-03 LAB — MICROALBUMIN, URINE: MICROALB UR: 1.6 mg/dL

## 2016-09-03 MED ORDER — OMEPRAZOLE 20 MG PO CPDR: 20.0000 mg | DELAYED_RELEASE_CAPSULE | Freq: Every day | ORAL | 2 refills | Status: DC

## 2016-09-03 MED ORDER — FUROSEMIDE 20 MG PO TABS
20.0000 mg | ORAL_TABLET | Freq: Every day | ORAL | 5 refills | Status: DC
Start: 2016-09-03 — End: 2016-10-19

## 2016-09-03 NOTE — Assessment & Plan Note (Signed)
Chronic. 

## 2016-09-03 NOTE — Assessment & Plan Note (Signed)
On oral agents 

## 2016-09-03 NOTE — Patient Instructions (Signed)
Medication Instructions:  STOP POTASSIUM  STOP ALDACTONE    If you need a refill on your cardiac medications before your next appointment, please call your pharmacy.  Labwork: BMP ON Tuesday 09-07-2016  Testing/Procedures: NONE  Follow-Up: Your physician recommends that you schedule a follow-up appointment ON TUESDAY 09-07-2016 WITH Kerin Ransom  Your physician wants you to follow-up in: [place] will receive a reminder letter in the mail two months in advance. If you don't receive a letter, please call our office to schedule the follow-up appointment.    Any Other Special Instructions Will Be Listed Below (If Applicable). MAKE SURE TO BRING ALL OF YOUR MEDICATIONS WITH YOU ON Tuesday FOR YOUR FOLLOW UP WITH [facility] [place]   HAPPY HOLIDAYS!   Thank you for choosing [facility]!!

## 2016-09-03 NOTE — Progress Notes (Signed)
09/03/2016 April Short   Sep 12, 1935  [id number]  Primary Physician Karis Juba, PA-C Primary Cardiologist: Dr Gwenlyn Found  HPI:  Pleasant 81 y/o female from [place]. She has a history of CAD and had CFX and OM stenting in [facility] Feb 2017. She moved her with her daughter to help care for her brother who had end stage stomach cancer. An echo done May 2017 showed sever LVD with an EF of 20-25%. She was placed on medical Rx including Entresto. Her brother did pas away and her daughter tells me the patient has been doing better, increased activity, increased appetite, and increased social interaction. She now says she has hard time keeping up with her mother when they go shopping. I suspect the improvement is a combination of resolving grief over her brothers death and medical Rx for CHF.   The pt wants to go to [place] for 3 months. She was sent her for clearance and to get 3 months Rx for medications to take with her. She denies any orthopnea, unusual dyspnea, or chest pain. She has not had edema. Rect labs did show a bump in her SCr to 1.5 and her K+ to 5.4.    Current Outpatient Prescriptions  Medication Sig Dispense Refill  . aspirin EC 81 MG tablet Take 81 mg by mouth daily.    . carvedilol (COREG) 6.25 MG tablet Take 1 tablet (6.25 mg total) by mouth 2 (two) times daily with a meal. 180 tablet 2  . ENTRESTO 24-26 MG TAKE 1 TABLET BY MOUTH TWICE A DAY. PT NEEDS OFFICE VISIT BEFORE ANY FURTHER REFILLS. 60 tablet 0  . ferrous sulfate 325 (65 FE) MG tablet Take 1 tablet (325 mg total) by mouth 2 (two) times daily with a meal. 60 tablet 11  . furosemide (LASIX) 20 MG tablet Take 1 tablet (20 mg total) by mouth daily. 30 tablet 5  . glipiZIDE (GLUCOTROL XL) 10 MG 24 hr tablet Take 10 mg by mouth daily with breakfast.    . KLOR-CON M20 20 MEQ tablet TAKE 1 TABLET (20 MEQ TOTAL) BY MOUTH 2 (TWO) TIMES DAILY. 60 tablet 5  . polyethylene glycol (MIRALAX / GLYCOLAX) packet Take 17 g by mouth daily as  needed.    . sitaGLIPtin (JANUVIA) 100 MG tablet Take 1 tablet (100 mg total) by mouth daily. Patient need to sch office visit before anymore refills 30 tablet 0  . ticagrelor (BRILINTA) 90 MG TABS tablet Take by mouth 2 (two) times daily.    Marland Kitchen omeprazole (PRILOSEC) 20 MG capsule Take 1 capsule (20 mg total) by mouth daily. 90 capsule 2   No current facility-administered medications for this visit.     No Known Allergies  Social History   Social History  . Marital status: Married    Spouse name: N/A  . Number of children: 7  . Years of education: N/A   Occupational History  . Not on file.   Social History Main Topics  . Smoking status: Never Smoker  . Smokeless tobacco: Never Used  . Alcohol use 0.0 oz/week     Comment: occasionally  . Drug use: No  . Sexual activity: Not on file   Other Topics Concern  . Not on file   Social History Narrative  . No narrative on file     Review of Systems: General: negative for chills, fever, night sweats or weight changes.  Cardiovascular: negative for chest pain, dyspnea on exertion, edema, orthopnea, palpitations, paroxysmal nocturnal dyspnea or  shortness of breath Dermatological: negative for rash Respiratory: negative for cough or wheezing Urologic: negative for hematuria Abdominal: negative for nausea, vomiting, diarrhea, bright red blood per rectum, melena, or hematemesis Neurologic: negative for visual changes, syncope, or dizziness All other systems reviewed and are otherwise negative except as noted above.    Blood pressure (!) 90/50, pulse 77, height 5\' 3"  (1.6 m), weight 140 lb (63.5 kg), SpO2 98 %.  General appearance: alert, cooperative and no distress Neck: no carotid bruit and no JVD Lungs: clear to auscultation bilaterally Heart: regular rate and rhythm Extremities: extremities normal, atraumatic, no cyanosis or edema Skin: Skin color, texture, turgor normal. No rashes or lesions Neurologic: Grossly  normal   ASSESSMENT AND PLAN:   Ischemic cardiomyopathy  01/2016 Echo: EF 20-25%.  Chronic systolic CHF (congestive heart failure) ([place]) a. 01/2016 Echo: EF 20-25%, diff HK, septal-lateral dyssynchrony. Mild MR, mod to sev dil LA, nl RV, mod dil RA, mod TR, PASP 64mmHg. B. Chronically elevated BNP  No CHF on exam today  CAD S/P percutaneous coronary angioplasty a. 10/2015 PCI [facility] [place]): OM (2.5x30 Resolute DES), LCX (3.5x12 Resolute DES). No angina  Acute renal insufficiency SCr 1.54 (new) on 09/02/16 labs  Hyperkalemia K+ 5.4 on labs 09/02/16  LBBB (left bundle branch block) Chronic  Non-insulin dependent type 2 diabetes mellitus ([place]) On oral agents  Hypertensive heart disease B/P is low today   PLAN  I suggested we stop her K+ supplement and her Aldactone, and I decreased her Lasix to 20 mg daily. She'll return next week for a BMP and B/P check. I asked her daughter to bring all her medications with her as there was a little confusion about what she was taking.   Kerin Ransom PA-C 09/03/2016 4:16 PM

## 2016-09-03 NOTE — Assessment & Plan Note (Signed)
K+ 5.4 on labs 09/02/16

## 2016-09-03 NOTE — Assessment & Plan Note (Signed)
BP is low today

## 2016-09-03 NOTE — Assessment & Plan Note (Signed)
01/2016 Echo: EF 20-25%.

## 2016-09-03 NOTE — Assessment & Plan Note (Signed)
SCr 1.54 (new) on 09/02/16 labs

## 2016-09-03 NOTE — Assessment & Plan Note (Signed)
a. 01/2016 Echo: EF 20-25%, diff HK, septal-lateral dyssynchrony. Mild MR, mod to sev dil LA, nl RV, mod dil RA, mod TR, PASP 39mmHg. B. Chronically elevated BNP  No CHF on exam today

## 2016-09-03 NOTE — Assessment & Plan Note (Signed)
a. 10/2015 PCI [facility], [place]): OM (2.5x30 Resolute DES), LCX (3.5x12 Resolute DES). No angina

## 2016-09-07 ENCOUNTER — Ambulatory Visit (INDEPENDENT_AMBULATORY_CARE_PROVIDER_SITE_OTHER): Payer: Medicare Other | Admitting: Cardiology

## 2016-09-07 ENCOUNTER — Encounter: Payer: Self-pay | Admitting: Cardiology

## 2016-09-07 VITALS — BP 110/57 | HR 83 | Ht 63.0 in | Wt 140.8 lb

## 2016-09-07 DIAGNOSIS — I5022 Chronic systolic (congestive) heart failure: Secondary | ICD-10-CM

## 2016-09-07 DIAGNOSIS — N289 Disorder of kidney and ureter, unspecified: Secondary | ICD-10-CM | POA: Diagnosis not present

## 2016-09-07 DIAGNOSIS — Z79899 Other long term (current) drug therapy: Secondary | ICD-10-CM | POA: Diagnosis not present

## 2016-09-07 DIAGNOSIS — E875 Hyperkalemia: Secondary | ICD-10-CM

## 2016-09-07 DIAGNOSIS — I110 Hypertensive heart disease with heart failure: Secondary | ICD-10-CM | POA: Diagnosis not present

## 2016-09-07 DIAGNOSIS — E119 Type 2 diabetes mellitus without complications: Secondary | ICD-10-CM

## 2016-09-07 DIAGNOSIS — I255 Ischemic cardiomyopathy: Secondary | ICD-10-CM

## 2016-09-07 DIAGNOSIS — I2510 Atherosclerotic heart disease of native coronary artery without angina pectoris: Secondary | ICD-10-CM

## 2016-09-07 DIAGNOSIS — Z9861 Coronary angioplasty status: Secondary | ICD-10-CM

## 2016-09-07 LAB — BASIC METABOLIC PANEL
BUN: 51 mg/dL — ABNORMAL HIGH (ref 7–25)
CO2: 25 mmol/L (ref 20–31)
Calcium: 8.8 mg/dL (ref 8.6–10.4)
Chloride: 101 mmol/L (ref 98–110)
Creat: 1.63 mg/dL — ABNORMAL HIGH (ref 0.60–0.93)
Glucose, Bld: 251 mg/dL — ABNORMAL HIGH (ref 65–99)
Potassium: 5.4 mmol/L — ABNORMAL HIGH (ref 3.5–5.3)
Sodium: 136 mmol/L (ref 135–146)

## 2016-09-07 NOTE — Assessment & Plan Note (Signed)
a. 10/2015 PCI [facility] [place]): OM (2.5x30 Resolute DES), LCX (3.5x12 Resolute DES).

## 2016-09-07 NOTE — Assessment & Plan Note (Signed)
On oral agents 

## 2016-09-07 NOTE — Assessment & Plan Note (Signed)
K+ 5.4 on labs 09/02/16

## 2016-09-07 NOTE — Progress Notes (Signed)
09/07/2016 April Short   Apr 19, 1935  [id number]  Primary Physician Karis Juba, PA-C Primary Cardiologist: Dr Gwenlyn Found  HPI:  81 y/o female from [place]. She has a history of CAD and had CFX and OM stenting in [facility] Feb 2017. She moved her with her daughter to help care for her brother who had end stage stomach cancer. An echo done May 2017 showed severe LVD with an EF of 20-25%. She was placed on medical Rx including Entresto. Her brother did pass away and her daughter tells me the patient has been doing better, increased activity, increased appetite, and increased social interaction. She now says she has hard time keeping up with her mother when they go shopping. I suspect the improvement is a combination of resolving grief over her brothers death and medical Rx for CHF.   The pt wants to go to [place] for 3 months. She was sent here for clearance and to get 3 months Rx for medications to take with her. She denies any orthopnea, unusual dyspnea, or chest pain. She has not had edema. Recent labs did show a bump in her SCr to 1.5 and her K+ to 5.4. I saw her in the office 09/03/16 and had her stop her Aldactone and K+ supplement and decrease her Lasix to 20 mg daily. She wants to go to [place]. I had her come back today with a BMP hoping to see an improvement in her renal function and her K+. Unfortunately this was not the case. Her K+ is still 5.4 and her BUN/SCr is 51/1.63. The pt feels well, no unusual dyspnea or weakness.    Current Outpatient Prescriptions  Medication Sig Dispense Refill  . aspirin EC 81 MG tablet Take 81 mg by mouth daily.    . carvedilol (COREG) 6.25 MG tablet Take 1 tablet (6.25 mg total) by mouth 2 (two) times daily with a meal. 180 tablet 2  . ENTRESTO 24-26 MG TAKE 1 TABLET BY MOUTH TWICE A DAY. PT NEEDS OFFICE VISIT BEFORE ANY FURTHER REFILLS. 60 tablet 0  . ferrous sulfate 325 (65 FE) MG tablet Take 1 tablet (325 mg total) by mouth 2 (two) times daily  with a meal. 60 tablet 11  . furosemide (LASIX) 20 MG tablet Take 1 tablet (20 mg total) by mouth daily. 30 tablet 5  . glipiZIDE (GLUCOTROL XL) 10 MG 24 hr tablet Take 10 mg by mouth daily with breakfast.    . KLOR-CON M20 20 MEQ tablet TAKE 1 TABLET (20 MEQ TOTAL) BY MOUTH 2 (TWO) TIMES DAILY. 60 tablet 5  . omeprazole (PRILOSEC) 20 MG capsule Take 1 capsule (20 mg total) by mouth daily. 90 capsule 2  . polyethylene glycol (MIRALAX / GLYCOLAX) packet Take 17 g by mouth daily as needed.    . sitaGLIPtin (JANUVIA) 100 MG tablet Take 1 tablet (100 mg total) by mouth daily. Patient need to sch office visit before anymore refills 30 tablet 0  . ticagrelor (BRILINTA) 90 MG TABS tablet Take by mouth 2 (two) times daily.     No current facility-administered medications for this visit.     No Known Allergies  Social History   Social History  . Marital status: Married    Spouse name: N/A  . Number of children: 7  . Years of education: N/A   Occupational History  . Not on file.   Social History Main Topics  . Smoking status: Never Smoker  . Smokeless tobacco: Never Used  .  Alcohol use 0.0 oz/week     Comment: occasionally  . Drug use: No  . Sexual activity: Not on file   Other Topics Concern  . Not on file   Social History Narrative  . No narrative on file     Review of Systems: General: negative for chills, fever, night sweats or weight changes.  Cardiovascular: negative for chest pain, dyspnea on exertion, edema, orthopnea, palpitations, paroxysmal nocturnal dyspnea or shortness of breath Dermatological: negative for rash Respiratory: negative for cough or wheezing Urologic: negative for hematuria Abdominal: negative for nausea, vomiting, diarrhea, bright red blood per rectum, melena, or hematemesis Neurologic: negative for visual changes, syncope, or dizziness All other systems reviewed and are otherwise negative except as noted above.    Blood pressure (!) 110/57, pulse  83, height 5\' 3"  (1.6 m), weight 140 lb 12.8 oz (63.9 kg), SpO2 94 %.  General appearance: alert, cooperative and no distress Neck: no carotid bruit and no JVD Lungs: clear to auscultation bilaterally Heart: regular rate and rhythm Extremities: extremities normal, atraumatic, no cyanosis or edema Skin: Skin color, texture, turgor normal. No rashes or lesions Neurologic: Grossly normal   ASSESSMENT AND PLAN:   Acute renal insufficiency SCr 1.54 (new) on 09/02/16 labs, Aldactone and K+ supplement stopped and Lasix decreased  CAD S/P percutaneous coronary angioplasty a. 10/2015 PCI [facility] [place]): OM (2.5x30 Resolute DES), LCX (3.5x12 Resolute DES).  Chronic systolic CHF (congestive heart failure) ([place]) a. 01/2016 Echo: EF 20-25%, diff HK, septal-lateral dyssynchrony. Mild MR, mod to sev dil LA, nl RV, mod dil RA, mod TR, PASP 63mmHg. B. Chronically elevated BNP  Hyperkalemia K+ 5.4 on labs 09/02/16  Non-insulin dependent type 2 diabetes mellitus ([place]) On oral agents   PLAN  I discussed Entresto with the pharmacist. It would be best to giver her another few days off Aldactone and K+ and on lower dose Lasix before we consider her an Entresto failure secondary to hyperkalemia and renal insufficiency.  She is traveling to [place] next week and I have asked her to have a BMP drawn there and send the results to [place]. If her K+ is still high and her SCr hasn't improved I think we should stop the [facility], otherwise continue and follow up with Dr Gwenlyn Found in January. There has been talk of the pt going to [place] for three months, from a cardiac standpoint this would be OK once this issue has been resolved.   Kerin Ransom PA-C 09/07/2016 3:42 PM

## 2016-09-07 NOTE — Patient Instructions (Signed)
Medication Instructions: Kerin Ransom, PA-C, recommends that you continue on your current medications as directed. Please refer to the Current Medication list given to you today.  Labwork: Your physician recommends that you return for lab work NEXT WEEK.  Testing/Procedures: NONE ORDERED  Follow-up: Lurena Joiner recommends that you schedule a follow-up appointment in [place] with Dr Gwenlyn Found.  If you need a refill on your cardiac medications before your next appointment, please call your pharmacy.

## 2016-09-07 NOTE — Assessment & Plan Note (Signed)
SCr 1.54 (new) on 09/02/16 labs, Aldactone and K+ supplement stopped and Lasix decreased

## 2016-09-07 NOTE — Assessment & Plan Note (Signed)
a. 01/2016 Echo: EF 20-25%, diff HK, septal-lateral dyssynchrony. Mild MR, mod to sev dil LA, nl RV, mod dil RA, mod TR, PASP 37mmHg. B. Chronically elevated BNP

## 2016-09-14 DIAGNOSIS — Z79899 Other long term (current) drug therapy: Secondary | ICD-10-CM | POA: Diagnosis not present

## 2016-09-14 LAB — BASIC METABOLIC PANEL
BUN: 22 mg/dL (ref 7–25)
CO2: 27 mmol/L (ref 20–31)
Calcium: 9.1 mg/dL (ref 8.6–10.4)
Chloride: 102 mmol/L (ref 98–110)
Creat: 1.23 mg/dL — ABNORMAL HIGH (ref 0.60–0.93)
Glucose, Bld: 246 mg/dL — ABNORMAL HIGH (ref 65–99)
Potassium: 5.2 mmol/L (ref 3.5–5.3)
Sodium: 136 mmol/L (ref 135–146)

## 2016-09-17 ENCOUNTER — Encounter: Payer: Self-pay | Admitting: *Deleted

## 2016-10-01 ENCOUNTER — Ambulatory Visit (INDEPENDENT_AMBULATORY_CARE_PROVIDER_SITE_OTHER): Payer: Medicare Other | Admitting: Podiatry

## 2016-10-01 DIAGNOSIS — L608 Other nail disorders: Secondary | ICD-10-CM

## 2016-10-01 DIAGNOSIS — B351 Tinea unguium: Secondary | ICD-10-CM

## 2016-10-01 DIAGNOSIS — E0843 Diabetes mellitus due to underlying condition with diabetic autonomic (poly)neuropathy: Secondary | ICD-10-CM

## 2016-10-01 DIAGNOSIS — M79609 Pain in unspecified limb: Secondary | ICD-10-CM

## 2016-10-01 DIAGNOSIS — L603 Nail dystrophy: Secondary | ICD-10-CM

## 2016-10-06 NOTE — Progress Notes (Signed)
   SUBJECTIVE Patient with a history of diabetes mellitus presents to office today complaining of elongated, thickened nails. Pain while ambulating in shoes. Patient is unable to trim their own nails.   OBJECTIVE General Patient is awake, alert, and oriented x 3 and in no acute distress. Derm Skin is dry and supple bilateral. Negative open lesions or macerations. Remaining integument unremarkable. Nails are tender, long, thickened and dystrophic with subungual debris, consistent with onychomycosis, 1-5 bilateral. No signs of infection noted. Vasc  DP and PT pedal pulses palpable bilaterally. Temperature gradient within normal limits.  Neuro Epicritic and protective threshold sensation diminished bilaterally.  Musculoskeletal Exam No symptomatic pedal deformities noted bilateral. Muscular strength within normal limits.  ASSESSMENT 1. Diabetes Mellitus w/ peripheral neuropathy 2. Onychomycosis of nail due to dermatophyte bilateral 3. Pain in foot bilateral  PLAN OF CARE 1. Patient evaluated today. 2. Instructed to maintain good pedal hygiene and foot care. Stressed importance of controlling blood sugar.  3. Mechanical debridement of nails 1-5 bilaterally performed using a nail nipper. Filed with dremel without incident.  4. Return to clinic in 3 mos.     Saida Lonon M. Kayleanna Lorman, DPM [facility]  Dr. Pervis Macintyre M. Jaceyon Strole, DPM    [address]                                        [place], [place] [address]                Office [phone]  Fax [phone]       

## 2016-10-19 ENCOUNTER — Ambulatory Visit (INDEPENDENT_AMBULATORY_CARE_PROVIDER_SITE_OTHER): Payer: Medicare Other | Admitting: Cardiovascular Disease

## 2016-10-19 ENCOUNTER — Other Ambulatory Visit: Payer: Self-pay | Admitting: Physician Assistant

## 2016-10-19 ENCOUNTER — Encounter: Payer: Self-pay | Admitting: Cardiovascular Disease

## 2016-10-19 VITALS — BP 107/60 | HR 89 | Ht 63.0 in | Wt 145.2 lb

## 2016-10-19 DIAGNOSIS — N289 Disorder of kidney and ureter, unspecified: Secondary | ICD-10-CM

## 2016-10-19 DIAGNOSIS — I255 Ischemic cardiomyopathy: Secondary | ICD-10-CM | POA: Diagnosis not present

## 2016-10-19 DIAGNOSIS — E7800 Pure hypercholesterolemia, unspecified: Secondary | ICD-10-CM | POA: Diagnosis not present

## 2016-10-19 DIAGNOSIS — I2510 Atherosclerotic heart disease of native coronary artery without angina pectoris: Secondary | ICD-10-CM | POA: Diagnosis not present

## 2016-10-19 DIAGNOSIS — Z9861 Coronary angioplasty status: Secondary | ICD-10-CM

## 2016-10-19 DIAGNOSIS — E1149 Type 2 diabetes mellitus with other diabetic neurological complication: Secondary | ICD-10-CM

## 2016-10-19 MED ORDER — FUROSEMIDE 20 MG PO TABS: 20.0000 mg | ORAL_TABLET | Freq: Every day | ORAL | 5 refills | Status: DC

## 2016-10-19 MED ORDER — SACUBITRIL-VALSARTAN 24-26 MG PO TABS
ORAL_TABLET | ORAL | 1 refills | Status: DC
Start: 2016-10-19 — End: 2017-01-18

## 2016-10-19 MED ORDER — TICAGRELOR 90 MG PO TABS: 90.0000 mg | ORAL_TABLET | Freq: Two times a day (BID) | ORAL | 1 refills | Status: DC

## 2016-10-19 NOTE — Assessment & Plan Note (Signed)
History of acute on chronic renal insufficiency with serum creatinine had improved from 1.63-1.23 with reduction in her daily furosemide dose from 40-20 mg a day.

## 2016-10-19 NOTE — Patient Instructions (Addendum)
Medication Instructions: Your physician recommends that you continue on your current medications as directed. Please refer to the Current Medication list given to you today.  Labwork:  Your physician recommends that you return for lab work in: 3 weeks--BMET   Testing/Procedures: Your physician has requested that you have an echocardiogram. Echocardiography is a painless test that uses sound waves to create images of your heart. It provides your doctor with information about the size and shape of your heart and how well your heart's chambers and valves are working. This procedure takes approximately one hour. There are no restrictions for this procedure. ([address] [place])  Follow-Up: We request that you follow-up in: 1 month with Ignacia Bayley, NP and in 6 months with Dr Andria Rhein will receive a reminder letter in the mail two months in advance. If you don't receive a letter, please call our office to schedule the follow-up appointment.  If you need a refill on your cardiac medications before your next appointment, please call your pharmacy.

## 2016-10-19 NOTE — Assessment & Plan Note (Signed)
History of ischemic cardiomyopathy with EF 20-25% by 2-D echo performed 02/04/16. She does have 2 drug-eluting stents placed in circumflex and obtuse marginal branch in [place] 10/31/15. Her medications have been adjusted. She does note symptoms of heart failure at this time. She is on carvedilol, Entresto and a  low-dose diuretic.

## 2016-10-19 NOTE — Progress Notes (Signed)
10/19/2016 April Short   1934/10/07  [id number]  Primary Physician Karis Juba, PA-C Primary Cardiologist: Lorretta Harp MD Renae Gloss  HPI:  April Short is a delightful 82 year old widowed Latino female baby by her daughter Regino Schultze today.i last saw her in the office 02/25/16. She was referred by Karis Juba for cardiac evaluation to be established in our practice. She recently relocated from [place] to [place] to be closer to family. She does have a history of diabetes. She had 2 drug-eluting stents placed in her circumflex and obtuse marginal branch and [place] 10/31/15 with a 2.5 x 30 mm long resolute stent placement in obtuse marginal branch and a 3.5 mm x 12 mm long placed in the circumflex. She is on appropriate medicines for systolic heart failure. She recently lost a significant amount of weight secondary to diuresis. She isambulatory but really does not leave the house. She denies chest pain. She does have some dyspnea on exertion. She is on dual therapy with Brilenta. ince I saw her in the office proximally 6 months ago she's remained currently stable. She denies chest pain or shortness of breath. She is not ambulatory with a cane. She was transitioned to interest. There was a discussion regarding ICD implantation for primary prevention however at 82 years old and not sure that this is necessarily a wise thing to do. We will will recheck a 2-D echocardiogram.   Current Outpatient Prescriptions  Medication Sig Dispense Refill  . aspirin EC 81 MG tablet Take 81 mg by mouth daily.    . carvedilol (COREG) 6.25 MG tablet Take 1 tablet (6.25 mg total) by mouth 2 (two) times daily with a meal. 180 tablet 2  . ENTRESTO 24-26 MG TAKE 1 TABLET BY MOUTH TWICE A DAY. PT NEEDS OFFICE VISIT BEFORE ANY FURTHER REFILLS. 60 tablet 0  . furosemide (LASIX) 20 MG tablet Take 1 tablet (20 mg total) by mouth daily. 30 tablet 5  . glipiZIDE (GLUCOTROL XL) 10 MG 24 hr tablet  Take 10 mg by mouth daily with breakfast.    . omeprazole (PRILOSEC) 20 MG capsule Take 1 capsule (20 mg total) by mouth daily. 90 capsule 2  . polyethylene glycol (MIRALAX / GLYCOLAX) packet Take 17 g by mouth daily as needed.    . sitaGLIPtin (JANUVIA) 100 MG tablet Take 1 tablet (100 mg total) by mouth daily. Patient need to sch office visit before anymore refills 30 tablet 0  . ticagrelor (BRILINTA) 90 MG TABS tablet Take by mouth 2 (two) times daily.     No current facility-administered medications for this visit.     No Known Allergies  Social History   Social History  . Marital status: Married    Spouse name: N/A  . Number of children: 7  . Years of education: N/A   Occupational History  . Not on file.   Social History Main Topics  . Smoking status: Never Smoker  . Smokeless tobacco: Never Used  . Alcohol use 0.0 oz/week     Comment: occasionally  . Drug use: No  . Sexual activity: Not on file   Other Topics Concern  . Not on file   Social History Narrative  . No narrative on file     Review of Systems: General: negative for chills, fever, night sweats or weight changes.  Cardiovascular: negative for chest pain, dyspnea on exertion, edema, orthopnea, palpitations, paroxysmal nocturnal dyspnea or shortness of breath Dermatological: negative for  rash Respiratory: negative for cough or wheezing Urologic: negative for hematuria Abdominal: negative for nausea, vomiting, diarrhea, bright red blood per rectum, melena, or hematemesis Neurologic: negative for visual changes, syncope, or dizziness All other systems reviewed and are otherwise negative except as noted above.    Blood pressure 107/60, pulse 89, height 5\' 3"  (1.6 m), weight 145 lb 3.2 oz (65.9 kg), SpO2 95 %.  General appearance: alert and no distress Neck: no adenopathy, no carotid bruit, no JVD, supple, symmetrical, trachea midline and thyroid not enlarged, symmetric, no tenderness/mass/nodules Lungs:  clear to auscultation bilaterally Heart: regular rate and rhythm, S1, S2 normal, no murmur, click, rub or gallop Extremities: extremities normal, atraumatic, no cyanosis or edema  EKG not performed today  ASSESSMENT AND PLAN:   Ischemic cardiomyopathy History of ischemic cardiomyopathy with EF 20-25% by 2-D echo performed 02/04/16. She does have 2 drug-eluting stents placed in circumflex and obtuse marginal branch in [place] 10/31/15. Her medications have been adjusted. She does note symptoms of heart failure at this time. She is on carvedilol, Entresto and a  low-dose diuretic.  HLD (hyperlipidemia) History of hyperlipidemia not on statin therapy with recent lipid profile performed 01/21/16 showed cholesterol 135, LDL of 81 and HDL 39  CAD S/P percutaneous coronary angioplasty Status post coronary intervention with 2 drug-eluting stents placed in her circumflex and obtuse marginal branch in [place] 10/31/15. She remains on aspirin and Brilenta.  Acute renal insufficiency History of acute on chronic renal insufficiency with serum creatinine had improved from 1.63-1.23 with reduction in her daily furosemide dose from 40-20 mg a day.      Lorretta Harp MD FACP,FACC,FAHA, [facility] 10/19/2016 1:45 PM

## 2016-10-19 NOTE — Addendum Note (Signed)
Addended by: Therisa Doyne on: 10/19/2016 01:57 PM   Modules accepted: Orders

## 2016-10-19 NOTE — Assessment & Plan Note (Signed)
Status post coronary intervention with 2 drug-eluting stents placed in her circumflex and obtuse marginal branch in [place] 10/31/15. She remains on aspirin and Brilenta.

## 2016-10-19 NOTE — Assessment & Plan Note (Signed)
History of hyperlipidemia not on statin therapy with recent lipid profile performed 01/21/16 showed cholesterol 135, LDL of 81 and HDL 39

## 2016-10-20 NOTE — Telephone Encounter (Signed)
Rx filled per protocol  

## 2016-10-21 ENCOUNTER — Encounter: Payer: Self-pay | Admitting: Physician Assistant

## 2016-10-21 ENCOUNTER — Ambulatory Visit (INDEPENDENT_AMBULATORY_CARE_PROVIDER_SITE_OTHER): Payer: Medicare Other | Admitting: Physician Assistant

## 2016-10-21 VITALS — BP 124/62 | HR 87 | Temp 97.9°F | Resp 16 | Wt 144.2 lb

## 2016-10-21 DIAGNOSIS — Z9861 Coronary angioplasty status: Secondary | ICD-10-CM | POA: Diagnosis not present

## 2016-10-21 DIAGNOSIS — I5022 Chronic systolic (congestive) heart failure: Secondary | ICD-10-CM | POA: Diagnosis not present

## 2016-10-21 DIAGNOSIS — E1149 Type 2 diabetes mellitus with other diabetic neurological complication: Secondary | ICD-10-CM

## 2016-10-21 DIAGNOSIS — I110 Hypertensive heart disease with heart failure: Secondary | ICD-10-CM | POA: Diagnosis not present

## 2016-10-21 DIAGNOSIS — I2510 Atherosclerotic heart disease of native coronary artery without angina pectoris: Secondary | ICD-10-CM | POA: Diagnosis not present

## 2016-10-21 DIAGNOSIS — I255 Ischemic cardiomyopathy: Secondary | ICD-10-CM | POA: Diagnosis not present

## 2016-10-21 DIAGNOSIS — E119 Type 2 diabetes mellitus without complications: Secondary | ICD-10-CM | POA: Diagnosis not present

## 2016-10-21 DIAGNOSIS — Z23 Encounter for immunization: Secondary | ICD-10-CM

## 2016-10-21 MED ORDER — SITAGLIPTIN PHOSPHATE 100 MG PO TABS: 100.0000 mg | ORAL_TABLET | Freq: Every day | ORAL | 1 refills | Status: DC

## 2016-10-21 NOTE — Progress Notes (Signed)
Patient ID: April Short MRN: [id number], DOB: March 09, 1935, 82 y.o. Date of Encounter: @DATE @  Chief Complaint:  Chief Complaint  Patient presents with  . Diabetes    f/u   HPI: 82 y.o. year old female  presents with her daughter for OV today.    01/14/2016: She is here as a new patient to establish care with our practice. I do not have records yet. The following history is provided by her daughter today. Pt does not speak much English. Daughter translates/interprets.   Daughter states the patient just recently moved here from [place]--2 months ago. She was living with her other daughter in [place]. She reports that she recently got 2 stents to her heart February 2017 and at that time was told that she also has congestive heart failure. Says that prior to that, patient was independent so the other sister could care for her. She states that now, since these cardiac issues, she is not as independent --so it was decided that she should come live with this daughter. This daughter states that her own husband passed away several months ago and she does not work so it made sense for patient to move with her.  She does have the card given to them from the stent manufacturers. She received a stent to the OM and a stent to the circumflex on 10/31/15. Daughter states that "her heart is pumping less than 50% "  She says the patient had colon cancer in 2001 and had surgery. Says that she had cervical cancer in 2004 and had a complete hysterectomy. Says that she has had diabetes for 23 years. ------ notes that metformin was stopped secondary to GI adverse effects in the past  Says that her mom made it through all of these other medical problems and remained independent until these issues with her heart recently.  States that patient developed swelling and shortness of breath and went to her primary care physician in [place] and she then was set up with Cardiologist and had a nuclear test,  then cath with stents.  Daughter also notes that patient had been on lisinopril but then was switched to [facility].  They report that when living in [place], she saw her Primary Care Physician, Cardiology, Podiatry. Had no other specialists. Says that she saw a podiatrist for diabetic foot care-- nails clipped  etc. secondary to her diabetes. Had no other specific problems with her feet.  Also reports the patient has significant diabetic neuropathy and that it is mostly affects her hands. Says that she "can do nothing with her hands ".   AT THAT OV, CHECKED LABS INCLUDING BNP.  BNP came back elevated at 2185.  BUN 22. Creatinine 1.03.  At that time:  Increased Lasix from 40mg  QD to 80mg  BID (morning and noon)  Added new--KDur 4meq BID (morning and noon)  --had been on no potassium supplement prior to that---on Spironolactone Told to f/u Monday (01/19/2016)   01/19/2016: Today reports that she is feeling so much better. Says that the swelling has decreased a lot. Says that she had the best weekend she has had since she came from [place]. Says that she was so much more alert, has been sleeping better, eating better. Daughter says that she weighs patient daily and she has decreased 15 pounds. Says that she is walking more. Says that her cough has almost disappeared. Says that before last week's visit-- when she would walk from the chair to the  refrigerator, she was short of breath and breathing hard. Says now she can walk that distance with no problems. Daughter states the patient sleeps with a cushion and then a pillow on top of that and is propped up that way. Says that she has continued this. However is not coughing at night now. Daughter has noticed that patient's blood pressure is lower now but pt says she is not feeling lightheaded and is not having any symptoms with this. Daughter shows me readings that she checks on patient every day. Every day she checks-- weight, blood sugar, blood  pressure, and oxygen saturation.  AT THAT TIME: Told to take Lasix 80mg  QAM and KDur 51meq QAM. F/U 1 week.  01/26/2016:  She had OV with Cardiology--Dr. Berry--01/21/2016.  I reviewed his OV note.  He continued meds same with no change.  Felt that she was at dry weight. Weight that day was 136.  Lungs were clear. No JVD.  Today daughter says that Dr. Gwenlyn Found explained to them that she will always have some swelling. And that he told her to continue daily weights and to call if weight increases.  She is scheduled for Echo on May 17th.  Today they say she still feeling so much better. Says they "went shopping yesterday and she was walking instead of using wheelchair. Sleeping better now too". Daughter says that yesterday patient said "[name] is back."  No complaint/concern today. Does want referral to Podiatrist--to clip her nails"--she saw Podiatrist for this in past in [place].  AT OV 01/26/2016----ORDERED : REFERRAL TO PODIATRY---------she saw podiatry in [facility] "to clip her nails" --wants f/u with podiatry for this. BMET HEME OCCULT X3 JANUVIA 100MG  QD  02/02/2016: Pt and daughter report she had wonderful Mother's Day. Was with multiple of her children (including her son, April Short, who recently established care with me but unfortunately has gastric cancer and recently started hospice.  Turned in [facility] cards today. At this visit, I gave her contact info for her to schedule Diabetic eye exam.     09/03/2016: Patient's daughter accompanies her again today and translates.  Reports that reason pt went a whil between OVs was that patinet's son (who was pt of mine also) passed away from his cnacer. Pt was "very emotional". Pt went and stayed with other children in [place] for 2 months.  Says that now she is better--"eating, drinking, sleeping good now." Says that pt turns 82 on Feb 3rd. Is wnating to go to "her country" ([place] and plan a big party there.  Wants to go  there to be there for Christmas, New Years, then plan the party, have the party, and return around Feb 15th.  Says pt knows it will be the last time to go there--wants to "say good bye".  Says she has appt with Cardiology tomorrow--wanted to check with me and Cardiology to see if thnk she go to [place].  She checks BS every morning--- Usually gets 110, 120, 130--sometimes 80, 90, 100--"depends on what she ate the night before---never < 80 " Daughter also checks her BP routinely---gets low readings but she asks pt if she is lightheaded and she never is lightheaded.  Also checks her oxygen level and "that is good too".   10/21/2016: Patient's daughter accompanies her again today and translates.  Her daughter states that she came back in for follow-up office visit today because now they are thinking of her going to [facility] for her birthday. Daughter wants to check with everyone  to make sure that they feel that she is okay for her to go. Daughter states that patient was seen a couple of times at the cardiology office and medications adjusted and then she had follow-up visit with Dr. Gwenlyn Found just this week. Says that Dr. Gwenlyn Found feels that patient can go to [facility] and that would be fine. Daughter states the patient ended up getting scared to go to [place]. Says that patient has 2 sisters that live in [place] and she has not seen them in over 10 years and they are thinking of her going there and staying for one month. Daughter says that patient still gets a little depressed about her son who lived in this area who recently passed away from cancer and gets upset if they drive by his house etc. Thinks it will help keep her mom  in good spirits to have a change of scenery,  go see family, etc-- in [place]. Continues to check her blood sugar every morning and is still getting readings between 90 to 130s.   Past Medical History:  Diagnosis Date  . Anemia   . Arthritis   . CAD (coronary artery disease)    a.  10/2015 PCI [facility] [place]): OM (2.5x30 Resolute DES), LCX (3.5x12 Resolute DES).  . Cervical cancer ([place]) 2005  . Chronic systolic CHF (congestive heart failure) ([place])    a. 01/2016 Echo: EF 20-25%, diff HK, septal-lateral dyssynchrony. Mild MR, mod to sev dil LA, nl RV, mod dil RA, mod TR, PASP 69mmHg.  . Colon cancer ([place]) 2001  . DM (diabetes mellitus) ([place])   . HLD (hyperlipidemia)   . Hypertensive heart disease   . Ischemic cardiomyopathy    a. 01/2016 Echo: EF 20-25%.  Marland Kitchen LBBB (left bundle branch block)      Home Meds:    Medication List       .               aspirin EC 81 MG tablet  Take 81 mg by mouth daily.     BRILINTA 90 MG Tabs tablet  Generic drug:  ticagrelor  Take by mouth 2 (two) times daily.     carvedilol 6.25 MG tablet  Commonly known as:  COREG  Take 6.25 mg by mouth 2 (two) times daily with a meal.     DIABETIC TUSSIN MAX ST PO  Take by mouth as needed.     ENTRESTO 24-26 MG  Generic drug:  sacubitril-valsartan  Take 1 tablet by mouth 2 (two) times daily.     furosemide 40 MG tablet  Commonly known as:  LASIX  Take 40 mg by mouth daily.     glipiZIDE 10 MG 24 hr tablet  Commonly known as:  GLUCOTROL XL  Take 10 mg by mouth daily with breakfast.     polyethylene glycol packet  Commonly known as:  MIRALAX / GLYCOLAX  Take 17 g by mouth daily as needed.     spironolactone 25 MG tablet  Commonly known as:  ALDACTONE  Take 25 mg by mouth daily.           Allergies: No Known Allergies  Social History   Social History  . Marital status: Married    Spouse name: N/A  . Number of children: 7  . Years of education: N/A   Occupational History  . Not on file.   Social History Main Topics  . Smoking status: Never Smoker  . Smokeless tobacco: Never Used  .  Alcohol use 0.0 oz/week     Comment: occasionally  . Drug use: No  . Sexual activity: Not on file   Other Topics Concern  . Not on file   Social History  Narrative  . No narrative on file    Family History  Problem Relation Age of Onset  . Colon cancer Mother   . Stomach cancer Son   . Diabetes Sister      Review of Systems:  See HPI for pertinent ROS. All other ROS negative.    Physical Exam: Blood pressure 124/62, pulse 87, temperature 97.9 F (36.6 C), temperature source Oral, resp. rate 16, weight 144 lb 3.2 oz (65.4 kg), SpO2 97 %., Body mass index is 25.54 kg/m. General: WNWD Hispanic Female. Does not speak [place]. She is smiling today!! (At her first OV here, had very flat affect) Appears in no acute distress. Neck: Supple. No thyromegaly. No lymphadenopathy. No JVD. No carotid bruits. Lungs: Clear. Heart: RRR with S1 S2. No murmurs, rubs, or gallops. Abdomen: Soft, non-tender, non-distended with normoactive bowel sounds. No hepatomegaly. No rebound/guarding. No obvious abdominal masses. Musculoskeletal:  Strength and tone normal for age. Extremities/Skin: Diabetic Foot Exam: Inspection normal. Very good skin care. No  problem areas at all.  No LE edema.  Neuro: Alert and oriented X 3. Moves all extremities spontaneously. Gait is normal. CNII-XII grossly in tact. Psych:  Responds to questions appropriately with a normal affect.     ASSESSMENT AND PLAN:  82 y.o. year old female with  1. Encounter to establish care We currently have no records. At initial Ov 01/14/2016-- she signed a medical release form for her prior primary care provider as well as her cardiologist. I have not received any records yet--as of 02/02/2016---- 10/21/2016---Never received records  2. DM (diabetes mellitus), type 2 with neurological complications ([place]) Note the daughter states that in the past metformin was stopped secondary to GI adverse effects. ACE inhibitor was changed to ARB in [place] She is on no statin. ---FLP performed 01/2016 shows LDL 81. Tri-76, HDL-39 -----------------01/14/2016---checked A1C---came back at 9.2  --------------------------------------------------- ----------------Once we get her CHF stable, then I will f/u this with pt at an OV------------------------- At OV 01/26/2016----Added Januvia 100mg  QD.  At [place] 5/8/017--Ordered referal to podiatry for foot care At [place] 02/02/2016--Gave contact info for Diabetic Eye Exam 09/02/2016---Check A1C and MicroAlbumin and kidney function 10/21/2016--- reviewed that on 09/02/16 A1c was 6.3. Excellent. Cont current diabetic meds.  Lipids --Good. 01/21/2016---Trig--76. HDL--39.  LDL--81  3. Coronary artery disease involving native coronary artery of native heart without angina pectoris This is currently stable. This will be managed by cardiology. -01/14/2016--Ordered referral to Cardiology She had OV with Cardiology--Dr. Berry--01/21/2016---he made no changes to meds. Ordered Echo--scheduled for May 17th. 09/02/2016---She has appt to see Cardiology tomorrow 10/21/2016---managed by Cardiology--Dr. Gwenlyn Found  4. Chronic systolic heart failure ([place]) At her initial OV--01/14/2016--was on Lasix 40mg  QD (was on no K--was on spironolactone) At that time, when reviewed BNP---increased to 80mg  BID (added K34meq BID [in addition to spironolactone] )  Weight 01/14/2016-----155 Weight 01/19/2016-------140 Weight 01/26/2016-------133 Weight 09/02/2016----134 At OV 01/19/2016---changed dose to 80mg  QD (QAM only) and to take KDur 63meq QD with this.  At OV 01/26/2016---continued Lasix 80mg  QAM and KDur 40meq Q AM.  She had appointment with Cardiology--Dr. Berry---01/21/2016----He felt that she was at her Dry Weight at that OV--weight 136 at that OV. He made no changes to meds. Ordered Echo, which is scheduled for May 17th.  09/02/2016---At dry weight.  Lungs clear. No LE edema. No med changes at this time. Will f/u lab results---CMET--and has appt with Cardiology tomorrow.  10/21/2016---managed by Cardiology--Dr. Gwenlyn Found  5. Neuropathy ([place]) She is on no medication for neuropathy. Will  follow-up with this in the future.  6. Anemia Lab 01/14/16 showed H/H    9.1/30.6. MCV and MCH low.  Anemia Panel added.  Iron low at 25. %sat low at 6.  B12--normal.  Folate--normal -[facility] cards turned in 02/02/2016-----cmae back positive.  Subsequently saw GI 09/02/2016-----Recheck CBC now to monitor.     ROV 3 months, sooner if needed.   Signed, [address] [place], [place], [facility] 10/21/2016 10:43 AM

## 2016-10-21 NOTE — Addendum Note (Signed)
Addended by: Vonna Kotyk A on: 10/21/2016 05:01 PM   Modules accepted: Orders

## 2016-11-10 ENCOUNTER — Ambulatory Visit: Payer: Medicare Other | Admitting: Cardiology

## 2016-11-18 ENCOUNTER — Ambulatory Visit (HOSPITAL_COMMUNITY): Payer: Medicare Other | Attending: Cardiology

## 2016-11-22 ENCOUNTER — Other Ambulatory Visit: Payer: Self-pay

## 2016-11-22 DIAGNOSIS — E1149 Type 2 diabetes mellitus with other diabetic neurological complication: Secondary | ICD-10-CM

## 2016-11-22 MED ORDER — SITAGLIPTIN PHOSPHATE 100 MG PO TABS: 100.0000 mg | ORAL_TABLET | Freq: Every day | ORAL | 0 refills | Status: DC

## 2016-11-22 NOTE — Telephone Encounter (Signed)
rx filled per protocol  

## 2016-11-23 ENCOUNTER — Ambulatory Visit: Payer: Medicare Other | Admitting: Nurse Practitioner

## 2016-11-23 ENCOUNTER — Other Ambulatory Visit: Payer: Self-pay | Admitting: Physician Assistant

## 2016-11-23 ENCOUNTER — Encounter: Payer: Self-pay | Admitting: Cardiovascular Disease

## 2016-11-23 NOTE — Telephone Encounter (Signed)
Refill appropriate 

## 2016-11-24 DIAGNOSIS — M810 Age-related osteoporosis without current pathological fracture: Secondary | ICD-10-CM | POA: Diagnosis not present

## 2016-11-24 DIAGNOSIS — E119 Type 2 diabetes mellitus without complications: Secondary | ICD-10-CM | POA: Diagnosis not present

## 2016-11-24 DIAGNOSIS — Z0000 Encounter for general adult medical examination without abnormal findings: Secondary | ICD-10-CM | POA: Diagnosis not present

## 2016-11-26 DIAGNOSIS — Z0000 Encounter for general adult medical examination without abnormal findings: Secondary | ICD-10-CM | POA: Diagnosis not present

## 2016-11-26 DIAGNOSIS — E119 Type 2 diabetes mellitus without complications: Secondary | ICD-10-CM | POA: Diagnosis not present

## 2016-12-01 DIAGNOSIS — I429 Cardiomyopathy, unspecified: Secondary | ICD-10-CM | POA: Diagnosis not present

## 2016-12-01 DIAGNOSIS — E119 Type 2 diabetes mellitus without complications: Secondary | ICD-10-CM | POA: Diagnosis not present

## 2016-12-01 DIAGNOSIS — I2510 Atherosclerotic heart disease of native coronary artery without angina pectoris: Secondary | ICD-10-CM | POA: Diagnosis not present

## 2016-12-01 DIAGNOSIS — Z9862 Peripheral vascular angioplasty status: Secondary | ICD-10-CM | POA: Diagnosis not present

## 2016-12-01 DIAGNOSIS — I10 Essential (primary) hypertension: Secondary | ICD-10-CM | POA: Diagnosis not present

## 2016-12-01 DIAGNOSIS — E1169 Type 2 diabetes mellitus with other specified complication: Secondary | ICD-10-CM | POA: Diagnosis not present

## 2016-12-01 DIAGNOSIS — I509 Heart failure, unspecified: Secondary | ICD-10-CM | POA: Diagnosis not present

## 2016-12-01 DIAGNOSIS — R0602 Shortness of breath: Secondary | ICD-10-CM | POA: Diagnosis not present

## 2016-12-01 DIAGNOSIS — E782 Mixed hyperlipidemia: Secondary | ICD-10-CM | POA: Diagnosis not present

## 2016-12-01 DIAGNOSIS — E785 Hyperlipidemia, unspecified: Secondary | ICD-10-CM | POA: Diagnosis not present

## 2016-12-15 DIAGNOSIS — H6520 Chronic serous otitis media, unspecified ear: Secondary | ICD-10-CM | POA: Diagnosis not present

## 2016-12-15 DIAGNOSIS — H6120 Impacted cerumen, unspecified ear: Secondary | ICD-10-CM | POA: Diagnosis not present

## 2016-12-15 DIAGNOSIS — R109 Unspecified abdominal pain: Secondary | ICD-10-CM | POA: Diagnosis not present

## 2016-12-15 DIAGNOSIS — Z6825 Body mass index (BMI) 25.0-25.9, adult: Secondary | ICD-10-CM | POA: Diagnosis not present

## 2016-12-15 DIAGNOSIS — E119 Type 2 diabetes mellitus without complications: Secondary | ICD-10-CM | POA: Diagnosis not present

## 2016-12-28 DIAGNOSIS — I2510 Atherosclerotic heart disease of native coronary artery without angina pectoris: Secondary | ICD-10-CM | POA: Diagnosis not present

## 2016-12-28 DIAGNOSIS — R0602 Shortness of breath: Secondary | ICD-10-CM | POA: Diagnosis not present

## 2016-12-28 DIAGNOSIS — Z9862 Peripheral vascular angioplasty status: Secondary | ICD-10-CM | POA: Diagnosis not present

## 2016-12-28 DIAGNOSIS — I509 Heart failure, unspecified: Secondary | ICD-10-CM | POA: Diagnosis not present

## 2016-12-28 DIAGNOSIS — R079 Chest pain, unspecified: Secondary | ICD-10-CM | POA: Diagnosis not present

## 2016-12-29 DIAGNOSIS — E119 Type 2 diabetes mellitus without complications: Secondary | ICD-10-CM | POA: Diagnosis not present

## 2017-01-03 ENCOUNTER — Ambulatory Visit: Payer: Medicare Other | Admitting: Podiatry

## 2017-01-11 ENCOUNTER — Telehealth: Payer: Self-pay | Admitting: Cardiovascular Disease

## 2017-01-11 NOTE — Telephone Encounter (Signed)
Error

## 2017-01-17 DIAGNOSIS — E1169 Type 2 diabetes mellitus with other specified complication: Secondary | ICD-10-CM | POA: Diagnosis not present

## 2017-01-17 DIAGNOSIS — R9439 Abnormal result of other cardiovascular function study: Secondary | ICD-10-CM | POA: Diagnosis not present

## 2017-01-17 DIAGNOSIS — I10 Essential (primary) hypertension: Secondary | ICD-10-CM | POA: Diagnosis not present

## 2017-01-17 DIAGNOSIS — R079 Chest pain, unspecified: Secondary | ICD-10-CM | POA: Diagnosis not present

## 2017-01-17 DIAGNOSIS — Z01818 Encounter for other preprocedural examination: Secondary | ICD-10-CM | POA: Diagnosis not present

## 2017-01-17 DIAGNOSIS — Z9862 Peripheral vascular angioplasty status: Secondary | ICD-10-CM | POA: Diagnosis not present

## 2017-01-17 DIAGNOSIS — I429 Cardiomyopathy, unspecified: Secondary | ICD-10-CM | POA: Diagnosis not present

## 2017-01-18 ENCOUNTER — Other Ambulatory Visit: Payer: Self-pay

## 2017-01-18 MED ORDER — SACUBITRIL-VALSARTAN 24-26 MG PO TABS
ORAL_TABLET | ORAL | 3 refills | Status: DC
Start: 2017-01-18 — End: 2020-06-27

## 2017-02-01 ENCOUNTER — Other Ambulatory Visit: Payer: Self-pay | Admitting: Cardiology

## 2017-02-03 ENCOUNTER — Ambulatory Visit: Payer: Medicare Other | Admitting: Physician Assistant

## 2017-02-07 ENCOUNTER — Ambulatory Visit: Payer: Medicare Other | Admitting: Podiatry

## 2017-02-11 DIAGNOSIS — E785 Hyperlipidemia, unspecified: Secondary | ICD-10-CM | POA: Diagnosis not present

## 2017-02-11 DIAGNOSIS — Z7982 Long term (current) use of aspirin: Secondary | ICD-10-CM | POA: Diagnosis not present

## 2017-02-11 DIAGNOSIS — I10 Essential (primary) hypertension: Secondary | ICD-10-CM | POA: Diagnosis not present

## 2017-02-11 DIAGNOSIS — Z7902 Long term (current) use of antithrombotics/antiplatelets: Secondary | ICD-10-CM | POA: Diagnosis not present

## 2017-02-11 DIAGNOSIS — Z7984 Long term (current) use of oral hypoglycemic drugs: Secondary | ICD-10-CM | POA: Diagnosis not present

## 2017-02-11 DIAGNOSIS — R9439 Abnormal result of other cardiovascular function study: Secondary | ICD-10-CM | POA: Diagnosis not present

## 2017-02-11 DIAGNOSIS — Z955 Presence of coronary angioplasty implant and graft: Secondary | ICD-10-CM | POA: Diagnosis not present

## 2017-02-11 DIAGNOSIS — E119 Type 2 diabetes mellitus without complications: Secondary | ICD-10-CM | POA: Diagnosis not present

## 2017-02-11 DIAGNOSIS — I2510 Atherosclerotic heart disease of native coronary artery without angina pectoris: Secondary | ICD-10-CM | POA: Diagnosis not present

## 2017-03-14 DIAGNOSIS — J329 Chronic sinusitis, unspecified: Secondary | ICD-10-CM | POA: Diagnosis not present

## 2017-03-14 DIAGNOSIS — R05 Cough: Secondary | ICD-10-CM | POA: Diagnosis not present

## 2017-03-28 ENCOUNTER — Other Ambulatory Visit: Payer: Self-pay | Admitting: Physician Assistant

## 2017-03-28 DIAGNOSIS — E1149 Type 2 diabetes mellitus with other diabetic neurological complication: Secondary | ICD-10-CM

## 2017-04-27 DIAGNOSIS — I447 Left bundle-branch block, unspecified: Secondary | ICD-10-CM | POA: Diagnosis not present

## 2017-04-27 DIAGNOSIS — I5042 Chronic combined systolic (congestive) and diastolic (congestive) heart failure: Secondary | ICD-10-CM | POA: Diagnosis not present

## 2017-04-27 DIAGNOSIS — I2510 Atherosclerotic heart disease of native coronary artery without angina pectoris: Secondary | ICD-10-CM | POA: Diagnosis not present

## 2017-04-27 DIAGNOSIS — I420 Dilated cardiomyopathy: Secondary | ICD-10-CM | POA: Diagnosis not present

## 2017-05-16 DIAGNOSIS — Z23 Encounter for immunization: Secondary | ICD-10-CM | POA: Diagnosis not present

## 2017-05-16 DIAGNOSIS — E119 Type 2 diabetes mellitus without complications: Secondary | ICD-10-CM | POA: Diagnosis not present

## 2018-01-21 ENCOUNTER — Other Ambulatory Visit: Payer: Self-pay | Admitting: Cardiovascular Disease

## 2018-01-23 NOTE — Telephone Encounter (Signed)
REFILL 

## 2018-02-23 ENCOUNTER — Other Ambulatory Visit: Payer: Self-pay | Admitting: Cardiovascular Disease

## 2018-02-23 NOTE — Telephone Encounter (Signed)
Rx sent to pharmacy   

## 2018-04-11 ENCOUNTER — Other Ambulatory Visit: Payer: Self-pay | Admitting: Cardiovascular Disease

## 2018-06-06 ENCOUNTER — Other Ambulatory Visit: Payer: Self-pay | Admitting: Cardiovascular Disease

## 2018-09-03 ENCOUNTER — Other Ambulatory Visit: Payer: Self-pay | Admitting: Cardiovascular Disease

## 2018-09-04 NOTE — Telephone Encounter (Signed)
Rx request sent to pharmacy.  

## 2018-09-11 ENCOUNTER — Other Ambulatory Visit: Payer: Self-pay | Admitting: Cardiovascular Disease

## 2020-06-05 ENCOUNTER — Encounter: Payer: Self-pay | Admitting: Podiatry

## 2020-06-05 ENCOUNTER — Ambulatory Visit (INDEPENDENT_AMBULATORY_CARE_PROVIDER_SITE_OTHER): Payer: Medicare Other | Admitting: Podiatry

## 2020-06-05 ENCOUNTER — Other Ambulatory Visit: Payer: Self-pay

## 2020-06-05 DIAGNOSIS — M79675 Pain in left toe(s): Secondary | ICD-10-CM | POA: Diagnosis not present

## 2020-06-05 DIAGNOSIS — M79674 Pain in right toe(s): Secondary | ICD-10-CM | POA: Diagnosis not present

## 2020-06-05 DIAGNOSIS — B351 Tinea unguium: Secondary | ICD-10-CM

## 2020-06-05 NOTE — Progress Notes (Signed)
This patient returns to my office for at risk foot care.  This patient requires this care by a professional since this patient will be at risk due to having diabetes and renal insufficiency and neuropathy. This patient is unable to cut nails himself since the patient cannot reach his nails.These nails are painful walking and wearing shoes.  This patient presents for at risk foot care today.  General Appearance  Alert, conversant and in no acute stress.  Vascular  Dorsalis pedis are palpable  bilaterally. Posterior tibial pulses are absent  B/L. Capillary return is within normal limits  bilaterally. Temperature is within normal limits  bilaterally.  Neurologic  Senn-Weinstein monofilament wire test within normal limits  bilaterally. Muscle power within normal limits bilaterally.  Nails Thick disfigured discolored nails with subungual debris  from hallux to fifth toes bilaterally. No evidence of bacterial infection or drainage bilaterally.  Orthopedic  No limitations of motion  feet .  No crepitus or effusions noted.  No bony pathology or digital deformities noted. HAV  B/L.  Skin  normotropic skin with no porokeratosis noted bilaterally.  No signs of infections or ulcers noted.     Onychomycosis  Pain in right toes  Pain in left toes  Consent was obtained for treatment procedures.   Mechanical debridement of nails 1-5  bilaterally performed with a nail nipper.  Filed with dremel without incident.    Return office visit  3 months.                   Told patient to return for periodic foot care and evaluation due to potential at risk complications.   Marlane Hirschmann DPM  

## 2020-06-27 ENCOUNTER — Encounter: Payer: Self-pay | Admitting: Cardiovascular Disease

## 2020-06-27 ENCOUNTER — Other Ambulatory Visit: Payer: Self-pay

## 2020-06-27 ENCOUNTER — Telehealth: Payer: Self-pay

## 2020-06-27 ENCOUNTER — Ambulatory Visit (INDEPENDENT_AMBULATORY_CARE_PROVIDER_SITE_OTHER): Payer: Medicare Other | Admitting: Cardiovascular Disease

## 2020-06-27 VITALS — BP 92/54 | HR 61 | Ht 63.0 in | Wt 126.7 lb

## 2020-06-27 DIAGNOSIS — I255 Ischemic cardiomyopathy: Secondary | ICD-10-CM | POA: Diagnosis not present

## 2020-06-27 DIAGNOSIS — I2510 Atherosclerotic heart disease of native coronary artery without angina pectoris: Secondary | ICD-10-CM | POA: Diagnosis not present

## 2020-06-27 DIAGNOSIS — Z9861 Coronary angioplasty status: Secondary | ICD-10-CM

## 2020-06-27 DIAGNOSIS — E7800 Pure hypercholesterolemia, unspecified: Secondary | ICD-10-CM | POA: Diagnosis not present

## 2020-06-27 MED ORDER — SACUBITRIL-VALSARTAN 49-51 MG PO TABS: 1.0000 | ORAL_TABLET | Freq: Two times a day (BID) | ORAL | 12 refills | Status: DC

## 2020-06-27 MED ORDER — TICAGRELOR 60 MG PO TABS: 60.0000 mg | ORAL_TABLET | Freq: Two times a day (BID) | ORAL | 12 refills | Status: DC

## 2020-06-27 NOTE — Assessment & Plan Note (Signed)
History of hyperlipidemia on statin therapy.  We will recheck a lipid liver profile 

## 2020-06-27 NOTE — Patient Instructions (Signed)
  Lab Work:  Your physician recommends that you HAVE LAB WORK TODAY  If you have labs (blood work) drawn today and your tests are completely normal, you will receive your results only by: Marland Kitchen MyChart Message (if you have MyChart) OR . A paper copy in the mail If you have any lab test that is abnormal or we need to change your treatment, we will call you to review the results.   Follow-Up: At [facility], you and your health needs are our priority.  As part of our continuing mission to provide you with exceptional heart care, we have created designated Provider Care Teams.  These Care Teams include your primary Cardiologist (physician) and Advanced Practice Providers (APPs -  Physician Assistants and Nurse Practitioners) who all work together to provide you with the care you need, when you need it.  We recommend signing up for the patient portal called "MyChart".  Sign up information is provided on this After Visit Summary.  MyChart is used to connect with patients for Virtual Visits (Telemedicine).  Patients are able to view lab/test results, encounter notes, upcoming appointments, etc.  Non-urgent messages can be sent to your provider as well.   To learn more about what you can do with MyChart, go to [email or web address].    Your next appointment:   6 month(s)  The format for your next appointment:   In Person  Provider:   You will see one of the following Advanced Practice Providers on your designated Care Team:    Kerin Ransom, PA-C  Coraz[place], [place]  Coletta Memos, [place]  Then, Quay Burow MD will plan to see you again in 12 month(s).    Other Instructions  REFERRAL TO EP AT [place] ICD-TO ESTABLISH  CALL [place] TO GET [place]

## 2020-06-27 NOTE — Assessment & Plan Note (Signed)
History of CAD status post 2 drug-eluting stents placed in [place] 10/31/2015 with a 2.5 x 30 mm long resolute stent placed in the obtuse marginal branch and a 3.5 x 12 mm long stent placed in the circumflex.  She remains on dual antiplatelet therapy including reduced dose Brilenta and is asymptomatic.

## 2020-06-27 NOTE — Telephone Encounter (Signed)
Pt's granddaughter called and said that Dr. Danise Mina is showing on her card as patient's pcp. He was also showing in the system as patient's pcp but he has never seen the patient. I removed his name from pcp field. Pt speaks spanish and she said it is hard to find a spanish speaking physician. They are asking if Dr. Darnell Level will accept her as a new patient? I let them know that we will give them a call after we know his response.

## 2020-06-27 NOTE — Progress Notes (Signed)
06/27/2020 Melodye Ped   11/06/1934  [id number]  Primary Physician Ria Bush, MD Primary Cardiologist: Lorretta Harp MD Lupe Carney, [place]  HPI:  April Short is a 85 y.o.  widowed Latino female accompanied by her daughters Regino Schultze and Constance Holster today. I last saw her in the office  10/19/2016. She was referred by Karis Juba for cardiac evaluation to be established in our practice. She recently relocated from [place] to [place] to be closer to family. She does have a history of diabetes. She had 2 drug-eluting stents placed in her circumflex and obtuse marginal branch and [place] 10/31/15 with a 2.5 x 30 mm long resolute stent placement in obtuse marginal branch and a 3.5 mm x 12 mm long placed in the circumflex. She is on appropriate medicines for systolic heart failure. She recently lost a significant amount of weight secondary to diuresis. She isambulatory but really does not leave the house. She denies chest pain. She does have some dyspnea on exertion. She is on dual therapy with Brilenta. ince I saw her in the office proximally 6 months ago she's remained currently stable. She denies chest pain or shortness of breath. She is not ambulatory with a cane. She was transitioned to interest. There was a discussion regarding ICD implantation for primary prevention however at 85 years old and not sure that this is necessarily a wise thing to do.   Since I saw her over 3 years ago she did move back to [place] and in the interim had an ICD implanted on 05/26/2017 ([facility]).  She is otherwise asymptomatic.   Current Meds  Medication Sig  . aspirin EC 81 MG tablet Take 81 mg by mouth daily. Swallow whole.  Marland Kitchen atorvastatin (LIPITOR) 40 MG tablet Take 40 mg by mouth daily.  . carvedilol (COREG) 25 MG tablet Take 25 mg by mouth 2 (two) times daily with a meal.  . sacubitril-valsartan (ENTRESTO) 49-51 MG Take 1 tablet by mouth 2 (two) times daily.  . ticagrelor (BRILINTA)  60 MG TABS tablet Take 1 tablet (60 mg total) by mouth 2 (two) times daily.  . [DISCONTINUED] sacubitril-valsartan (ENTRESTO) 49-51 MG Take 1 tablet by mouth 2 (two) times daily.  . [DISCONTINUED] ticagrelor (BRILINTA) 60 MG TABS tablet Take by mouth 2 (two) times daily.     No Known Allergies  Social History   Socioeconomic History  . Marital status: Married    Spouse name: Not on file  . Number of children: 7  . Years of education: Not on file  . Highest education level: Not on file  Occupational History  . Not on file  Tobacco Use  . Smoking status: Never Smoker  . Smokeless tobacco: Never Used  Substance and Sexual Activity  . Alcohol use: Yes    Alcohol/week: 0.0 standard drinks    Comment: occasionally  . Drug use: No  . Sexual activity: Not on file  Other Topics Concern  . Not on file  Social History Narrative  . Not on file   Social Determinants of Health   Financial Resource Strain:   . Difficulty of Paying Living Expenses: Not on file  Food Insecurity:   . Worried About Charity fundraiser in the Last Year: Not on file  . Ran Out of Food in the Last Year: Not on file  Transportation Needs:   . Lack of Transportation (Medical): Not on file  . Lack of Transportation (Non-Medical): Not on  file  Physical Activity:   . Days of Exercise per Week: Not on file  . Minutes of Exercise per Session: Not on file  Stress:   . Feeling of Stress : Not on file  Social Connections:   . Frequency of Communication with Friends and Family: Not on file  . Frequency of Social Gatherings with Friends and Family: Not on file  . Attends Religious Services: Not on file  . Active Member of Clubs or Organizations: Not on file  . Attends Archivist Meetings: Not on file  . Marital Status: Not on file  Intimate Partner Violence:   . Fear of Current or Ex-Partner: Not on file  . Emotionally Abused: Not on file  . Physically Abused: Not on file  . Sexually Abused: Not on  file     Review of Systems: General: negative for chills, fever, night sweats or weight changes.  Cardiovascular: negative for chest pain, dyspnea on exertion, edema, orthopnea, palpitations, paroxysmal nocturnal dyspnea or shortness of breath Dermatological: negative for rash Respiratory: negative for cough or wheezing Urologic: negative for hematuria Abdominal: negative for nausea, vomiting, diarrhea, bright red blood per rectum, melena, or hematemesis Neurologic: negative for visual changes, syncope, or dizziness All other systems reviewed and are otherwise negative except as noted above.    Blood pressure (!) 92/54, pulse 61, height 5\' 3"  (1.6 m), weight 126 lb 11.2 oz (57.5 kg), SpO2 94 %.  General appearance: alert and no distress Neck: no adenopathy, no carotid bruit, no JVD, supple, symmetrical, trachea midline and thyroid not enlarged, symmetric, no tenderness/mass/nodules Lungs: clear to auscultation bilaterally Heart: regular rate and rhythm, S1, S2 normal, no murmur, click, rub or gallop Extremities: extremities normal, atraumatic, no cyanosis or edema Pulses: 2+ and symmetric Skin: Skin color, texture, turgor normal. No rashes or lesions Neurologic: Alert and oriented X 3, normal strength and tone. Normal symmetric reflexes. Normal coordination and gait  EKG atrially paced rhythm at 61.  I personally reviewed this EKG.  ASSESSMENT AND PLAN:   Ischemic cardiomyopathy History ischemic cardiomyopathy with EF last checked 02/04/2016 at 20 to 25% with mild MR and severe left atrial enlargement.  PA pressures were mildly elevated at 50.  She is on optimal medical therapy including Entresto and carvedilol and is otherwise asymptomatic.  I am going to recheck a 2D echocardiogram.  In the interim, she did have a Randall ICD placed in [place] 05/26/2017 (model number [id number] [id number] Q) going arrange for her to see one of the electrophysiologist to follow her ICD as an  outpatient.  HLD (hyperlipidemia) History of hyperlipidemia on statin therapy.  We will recheck a lipid liver profile.  CAD S/P percutaneous coronary angioplasty History of CAD status post 2 drug-eluting stents placed in [place] 10/31/2015 with a 2.5 x 30 mm long resolute stent placed in the obtuse marginal branch and a 3.5 x 12 mm long stent placed in the circumflex.  She remains on dual antiplatelet therapy including reduced dose Brilenta and is asymptomatic.      Lorretta Harp MD FACP,FACC,FAHA, [facility] 06/27/2020 11:10 AM

## 2020-06-27 NOTE — Telephone Encounter (Signed)
Ok to schedule in open 30 min appt - they may need to wait to be seen for a while as I'm very full.

## 2020-06-27 NOTE — Assessment & Plan Note (Signed)
History ischemic cardiomyopathy with EF last checked 02/04/2016 at 20 to 25% with mild MR and severe left atrial enlargement.  PA pressures were mildly elevated at 50.  She is on optimal medical therapy including Entresto and carvedilol and is otherwise asymptomatic.  I am going to recheck a 2D echocardiogram.  In the interim, she did have a Schlusser ICD placed in [place] 05/26/2017 (model number [id number] [id number] Q) going arrange for her to see one of the electrophysiologist to follow her ICD as an outpatient.

## 2020-06-28 LAB — CBC
Hematocrit: 35 % (ref 34.0–46.6)
Hemoglobin: 11.3 g/dL (ref 11.1–15.9)
MCH: 29.4 pg (ref 26.6–33.0)
MCHC: 32.3 g/dL (ref 31.5–35.7)
MCV: 91 fL (ref 79–97)
Platelets: 184 10*3/uL (ref 150–450)
RBC: 3.84 x10E6/uL (ref 3.77–5.28)
RDW: 13.2 % (ref 11.7–15.4)
WBC: 7.8 10*3/uL (ref 3.4–10.8)

## 2020-06-28 LAB — LIPID PANEL
Chol/HDL Ratio: 3.2 ratio (ref 0.0–4.4)
Cholesterol, Total: 124 mg/dL (ref 100–199)
HDL: 39 mg/dL — ABNORMAL LOW (ref >39)
LDL Chol Calc (NIH): 55 mg/dL (ref 0–99)
Triglycerides: 180 mg/dL — ABNORMAL HIGH (ref 0–149)
VLDL Cholesterol Cal: 30 mg/dL (ref 5–40)

## 2020-06-28 LAB — COMPREHENSIVE METABOLIC PANEL
ALT: 16 IU/L (ref 0–32)
AST: 19 IU/L (ref 0–40)
Albumin/Globulin Ratio: 1.2 (ref 1.2–2.2)
Albumin: 4.4 g/dL (ref 3.6–4.6)
Alkaline Phosphatase: 103 IU/L (ref 44–121)
BUN/Creatinine Ratio: 22 (ref 12–28)
BUN: 25 mg/dL (ref 8–27)
Bilirubin Total: 0.6 mg/dL (ref 0.0–1.2)
CO2: 23 mmol/L (ref 20–29)
Calcium: 10.1 mg/dL (ref 8.7–10.3)
Chloride: 99 mmol/L (ref 96–106)
Creatinine, Ser: 1.16 mg/dL — ABNORMAL HIGH (ref 0.57–1.00)
GFR calc Af Amer: 50 mL/min/{1.73_m2} — ABNORMAL LOW (ref >59)
GFR calc non Af Amer: 44 mL/min/{1.73_m2} — ABNORMAL LOW (ref >59)
Globulin, Total: 3.6 g/dL (ref 1.5–4.5)
Glucose: 180 mg/dL — ABNORMAL HIGH (ref 65–99)
Potassium: 5.3 mmol/L — ABNORMAL HIGH (ref 3.5–5.2)
Sodium: 135 mmol/L (ref 134–144)
Total Protein: 8 g/dL (ref 6.0–8.5)

## 2020-06-28 LAB — TSH: TSH: 0.788 u[IU]/mL (ref 0.450–4.500)

## 2020-06-30 NOTE — Telephone Encounter (Signed)
Called to schedule appointment with Dr.Gutierrez. LVM to call back.

## 2020-07-08 NOTE — Telephone Encounter (Signed)
Called patient to schedule appointment. Unable to leave voicemail. Letter sent.

## 2020-07-11 ENCOUNTER — Encounter: Payer: Self-pay | Admitting: Internal Medicine

## 2020-07-11 ENCOUNTER — Ambulatory Visit (INDEPENDENT_AMBULATORY_CARE_PROVIDER_SITE_OTHER): Payer: Medicare Other | Admitting: Internal Medicine

## 2020-07-11 ENCOUNTER — Other Ambulatory Visit: Payer: Self-pay

## 2020-07-11 VITALS — BP 114/58 | HR 79 | Ht 63.0 in | Wt 127.8 lb

## 2020-07-11 DIAGNOSIS — I255 Ischemic cardiomyopathy: Secondary | ICD-10-CM | POA: Diagnosis not present

## 2020-07-11 DIAGNOSIS — Z23 Encounter for immunization: Secondary | ICD-10-CM

## 2020-07-11 DIAGNOSIS — Z95810 Presence of automatic (implantable) cardiac defibrillator: Secondary | ICD-10-CM | POA: Diagnosis not present

## 2020-07-11 LAB — CUP PACEART INCLINIC DEVICE CHECK
Battery Remaining Longevity: 57 mo
Brady Statistic RA Percent Paced: 0 %
Brady Statistic RV Percent Paced: 99 %
Date Time Interrogation Session: 20211022102708
HighPow Impedance: 79.875
HighPow Impedance: 79.875
Implantable Lead Implant Date: 20180906
Implantable Lead Implant Date: 20180906
Implantable Lead Implant Date: 20180906
Implantable Lead Location: 753858
Implantable Lead Location: 753859
Implantable Lead Location: 753860
Implantable Pulse Generator Implant Date: 20180906
Lead Channel Impedance Value: 1825 Ohm
Lead Channel Impedance Value: 1825 Ohm
Lead Channel Impedance Value: 512.5 Ohm
Lead Channel Impedance Value: 512.5 Ohm
Lead Channel Impedance Value: 612.5 Ohm
Lead Channel Impedance Value: 612.5 Ohm
Lead Channel Pacing Threshold Amplitude: 0.625 V
Lead Channel Pacing Threshold Amplitude: 0.625 V
Lead Channel Pacing Threshold Amplitude: 0.875 V
Lead Channel Pacing Threshold Amplitude: 0.875 V
Lead Channel Pacing Threshold Amplitude: 1.625 V
Lead Channel Pacing Threshold Amplitude: 1.625 V
Lead Channel Pacing Threshold Pulse Width: 0.5 ms
Lead Channel Pacing Threshold Pulse Width: 0.5 ms
Lead Channel Pacing Threshold Pulse Width: 0.5 ms
Lead Channel Pacing Threshold Pulse Width: 0.5 ms
Lead Channel Pacing Threshold Pulse Width: 0.5 ms
Lead Channel Pacing Threshold Pulse Width: 0.5 ms
Lead Channel Sensing Intrinsic Amplitude: 1.2 mV
Lead Channel Sensing Intrinsic Amplitude: 1.2 mV
Lead Channel Sensing Intrinsic Amplitude: 1.5 mV
Lead Channel Sensing Intrinsic Amplitude: 1.5 mV
Lead Channel Sensing Intrinsic Amplitude: 11.9 mV
Lead Channel Sensing Intrinsic Amplitude: 11.9 mV
Lead Channel Sensing Intrinsic Amplitude: 11.9 mV
Lead Channel Sensing Intrinsic Amplitude: 11.9 mV
Lead Channel Setting Pacing Amplitude: 1.875
Lead Channel Setting Pacing Amplitude: 2 V
Lead Channel Setting Pacing Amplitude: 2.625
Lead Channel Setting Pacing Pulse Width: 0.5 ms
Lead Channel Setting Pacing Pulse Width: 0.5 ms
Lead Channel Setting Sensing Sensitivity: 0.5 mV
Pulse Gen Serial Number: 9776461

## 2020-07-11 NOTE — Progress Notes (Signed)
HPI Mrs. April Short is referred today by Dr. Gwenlyn Found for evaluation of and followup of her ICD. She is a pleasant 85 yo woman with CAD, s/p MI, severe LV dysfunction, s/p Biv ICD insertion. She has moved to [facility] to be closer to her family. She has not had an ICD shock. She is s/p PCI/stenting in [place]. Her daughter notes that she has had some trouble with her taste and vascular dementia.   No Known Allergies   Current Outpatient Medications  Medication Sig Dispense Refill  . aspirin EC 81 MG tablet Take 81 mg by mouth daily. Swallow whole.    Marland Kitchen atorvastatin (LIPITOR) 40 MG tablet Take 40 mg by mouth daily.    . carvedilol (COREG) 25 MG tablet Take 25 mg by mouth 2 (two) times daily with a meal.    . sacubitril-valsartan (ENTRESTO) 49-51 MG Take 1 tablet by mouth 2 (two) times daily. 60 tablet 12  . ticagrelor (BRILINTA) 60 MG TABS tablet Take 1 tablet (60 mg total) by mouth 2 (two) times daily. 60 tablet 12   No current facility-administered medications for this visit.     Past Medical History:  Diagnosis Date  . Anemia   . Arthritis   . CAD (coronary artery disease)    a. 10/2015 PCI [facility]): OM (2.5x30 Resolute DES), LCX (3.5x12 Resolute DES).  . Cervical cancer ([place]) 2005  . Chronic systolic CHF (congestive heart failure) ([place])    a. 01/2016 Echo: EF 20-25%, diff HK, septal-lateral dyssynchrony. Mild MR, mod to sev dil LA, nl RV, mod dil RA, mod TR, PASP 48mmHg.  . Colon cancer ([place]) 2001  . DM (diabetes mellitus) ([place])   . HLD (hyperlipidemia)   . Hypertensive heart disease   . Ischemic cardiomyopathy    a. 01/2016 Echo: EF 20-25%.  Marland Kitchen LBBB (left bundle branch block)     ROS:   All systems reviewed and negative except as noted in the HPI.   Past Surgical History:  Procedure Laterality Date  . COLON RESECTION  2001  . CORONARY STENT PLACEMENT    . TOTAL ABDOMINAL HYSTERECTOMY  2005     Family History  Problem Relation Age of Onset    . Colon cancer Mother   . Stomach cancer Son   . Diabetes Sister      Social History   Socioeconomic History  . Marital status: Married    Spouse name: Not on file  . Number of children: 7  . Years of education: Not on file  . Highest education level: Not on file  Occupational History  . Not on file  Tobacco Use  . Smoking status: Never Smoker  . Smokeless tobacco: Never Used  Substance and Sexual Activity  . Alcohol use: Yes    Alcohol/week: 0.0 standard drinks    Comment: occasionally  . Drug use: No  . Sexual activity: Not on file  Other Topics Concern  . Not on file  Social History Narrative  . Not on file   Social Determinants of Health   Financial Resource Strain:   . Difficulty of Paying Living Expenses: Not on file  Food Insecurity:   . Worried About Charity fundraiser in the Last Year: Not on file  . Ran Out of Food in the Last Year: Not on file  Transportation Needs:   . Lack of Transportation (Medical): Not on file  . Lack of Transportation (Non-Medical): Not on file  Physical Activity:   . Days of Exercise per Week: Not on file  . Minutes of Exercise per Session: Not on file  Stress:   . Feeling of Stress : Not on file  Social Connections:   . Frequency of Communication with Friends and Family: Not on file  . Frequency of Social Gatherings with Friends and Family: Not on file  . Attends Religious Services: Not on file  . Active Member of Clubs or Organizations: Not on file  . Attends Archivist Meetings: Not on file  . Marital Status: Not on file  Intimate Partner Violence:   . Fear of Current or Ex-Partner: Not on file  . Emotionally Abused: Not on file  . Physically Abused: Not on file  . Sexually Abused: Not on file     BP (!) 114/58   Pulse 79   Ht 5\' 3"  (1.6 m)   Wt 127 lb 12.8 oz (58 kg)   SpO2 95%   BMI 22.64 kg/m   Physical Exam:  Well appearing NAD HEENT: Unremarkable Neck:  No JVD, no thyromegally Lymphatics:   No adenopathy Back:  No CVA tenderness Lungs:  Clear with no wheezes HEART:  Regular rate rhythm, no murmurs, no rubs, no clicks Abd:  soft, positive bowel sounds, no organomegally, no rebound, no guarding Ext:  2 plus pulses, no edema, no cyanosis, no clubbing Skin:  No rashes no nodules Neuro:  CN II through XII intact, motor grossly intact  DEVICE  Normal device function.  See PaceArt for details.   Assess/Plan: 1. ICD - her Anderson ICD is working normally. We will recheck in several months. 2. ICM - she denies anginal symptoms. She is s/p PCI of the OM and LCX. She will continue Brilinta.  Carleene Overlie Odell Choung,MD

## 2020-07-11 NOTE — Patient Instructions (Addendum)
Medication Instructions:  Your physician recommends that you continue on your current medications as directed. Please refer to the Current Medication list given to you today.  Labwork: None ordered.  Testing/Procedures: None ordered.  Follow-Up: Your physician wants you to follow-up in: one year with Dr. Lovena Le.   You will receive a reminder letter in the mail two months in advance. If you don't receive a letter, please call our office to schedule the follow-up appointment.  Remote monitoring is used to monitor your ICD from home. This monitoring reduces the number of office visits required to check your device to one time per year. It allows [place] to keep an eye on the functioning of your device to ensure it is working properly. You are scheduled for a device check from home on 10/10/2020. You may send your transmission at any time that day. If you have a wireless device, the transmission will be sent automatically. After your physician reviews your transmission, you will receive a postcard with your next transmission date.    Any Other Special Instructions Will Be Listed Below (If Applicable).  If you need a refill on your cardiac medications before your next appointment, please call your pharmacy.

## 2020-08-05 ENCOUNTER — Encounter: Payer: Self-pay | Admitting: Family Medicine

## 2020-08-05 ENCOUNTER — Other Ambulatory Visit: Payer: Self-pay

## 2020-08-05 ENCOUNTER — Ambulatory Visit (INDEPENDENT_AMBULATORY_CARE_PROVIDER_SITE_OTHER): Payer: Medicare Other | Admitting: Family Medicine

## 2020-08-05 VITALS — BP 122/64 | HR 69 | Temp 97.5°F | Ht 61.25 in | Wt 127.1 lb

## 2020-08-05 DIAGNOSIS — E1140 Type 2 diabetes mellitus with diabetic neuropathy, unspecified: Secondary | ICD-10-CM | POA: Diagnosis not present

## 2020-08-05 DIAGNOSIS — I5022 Chronic systolic (congestive) heart failure: Secondary | ICD-10-CM

## 2020-08-05 DIAGNOSIS — E1169 Type 2 diabetes mellitus with other specified complication: Secondary | ICD-10-CM

## 2020-08-05 DIAGNOSIS — I255 Ischemic cardiomyopathy: Secondary | ICD-10-CM

## 2020-08-05 DIAGNOSIS — I110 Hypertensive heart disease with heart failure: Secondary | ICD-10-CM

## 2020-08-05 DIAGNOSIS — G3184 Mild cognitive impairment, so stated: Secondary | ICD-10-CM | POA: Insufficient documentation

## 2020-08-05 DIAGNOSIS — E1165 Type 2 diabetes mellitus with hyperglycemia: Secondary | ICD-10-CM | POA: Diagnosis not present

## 2020-08-05 DIAGNOSIS — E785 Hyperlipidemia, unspecified: Secondary | ICD-10-CM

## 2020-08-05 DIAGNOSIS — F4321 Adjustment disorder with depressed mood: Secondary | ICD-10-CM

## 2020-08-05 DIAGNOSIS — R413 Other amnesia: Secondary | ICD-10-CM | POA: Insufficient documentation

## 2020-08-05 DIAGNOSIS — Z95810 Presence of automatic (implantable) cardiac defibrillator: Secondary | ICD-10-CM

## 2020-08-05 DIAGNOSIS — Z9861 Coronary angioplasty status: Secondary | ICD-10-CM

## 2020-08-05 DIAGNOSIS — IMO0002 Reserved for concepts with insufficient information to code with codable children: Secondary | ICD-10-CM

## 2020-08-05 DIAGNOSIS — I2510 Atherosclerotic heart disease of native coronary artery without angina pectoris: Secondary | ICD-10-CM

## 2020-08-05 LAB — POCT GLYCOSYLATED HEMOGLOBIN (HGB A1C): Hemoglobin A1C: 8.6 % — AB (ref 4.0–5.6)

## 2020-08-05 NOTE — Assessment & Plan Note (Addendum)
Continue atorvastatin. Check FLP next fasting labs.

## 2020-08-05 NOTE — Assessment & Plan Note (Signed)
Reviewed current stressors including recent move locally from [place]. May benefit from medication to help. Will reassess at f/u visit.

## 2020-08-05 NOTE — Assessment & Plan Note (Signed)
BP stable on entresto and carvedilol BID

## 2020-08-05 NOTE — Assessment & Plan Note (Signed)
Continues brillinta and aspirin.

## 2020-08-05 NOTE — Progress Notes (Signed)
Patient ID: April Short, female    DOB: 12/04/1934, 85 y.o.   MRN: [id number]  This visit was conducted in person.  BP 122/64 (BP Location: Right Arm, Patient Position: Sitting, Cuff Size: Normal)   Pulse 69   Temp (!) 97.5 F (36.4 C) (Temporal)   Ht 5' 1.25" (1.556 m)   Wt 127 lb 1 oz (57.6 kg)   SpO2 95%   BMI 23.81 kg/m    CC: new pt to establish  Subjective:   HPI: April Short is a 85 y.o. female presenting on 08/05/2020 for New Patient (Initial Visit) (Pt accompanied by daughter, Regino Schultze- temp 97.8.)   New pt to establish.  H/o CAD s/p MI and PCI (OM and LCx), severe LV dysfunction with ischemic CM, s/p ([employer]) BiV ICD insertion 05/2017. Continues brilinta and aspirin.  Moved from [place] 5 months ago to be closer to family. 3 children live locally. Prior saw PCP Dr Clementeen Graham - records will be requested.   Some difficulty getting accustomed to this area. 2 sisters died last year - this was difficult for her.  DM - dx age 74. H/o diabetic neuropathy to hands x5 yrs. No neck pain. Doesn't check sugars regularly, not on medication. Saw podiatrist - normal eval.    ?Vascular dementia - memory difficulties noted 2 yrs ago after MI. Forgets questions, forgets meals, change in taste noted. This has affected appetite. She has started drinking ensure - rec switch to glucerna given DM hx.   From [place], lived in [place] then [place].  Widow 2018      Relevant past medical, surgical, family and social history reviewed and updated as indicated. Interim medical history since our last visit reviewed. Allergies and medications reviewed and updated. Outpatient Medications Prior to Visit  Medication Sig Dispense Refill  . aspirin EC 81 MG tablet Take 81 mg by mouth daily. Swallow whole.    Marland Kitchen atorvastatin (LIPITOR) 40 MG tablet Take 40 mg by mouth daily.    . carvedilol (COREG) 25 MG tablet Take 25 mg by mouth 2 (two) times daily with a meal.    . sacubitril-valsartan (ENTRESTO)  49-51 MG Take 1 tablet by mouth 2 (two) times daily. 60 tablet 12  . ticagrelor (BRILINTA) 60 MG TABS tablet Take 1 tablet (60 mg total) by mouth 2 (two) times daily. 60 tablet 12   No facility-administered medications prior to visit.     Per HPI unless specifically indicated in ROS section below Review of Systems Objective:  BP 122/64 (BP Location: Right Arm, Patient Position: Sitting, Cuff Size: Normal)   Pulse 69   Temp (!) 97.5 F (36.4 C) (Temporal)   Ht 5' 1.25" (1.556 m)   Wt 127 lb 1 oz (57.6 kg)   SpO2 95%   BMI 23.81 kg/m   Wt Readings from Last 3 Encounters:  08/05/20 127 lb 1 oz (57.6 kg)  07/11/20 127 lb 12.8 oz (58 kg)  06/27/20 126 lb 11.2 oz (57.5 kg)      Physical Exam Vitals and nursing note reviewed.  Constitutional:      Appearance: Normal appearance. She is not ill-appearing.  Eyes:     Extraocular Movements: Extraocular movements intact.     Pupils: Pupils are equal, round, and reactive to light.  Cardiovascular:     Rate and Rhythm: Normal rate and regular rhythm.     Pulses: Normal pulses.     Heart sounds: Normal heart sounds. No murmur heard.  Pulmonary:     Effort: Pulmonary effort is normal. No respiratory distress.     Breath sounds: Normal breath sounds. No wheezing, rhonchi or rales.  Musculoskeletal:     Right lower leg: No edema.     Left lower leg: No edema.  Skin:    General: Skin is warm and dry.  Neurological:     Mental Status: She is alert.  Psychiatric:        Mood and Affect: Mood normal.        Behavior: Behavior normal.       Results for orders placed or performed in visit on 08/05/20  POCT glycosylated hemoglobin (Hb A1C)  Result Value Ref Range   Hemoglobin A1C 8.6 (A) 4.0 - 5.6 %   HbA1c POC (<> result, manual entry)     HbA1c, POC (prediabetic range)     HbA1c, POC (controlled diabetic range)      Depression screen [facility] 2/9 08/05/2020 01/14/2016  Decreased Interest 2 3  Down, Depressed, Hopeless 1 3  PHQ - 2  Score 3 6  Altered sleeping 2 3  Tired, decreased energy 0 3  Change in appetite 2 3  Feeling bad or failure about yourself  0 1  Trouble concentrating 1 0  Moving slowly or fidgety/restless 2 2  Suicidal thoughts 0 3  PHQ-9 Score 10 21  Difficult doing work/chores Somewhat difficult Somewhat difficult    Assessment & Plan:  This visit occurred during the SARS-CoV-2 public health emergency.  Safety protocols were in place, including screening questions prior to the visit, additional usage of staff PPE, and extensive cleaning of exam room while observing appropriate contact time as indicated for disinfecting solutions.   Problem List Items Addressed This Visit    Type 2 diabetes mellitus, uncontrolled, with neuropathy ([place]) - Primary    Not on any antihyperglycemic. Managing with diet. Will update A1c today - elevated at 8.6%. Discussed changing to glucerna protein supplements. Reassess at f/u visit, if persistently elevated, low threshold to add medication - would consider weekly GLP1 RA vs SGLT2 for cardiovascular benefit.       Relevant Orders   POCT glycosylated hemoglobin (Hb A1C) (Completed)   Memory difficulty    Daughter raises ?vascular dementia as memory difficulties seem to have started after MI 2018. Will need further evaluation at future OV.       Ischemic cardiomyopathy    Appreciate cards care.  Has not had repeat echocardiogram yet.       ICD (implantable cardioverter-defibrillator) in place    Has established with EP Lovena Le)       Hypertensive heart disease    BP stable on entresto and carvedilol BID      Hyperlipidemia associated with type 2 diabetes mellitus ([place])    Continue atorvastatin. Check FLP next fasting labs.       Chronic systolic CHF (congestive heart failure) ([facility])    Regularly sees cardiology, on entresto and coreg.       CAD S/P percutaneous coronary angioplasty    Continues brillinta and aspirin.       Adjustment disorder with  depressed mood    Reviewed current stressors including recent move locally from [place]. May benefit from medication to help. Will reassess at f/u visit.           No orders of the defined types were placed in this encounter.  Orders Placed This Encounter  Procedures  . POCT glycosylated hemoglobin (Hb A1C)  Patient Instructions  A1c today.  trate glucerna en vez de ensure.  Gusto conocerla hoy.  Podriamos considerar medicamento para ayudar a la tristeza.  Regresar en 3 meses para proxima visita.   Follow up plan: Return in about 3 months (around 11/05/2020) for follow up visit.  Ria Bush, MD

## 2020-08-05 NOTE — Assessment & Plan Note (Addendum)
Appreciate cards care.  Has not had repeat echocardiogram yet.

## 2020-08-05 NOTE — Assessment & Plan Note (Signed)
Regularly sees cardiology, on entresto and coreg.

## 2020-08-05 NOTE — Assessment & Plan Note (Signed)
Has established with EP Lovena Le)

## 2020-08-05 NOTE — Patient Instructions (Addendum)
A1c today.  trate glucerna en vez de ensure.  Gusto conocerla hoy.  Podriamos considerar medicamento para ayudar a la tristeza.  Regresar en 3 meses para proxima visita.

## 2020-08-05 NOTE — Assessment & Plan Note (Signed)
Daughter raises ?vascular dementia as memory difficulties seem to have started after MI 2018. Will need further evaluation at future OV.

## 2020-08-05 NOTE — Assessment & Plan Note (Signed)
Not on any antihyperglycemic. Managing with diet. Will update A1c today - elevated at 8.6%. Discussed changing to glucerna protein supplements. Reassess at f/u visit, if persistently elevated, low threshold to add medication - would consider weekly GLP1 RA vs SGLT2 for cardiovascular benefit.

## 2020-08-09 ENCOUNTER — Encounter: Payer: Self-pay | Admitting: Family Medicine

## 2020-09-04 ENCOUNTER — Other Ambulatory Visit: Payer: Self-pay

## 2020-09-04 ENCOUNTER — Encounter: Payer: Self-pay | Admitting: Podiatry

## 2020-09-04 ENCOUNTER — Telehealth: Payer: Self-pay

## 2020-09-04 ENCOUNTER — Ambulatory Visit (INDEPENDENT_AMBULATORY_CARE_PROVIDER_SITE_OTHER): Payer: Medicare Other | Admitting: Podiatry

## 2020-09-04 DIAGNOSIS — E1140 Type 2 diabetes mellitus with diabetic neuropathy, unspecified: Secondary | ICD-10-CM | POA: Diagnosis not present

## 2020-09-04 DIAGNOSIS — M79675 Pain in left toe(s): Secondary | ICD-10-CM

## 2020-09-04 DIAGNOSIS — E1165 Type 2 diabetes mellitus with hyperglycemia: Secondary | ICD-10-CM | POA: Diagnosis not present

## 2020-09-04 DIAGNOSIS — M79674 Pain in right toe(s): Secondary | ICD-10-CM

## 2020-09-04 DIAGNOSIS — B351 Tinea unguium: Secondary | ICD-10-CM

## 2020-09-04 DIAGNOSIS — IMO0002 Reserved for concepts with insufficient information to code with codable children: Secondary | ICD-10-CM

## 2020-09-04 NOTE — Progress Notes (Signed)
This patient returns to my office for at risk foot care.  This patient requires this care by a professional since this patient will be at risk due to having diabetes and renal insufficiency and neuropathy. This patient is unable to cut nails himself since the patient cannot reach his nails.These nails are painful walking and wearing shoes.  This patient presents for at risk foot care today.  General Appearance  Alert, conversant and in no acute stress.  Vascular  Dorsalis pedis are palpable  bilaterally. Posterior tibial pulses are absent  B/L. Capillary return is within normal limits  bilaterally. Temperature is within normal limits  bilaterally.  Neurologic  Senn-Weinstein monofilament wire test within normal limits  bilaterally. Muscle power within normal limits bilaterally.  Nails Thick disfigured discolored nails with subungual debris  from hallux to fifth toes bilaterally. No evidence of bacterial infection or drainage bilaterally.  Orthopedic  No limitations of motion  feet .  No crepitus or effusions noted.  No bony pathology or digital deformities noted. HAV  B/L.  Skin  normotropic skin with no porokeratosis noted bilaterally.  No signs of infections or ulcers noted.     Onychomycosis  Pain in right toes  Pain in left toes  Consent was obtained for treatment procedures.   Mechanical debridement of nails 1-5  bilaterally performed with a nail nipper.  Filed with dremel without incident.    Return office visit  3 months.                   Told patient to return for periodic foot care and evaluation due to potential at risk complications.   Gardiner Barefoot DPM
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
## 2020-09-05 NOTE — Telephone Encounter (Signed)
Noted! Thank you

## 2020-10-16 ENCOUNTER — Ambulatory Visit (INDEPENDENT_AMBULATORY_CARE_PROVIDER_SITE_OTHER): Payer: Medicare Other

## 2020-10-16 DIAGNOSIS — I255 Ischemic cardiomyopathy: Secondary | ICD-10-CM | POA: Diagnosis not present

## 2020-10-16 LAB — CUP PACEART REMOTE DEVICE CHECK
Battery Remaining Longevity: 59 mo
Battery Remaining Percentage: 64 %
Battery Voltage: 2.96 V
Brady Statistic AP VP Percent: 3.4 %
Brady Statistic AP VS Percent: 1 %
Brady Statistic AS VP Percent: 93 %
Brady Statistic AS VS Percent: 3.4 %
Brady Statistic RA Percent Paced: 3.2 %
Date Time Interrogation Session: 20220127124611
HighPow Impedance: 79 Ohm
HighPow Impedance: 79 Ohm
Implantable Lead Implant Date: 20180906
Implantable Lead Implant Date: 20180906
Implantable Lead Implant Date: 20180906
Implantable Lead Location: 753858
Implantable Lead Location: 753859
Implantable Lead Location: 753860
Implantable Pulse Generator Implant Date: 20180906
Lead Channel Impedance Value: 1725 Ohm
Lead Channel Impedance Value: 450 Ohm
Lead Channel Impedance Value: 590 Ohm
Lead Channel Pacing Threshold Amplitude: 0.75 V
Lead Channel Pacing Threshold Amplitude: 0.75 V
Lead Channel Pacing Threshold Amplitude: 1.5 V
Lead Channel Pacing Threshold Pulse Width: 0.5 ms
Lead Channel Pacing Threshold Pulse Width: 0.5 ms
Lead Channel Pacing Threshold Pulse Width: 0.5 ms
Lead Channel Sensing Intrinsic Amplitude: 1 mV
Lead Channel Sensing Intrinsic Amplitude: 11.9 mV
Lead Channel Setting Pacing Amplitude: 1.75 V
Lead Channel Setting Pacing Amplitude: 2 V
Lead Channel Setting Pacing Amplitude: 2.5 V
Lead Channel Setting Pacing Pulse Width: 0.5 ms
Lead Channel Setting Pacing Pulse Width: 0.5 ms
Lead Channel Setting Sensing Sensitivity: 0.5 mV
Pulse Gen Serial Number: 9776461

## 2020-10-26 NOTE — Progress Notes (Signed)
Remote ICD transmission.   

## 2020-12-05 ENCOUNTER — Telehealth: Payer: Self-pay

## 2020-12-05 ENCOUNTER — Ambulatory Visit (INDEPENDENT_AMBULATORY_CARE_PROVIDER_SITE_OTHER): Payer: Medicare Other | Admitting: Family Medicine

## 2020-12-05 ENCOUNTER — Other Ambulatory Visit: Payer: Self-pay

## 2020-12-05 ENCOUNTER — Encounter: Payer: Self-pay | Admitting: Family Medicine

## 2020-12-05 VITALS — BP 120/60 | HR 69 | Temp 98.0°F | Ht 61.25 in | Wt 126.3 lb

## 2020-12-05 DIAGNOSIS — E785 Hyperlipidemia, unspecified: Secondary | ICD-10-CM | POA: Diagnosis not present

## 2020-12-05 DIAGNOSIS — IMO0002 Reserved for concepts with insufficient information to code with codable children: Secondary | ICD-10-CM

## 2020-12-05 DIAGNOSIS — E1140 Type 2 diabetes mellitus with diabetic neuropathy, unspecified: Secondary | ICD-10-CM | POA: Diagnosis not present

## 2020-12-05 DIAGNOSIS — F4321 Adjustment disorder with depressed mood: Secondary | ICD-10-CM

## 2020-12-05 DIAGNOSIS — E1165 Type 2 diabetes mellitus with hyperglycemia: Secondary | ICD-10-CM

## 2020-12-05 DIAGNOSIS — E1169 Type 2 diabetes mellitus with other specified complication: Secondary | ICD-10-CM

## 2020-12-05 DIAGNOSIS — R413 Other amnesia: Secondary | ICD-10-CM

## 2020-12-05 LAB — POCT GLYCOSYLATED HEMOGLOBIN (HGB A1C): Hemoglobin A1C: 7.6 % — AB (ref 4.0–5.6)

## 2020-12-05 MED ORDER — SERTRALINE HCL 25 MG PO TABS: 25.0000 mg | ORAL_TABLET | Freq: Every day | ORAL | 6 refills | Status: DC

## 2020-12-05 NOTE — Assessment & Plan Note (Signed)
Improved control over the past 3 months, adequate control for age. Discussed ideal goal A1c <7%. Will continue managing with diabetic diet. Foot exam today. Discussed DSME - they will consider and let me know if desire referral. Decline medication.

## 2020-12-05 NOTE — Assessment & Plan Note (Signed)
Continue atorvastatin. The ASCVD Risk score (Goff DC Jr., et al., 2013) failed to calculate for the following reasons:   The 2013 ASCVD risk score is only valid for ages 40 to 79  

## 2020-12-05 NOTE — Telephone Encounter (Signed)
Spoke with pt's daughter, Bethena Roys (on dpr), informing her I forgot to have pt sign medical release form.  Said she will get pt to come back to sign & date form.  Form can then be faxed to Dr. Clementeen Graham at [phone].  [Placed form at front office- yellow folders.]

## 2020-12-05 NOTE — Assessment & Plan Note (Signed)
No trouble endorsed by pt/daughter today.

## 2020-12-05 NOTE — Patient Instructions (Addendum)
Sign ROI for records from Dr Clementeen Graham in [place].  Revise marca preferida por el seguro de glucometro.  Piense y dejeme saber sobre remision para clases de diabetes.  Llame para hacer cita con oculista local.  Regresar en 6 meses para fisico/examen de medicare.  Tratemos medicina sertraline 25mg  diarios.

## 2020-12-05 NOTE — Assessment & Plan Note (Signed)
Ongoing struggle - will trial low dose SSRI. Reviewed side effects to watch for.

## 2020-12-05 NOTE — Progress Notes (Signed)
Patient ID: April Short, female    DOB: 1934-09-25, 86 y.o.   MRN: [id number]  This visit was conducted in person.  BP 120/60   Pulse 69   Temp 98 F (36.7 C) (Temporal)   Ht 5' 1.25" (1.556 m)   Wt 126 lb 5 oz (57.3 kg)   SpO2 96%   BMI 23.67 kg/m    CC: DM f/u visit  Subjective:   HPI: April Short is a 86 y.o. female presenting on 12/05/2020 for Diabetes (Here for 3 mo f/u.  Pt accompanied by daughter, April Short- temp 97.7.)   Established care late last year.  Daughter asks to establish care.   Regularly sees cardiology for ischemic cardiomyopathy. Has St Jude BiV ICD since 05/2017.   Ongoing depression - 2 sisters, husband recently in the past few years. Overall feels well. Sleeping well, good appetite. Daughter April Short stays with her. No concerns with memory.   DM - does not regularly check sugars. Compliant with antihyperglycemic regimen which includes: diet controlled. H/o diabetic neuropathy to hands - paresthesias. No trouble to feet. Last visit was drinking ensure - advised to transition to [place]. Denies low sugars or hypoglycemic symptoms. Denies paresthesias. Last diabetic eye exam DUE. Pneumovax: DUE. Prevnar: DUE. Glucometer brand: unsure. DSME: disucssed, may be interested. They decline medication for diabetes.  Lab Results  Component Value Date   HGBA1C 7.6 (A) 12/05/2020   Diabetic Foot Exam - Simple   Simple Foot Form Diabetic Foot exam was performed with the following findings: Yes 12/05/2020 10:37 AM  Visual Inspection See comments: Yes Sensation Testing Intact to touch and monofilament testing bilaterally: Yes Pulse Check Posterior Tibialis and Dorsalis pulse intact bilaterally: Yes Comments Callus to right medial foot at 1st MTPJ    Lab Results  Component Value Date   MICROALBUR 1.6 09/02/2016        Relevant past medical, surgical, family and social history reviewed and updated as indicated. Interim medical history since our last visit  reviewed. Allergies and medications reviewed and updated. Outpatient Medications Prior to Visit  Medication Sig Dispense Refill  . aspirin EC 81 MG tablet Take 81 mg by mouth daily. Swallow whole.    Marland Kitchen atorvastatin (LIPITOR) 40 MG tablet Take 40 mg by mouth daily.    . carvedilol (COREG) 25 MG tablet Take 25 mg by mouth 2 (two) times daily with a meal.    . sacubitril-valsartan (ENTRESTO) 49-51 MG Take 1 tablet by mouth 2 (two) times daily. 60 tablet 12  . ticagrelor (BRILINTA) 60 MG TABS tablet Take 1 tablet (60 mg total) by mouth 2 (two) times daily. 60 tablet 12   No facility-administered medications prior to visit.     Per HPI unless specifically indicated in ROS section below Review of Systems Objective:  BP 120/60   Pulse 69   Temp 98 F (36.7 C) (Temporal)   Ht 5' 1.25" (1.556 m)   Wt 126 lb 5 oz (57.3 kg)   SpO2 96%   BMI 23.67 kg/m   Wt Readings from Last 3 Encounters:  12/05/20 126 lb 5 oz (57.3 kg)  08/05/20 127 lb 1 oz (57.6 kg)  07/11/20 127 lb 12.8 oz (58 kg)      Physical Exam Vitals and nursing note reviewed.  Constitutional:      General: She is not in acute distress.    Appearance: Normal appearance. She is well-developed. She is not ill-appearing.  HENT:     Head: Normocephalic and  atraumatic.     Mouth/Throat:     Mouth: Mucous membranes are moist.     Pharynx: Oropharynx is clear. No oropharyngeal exudate or posterior oropharyngeal erythema.  Eyes:     General: No scleral icterus.    Extraocular Movements: Extraocular movements intact.     Conjunctiva/sclera: Conjunctivae normal.     Pupils: Pupils are equal, round, and reactive to light.  Cardiovascular:     Rate and Rhythm: Normal rate and regular rhythm.     Pulses: Normal pulses.     Heart sounds: Normal heart sounds. No murmur heard.   Pulmonary:     Effort: Pulmonary effort is normal. No respiratory distress.     Breath sounds: Normal breath sounds. No wheezing, rhonchi or rales.   Musculoskeletal:     Cervical back: Normal range of motion and neck supple.     Right lower leg: No edema.     Left lower leg: No edema.     Comments: See HPI for foot exam if done  Lymphadenopathy:     Cervical: No cervical adenopathy.  Skin:    General: Skin is warm and dry.     Findings: No rash.  Neurological:     Mental Status: She is alert.  Psychiatric:        Mood and Affect: Mood normal.        Behavior: Behavior normal.     Comments: Tearful with discussion of recent deaths in family        Results for orders placed or performed in visit on 12/05/20  POCT glycosylated hemoglobin (Hb A1C)  Result Value Ref Range   Hemoglobin A1C 7.6 (A) 4.0 - 5.6 %   HbA1c POC (<> result, manual entry)     HbA1c, POC (prediabetic range)     HbA1c, POC (controlled diabetic range)     Depression screen [facility] 2/9 12/05/2020 08/05/2020 01/14/2016  Decreased Interest 2 2 3   Down, Depressed, Hopeless 1 1 3   PHQ - 2 Score 3 3 6   Altered sleeping 1 2 3   Tired, decreased energy 1 0 3  Change in appetite 1 2 3   Feeling bad or failure about yourself  1 0 1  Trouble concentrating 1 1 0  Moving slowly or fidgety/restless 3 2 2   Suicidal thoughts 0 0 3  PHQ-9 Score 11 10 21   Difficult doing work/chores - Somewhat difficult Somewhat difficult   GAD 7 : Generalized Anxiety Score 12/05/2020 08/05/2020  Nervous, Anxious, on Edge 1 0  Control/stop worrying 1 0  Worry too much - different things 0 2  Trouble relaxing 0 1  Restless 0 0  Easily annoyed or irritable 2 2  Afraid - awful might happen 2 2  Total GAD 7 Score 6 7   Assessment & Plan:  This visit occurred during the SARS-CoV-2 public health emergency.  Safety protocols were in place, including screening questions prior to the visit, additional usage of staff PPE, and extensive cleaning of exam room while observing appropriate contact time as indicated for disinfecting solutions.   I have not received records from prior PCP - will  request again.  Problem List Items Addressed This Visit    Type 2 diabetes mellitus, uncontrolled, with neuropathy ([place]) - Primary    Improved control over the past 3 months, adequate control for age. Discussed ideal goal A1c <7%. Will continue managing with diabetic diet. Foot exam today. Discussed DSME - they will consider and let me know if desire referral.  Decline medication.       Relevant Orders   POCT glycosylated hemoglobin (Hb A1C) (Completed)   Hyperlipidemia associated with type 2 diabetes mellitus (HCC)    Continue atorvastatin.  The ASCVD Risk score Mikey Bussing DC Jr., et al., 2013) failed to calculate for the following reasons:   The 2013 ASCVD risk score is only valid for ages 46 to 17       Adjustment disorder with depressed mood    Ongoing struggle - will trial low dose SSRI. Reviewed side effects to watch for.       Memory difficulty    No trouble endorsed by pt/daughter today.           Meds ordered this encounter  Medications  . sertraline (ZOLOFT) 25 MG tablet    Sig: Take 1 tablet (25 mg total) by mouth daily.    Dispense:  30 tablet    Refill:  6   Orders Placed This Encounter  Procedures  . POCT glycosylated hemoglobin (Hb A1C)    Patient Instructions  Sign ROI for records from Dr Clementeen Graham in [place].  Revise marca preferida por el seguro de glucometro.  Piense y dejeme saber sobre remision para clases de diabetes.  Llame para hacer cita con oculista local.  Regresar en 6 meses para fisico/examen de medicare.  Tratemos medicina sertraline 25mg  diarios.   Follow up plan: Return in about 6 months (around 06/07/2021) for annual exam, prior fasting for blood work, medicare wellness visit.  Ria Bush, MD

## 2020-12-08 ENCOUNTER — Ambulatory Visit (INDEPENDENT_AMBULATORY_CARE_PROVIDER_SITE_OTHER): Payer: Medicare Other | Admitting: Podiatry

## 2020-12-08 ENCOUNTER — Encounter: Payer: Self-pay | Admitting: Podiatry

## 2020-12-08 DIAGNOSIS — B351 Tinea unguium: Secondary | ICD-10-CM | POA: Diagnosis not present

## 2020-12-08 DIAGNOSIS — E1165 Type 2 diabetes mellitus with hyperglycemia: Secondary | ICD-10-CM | POA: Diagnosis not present

## 2020-12-08 DIAGNOSIS — M79674 Pain in right toe(s): Secondary | ICD-10-CM | POA: Diagnosis not present

## 2020-12-08 DIAGNOSIS — E1140 Type 2 diabetes mellitus with diabetic neuropathy, unspecified: Secondary | ICD-10-CM

## 2020-12-08 DIAGNOSIS — M79675 Pain in left toe(s): Secondary | ICD-10-CM

## 2020-12-08 DIAGNOSIS — IMO0002 Reserved for concepts with insufficient information to code with codable children: Secondary | ICD-10-CM

## 2020-12-08 DIAGNOSIS — M2010 Hallux valgus (acquired), unspecified foot: Secondary | ICD-10-CM

## 2020-12-08 NOTE — Progress Notes (Signed)
This patient returns to my office for at risk foot care.  This patient requires this care by a professional since this patient will be at risk due to having diabetes and renal insufficiency and neuropathy. This patient is unable to cut nails himself since the patient cannot reach his nails.These nails are painful walking and wearing shoes.  Patient says she has pai in her right foot and points to her bunion.  She is accompanied by her daughter and interpreter.  This patient presents for at risk foot care today.  General Appearance  Alert, conversant and in no acute stress.  Vascular  Dorsalis pedis are palpable  bilaterally. Posterior tibial pulses are absent  B/L. Capillary return is within normal limits  bilaterally. Cold feet  Bilaterally.  Absent hair digitally.  Neurologic  Senn-Weinstein monofilament wire test within normal limits  bilaterally. Muscle power within normal limits bilaterally.  Nails Thick disfigured discolored nails with subungual debris  from hallux to fifth toes bilaterally. No evidence of bacterial infection or drainage bilaterally.  Orthopedic  No limitations of motion  feet .  No crepitus or effusions noted.  No bony pathology or digital deformities noted. HAV  B/L.  Skin  normotropic skin with no porokeratosis noted bilaterally.  No signs of infections or ulcers noted.     Onychomycosis  Pain in right toes  Pain in left toes  HAV  B/L.  Consent was obtained for treatment procedures.   Mechanical debridement of nails 1-5  bilaterally performed with a nail nipper.  Filed with dremel without incident.    Return office visit  3 months.                   Told patient to return for periodic foot care and evaluation due to potential at risk complications.   Gardiner Barefoot DPM

## 2020-12-23 NOTE — Progress Notes (Signed)
Cardiology Clinic Note   Patient Name: April Short Date of Encounter: 12/25/2020  Primary Care Provider:  Ria Bush, MD Primary Cardiologist:  Quay Burow, MD  Patient Profile    April Short 86 year old female presents the clinic today for follow-up evaluation of her ischemic cardiomyopathy.  Past Medical History    Past Medical History:  Diagnosis Date  . Anemia   . Arthritis   . CAD (coronary artery disease)    a. 10/2015 PCI [facility] [place]): OM (2.5x30 Resolute DES), LCX (3.5x12 Resolute DES).  . Cervical cancer ([place]) 2005  . Chronic systolic CHF (congestive heart failure) ([place])    a. 01/2016 Echo: EF 20-25%, diff HK, septal-lateral dyssynchrony. Mild MR, mod to sev dil LA, nl RV, mod dil RA, mod TR, PASP 25mmHg.  . Colon cancer ([place]) 2001  . DM (diabetes mellitus) ([place])   . History of chicken pox   . HLD (hyperlipidemia)   . Hypertensive heart disease   . Ischemic cardiomyopathy    a. 01/2016 Echo: EF 20-25%.  Marland Kitchen LBBB (left bundle branch block)    Past Surgical History:  Procedure Laterality Date  . COLON RESECTION  2001   colon cancer  . CORONARY STENT PLACEMENT    . TOTAL ABDOMINAL HYSTERECTOMY  2005   cervical cancer    Allergies  No Known Allergies  History of Present Illness    April Short has a PMH of ischemic cardiomyopathy, HLD, coronary artery disease, and diabetes.  She relocated to [place] from [place] to be closer to her family.  She previously underwent cardiac catheterization and received 2 DES to her circumflex and obtuse marginal branch in [place] 10/31/2015.  She previously was very sedentary and did report some dyspnea with exertion.  She was seen in follow-up on 06/27/2020 by Dr. Gwenlyn Found.  She had been lost to follow-up for over 3 years and had moved back to [place].  In the meantime she had an ICD implanted 05/26/2017.  She remained asymptomatic.  She was seen by Dr. Lovena Le 07/11/2020.  During that time she  denied ICD shock.  Her daughter reported that she was having some trouble with her taste and vascular dementia.  Her Saint Jude BiV device was working normally and recheck was fine for several months.  She denied chest pain.  She is seen in follow-up today with her daughter and states she feels well.  She reports that she walks most days of the week as long as the weather is nice.  She also is eating a low-sodium diet.  She states that overall she feels well.  She does report that she has a sick sister and would like to travel to see her for 2 weeks.  She also complains of some numbness in her hands.  She is a very avid writing her family history and has done this for quite some time.  I have asked her to use physical therapy hand putty to see if it will help with her symptoms.  I will give her the salty 6 diet sheet, have her increase her walking, have asked her to make sure she does not miss any medications during her travel and wearing a mask.  We will have her follow-up in 6 months.  Today she denies chest pain, shortness of breath, lower extremity edema, fatigue, palpitations, melena, hematuria, hemoptysis, diaphoresis, weakness, presyncope, syncope, orthopnea, and PND.   Home Medications    Prior to Admission medications   Medication Sig  Start Date End Date Taking? Authorizing Provider  aspirin EC 81 MG tablet Take 81 mg by mouth daily. Swallow whole.    [provider]  atorvastatin (LIPITOR) 40 MG tablet Take 40 mg by mouth daily.    [provider]  carvedilol (COREG) 25 MG tablet Take 25 mg by mouth 2 (two) times daily with a meal.    [provider]  sacubitril-valsartan (ENTRESTO) 49-51 MG Take 1 tablet by mouth 2 (two) times daily. 06/27/20   Lorretta Harp, MD  sertraline (ZOLOFT) 25 MG tablet Take 1 tablet (25 mg total) by mouth daily. 12/05/20   Ria Bush, MD  ticagrelor (BRILINTA) 60 MG TABS tablet Take 1 tablet (60 mg total) by mouth 2 (two) times  daily. 06/27/20   Lorretta Harp, MD    Family History    Family History  Problem Relation Age of Onset  . Colon cancer Mother   . Stomach cancer Son   . Diabetes Sister    She indicated that her mother is deceased. She indicated that her father is deceased. She indicated that the status of her sister is unknown. She indicated that the status of her son is unknown.  Social History    Social History   Socioeconomic History  . Marital status: Widowed    Spouse name: Not on file  . Number of children: 7  . Years of education: Not on file  . Highest education level: Not on file  Occupational History  . Not on file  Tobacco Use  . Smoking status: Never Smoker  . Smokeless tobacco: Never Used  Substance and Sexual Activity  . Alcohol use: Yes    Alcohol/week: 0.0 standard drinks    Comment: occasionally  . Drug use: No  . Sexual activity: Not on file  Other Topics Concern  . Not on file  Social History Narrative   Widow 2018, lives with daughter   From [place], lived in [place] then [place].    Moved to [facility] 2021 to be closer to family    Occ: retired   Activity:    Diet:    Social Determinants of Radio broadcast assistant Strain: Not on file  Food Insecurity: Not on file  Transportation Needs: Not on file  Physical Activity: Not on file  Stress: Not on file  Social Connections: Not on file  Intimate Partner Violence: Not on file     Review of Systems    General:  No chills, fever, night sweats or weight changes.  Cardiovascular:  No chest pain, dyspnea on exertion, edema, orthopnea, palpitations, paroxysmal nocturnal dyspnea. Dermatological: No rash, lesions/masses Respiratory: No cough, dyspnea Urologic: No hematuria, dysuria Abdominal:   No nausea, vomiting, diarrhea, bright red blood per rectum, melena, or hematemesis Neurologic:  No visual changes, wkns, changes in mental status. All other systems reviewed and are otherwise negative except as noted  above.  Physical Exam    VS:  BP (!) 94/52   Pulse 75   Ht 5\' 3"  (1.6 m)   Wt 126 lb 9.6 oz (57.4 kg)   SpO2 96%   BMI 22.43 kg/m  , BMI Body mass index is 22.43 kg/m. GEN: Well nourished, well developed, in no acute distress. HEENT: normal. Neck: Supple, no JVD, carotid bruits, or masses. Cardiac: RRR, no murmurs, rubs, or gallops. No clubbing, cyanosis, edema.  Radials/DP/PT 2+ and equal bilaterally.  Respiratory:  Respirations regular and unlabored, clear to auscultation bilaterally. GI: Soft, nontender, nondistended,  BS + x 4. MS: no deformity or atrophy. Skin: warm and dry, no rash.  Left chest incision dry intact well approximated no drainage Neuro:  Strength and sensation are intact. Psych: Normal affect.  Accessory Clinical Findings    Recent Labs: 06/27/2020: ALT 16; BUN 25; Creatinine, Ser 1.16; Hemoglobin 11.3; Platelets 184; Potassium 5.3; Sodium 135; TSH 0.788   Recent Lipid Panel    Component Value Date/Time   CHOL 124 06/27/2020 1143   TRIG 180 (H) 06/27/2020 1143   HDL 39 (L) 06/27/2020 1143   CHOLHDL 3.2 06/27/2020 1143   CHOLHDL 3.5 01/21/2016 0849   VLDL 15 01/21/2016 0849   LDLCALC 55 06/27/2020 1143    ECG personally reviewed by me today-none today.  Echocardiogram 02/04/2016 Study Conclusions   - Left ventricle: The cavity size was normal. Wall thickness was  normal. Systolic function was severely reduced. The estimated  ejection fraction was in the range of 20% to 25%. Diffuse  hypokinesis. Septal-lateral dyssynchrony. Indeterminant diastolic  function.  - Aortic valve: There was no stenosis.  - Mitral valve: There was mild regurgitation.  - Left atrium: The atrium was moderately to severely dilated.  - Right ventricle: The cavity size was normal. Systolic function  was mildly reduced.  - Right atrium: The atrium was moderately dilated.  - Tricuspid valve: There was moderate regurgitation. Peak RV-RA  gradient (S): 47 mm Hg.   - Pulmonary arteries: PA peak pressure: 50 mm Hg (S).  - Inferior vena cava: The vessel was normal in size. The  respirophasic diameter changes were in the normal range (>= 50%),  consistent with normal central venous pressure.  Assessment & Plan   1.  Ischemic cardiomyopathy-euvolemic today.  No increased DOE or activity tolerance.  Ejection fraction 20-25% on 5/17 echocardiogram.  Underwent ICD implantation 05/26/2017 while in [place] and now follows with EP. Continue carvedilol, Entresto Heart healthy low-sodium diet-salty 6 given Increase physical activity as tolerated  Coronary artery disease-no chest pain today.  Underwent cardiac catheterization with DES x2 in [place] 2/17 Continue aspirin, Brilinta, atorvastatin Heart healthy low-sodium diet-salty 6 given Increase physical activity as tolerated  Hyperlipidemia-06/27/2020: Cholesterol, Total 124; HDL 39; LDL Chol Calc (NIH) 55; Triglycerides 180 Continue atorvastatin Heart healthy low-sodium high-fiber diet Increase physical activity as tolerated  Hand numbness-reports she said bilateral hand numbness for years.  Her daughter reports that she is a very avid rider. May use PT hand putty to do squeezing exercises 2 times per day for 5-10 minutes Follow-up with PCP or orthopedics if no improvement  Disposition: Follow-up with Dr. Gwenlyn Found in 6 months.  Jossie Ng. Siniyah Evangelist NP-C    12/25/2020, 11:56 AM [address] Office [phone] Fax [phone]  Notice: This dictation was prepared with Dragon dictation along with smaller phrase technology. Any transcriptional errors that result from this process are unintentional and may not be corrected upon review.  I spent 12 minutes examining this patient, reviewing medications, and using patient centered shared decision making involving her cardiac care.  Prior to her visit I spent greater than 20 minutes reviewing her past  medical history,  medications, and prior cardiac tests.

## 2020-12-25 ENCOUNTER — Other Ambulatory Visit: Payer: Self-pay

## 2020-12-25 ENCOUNTER — Encounter: Payer: Self-pay | Admitting: General Practice

## 2020-12-25 ENCOUNTER — Ambulatory Visit (INDEPENDENT_AMBULATORY_CARE_PROVIDER_SITE_OTHER): Payer: Medicare Other | Admitting: General Practice

## 2020-12-25 VITALS — BP 94/52 | HR 75 | Ht 63.0 in | Wt 126.6 lb

## 2020-12-25 DIAGNOSIS — I2510 Atherosclerotic heart disease of native coronary artery without angina pectoris: Secondary | ICD-10-CM | POA: Diagnosis not present

## 2020-12-25 DIAGNOSIS — Z95810 Presence of automatic (implantable) cardiac defibrillator: Secondary | ICD-10-CM

## 2020-12-25 DIAGNOSIS — E7800 Pure hypercholesterolemia, unspecified: Secondary | ICD-10-CM

## 2020-12-25 DIAGNOSIS — Z9861 Coronary angioplasty status: Secondary | ICD-10-CM | POA: Diagnosis not present

## 2020-12-25 DIAGNOSIS — I255 Ischemic cardiomyopathy: Secondary | ICD-10-CM | POA: Diagnosis not present

## 2020-12-25 DIAGNOSIS — R200 Anesthesia of skin: Secondary | ICD-10-CM | POA: Diagnosis not present

## 2020-12-25 NOTE — Patient Instructions (Signed)
Medication Instructions:  The current medical regimen is effective;  continue present plan and medications as directed. Please refer to the Current Medication list given to you today.  *If you need a refill on your cardiac medications before your next appointment, please call your pharmacy*  Special Instructions TRY TO WALK MORE  PLEASE READ AND FOLLOW SALTY 6-ATTACHED-1,800mg  daily  MAKE SURE TO USE YOUR PUTTY FOR YOUR HANDS COUPLE TIMES DAILY AS ORDERED 10 MINUTES AT A TIME  OK TO TRAVEL MAKE SURE TO WEAR A MASK AND BRING YOUR MEDICATIONS  Follow-Up: Your next appointment:  6 month(s) In Person with You may see Quay Burow, MD OR IF UNAVAILABLE JESSE CLEAVER, FNP-C   Please call our office 2 months in advance to schedule this appointment   At [facility], you and your health needs are our priority.  As part of our continuing mission to provide you with exceptional heart care, we have created designated Provider Care Teams.  These Care Teams include your primary Cardiologist (physician) and Advanced Practice Providers (APPs -  Physician Assistants and Nurse Practitioners) who all work together to provide you with the care you need, when you need it.  We recommend signing up for the patient portal called "MyChart".  Sign up information is provided on this After Visit Summary.  MyChart is used to connect with patients for Virtual Visits (Telemedicine).  Patients are able to view lab/test results, encounter notes, upcoming appointments, etc.  Non-urgent messages can be sent to your provider as well.   To learn more about what you can do with MyChart, go to [email or web address].              6 SALTY THINGS TO AVOID     1,800MG  DAILY

## 2020-12-30 DIAGNOSIS — Z20822 Contact with and (suspected) exposure to covid-19: Secondary | ICD-10-CM | POA: Diagnosis not present

## 2021-03-12 ENCOUNTER — Encounter: Payer: Self-pay | Admitting: Podiatry

## 2021-03-12 ENCOUNTER — Ambulatory Visit (INDEPENDENT_AMBULATORY_CARE_PROVIDER_SITE_OTHER): Payer: Medicare Other | Admitting: Podiatry

## 2021-03-12 DIAGNOSIS — B351 Tinea unguium: Secondary | ICD-10-CM

## 2021-03-12 DIAGNOSIS — M79675 Pain in left toe(s): Secondary | ICD-10-CM

## 2021-03-12 DIAGNOSIS — M79674 Pain in right toe(s): Secondary | ICD-10-CM | POA: Diagnosis not present

## 2021-03-12 DIAGNOSIS — E1140 Type 2 diabetes mellitus with diabetic neuropathy, unspecified: Secondary | ICD-10-CM

## 2021-03-12 DIAGNOSIS — IMO0002 Reserved for concepts with insufficient information to code with codable children: Secondary | ICD-10-CM

## 2021-03-12 DIAGNOSIS — M2010 Hallux valgus (acquired), unspecified foot: Secondary | ICD-10-CM | POA: Diagnosis not present

## 2021-03-12 DIAGNOSIS — E1165 Type 2 diabetes mellitus with hyperglycemia: Secondary | ICD-10-CM

## 2021-03-12 NOTE — Progress Notes (Signed)
This patient returns to my office for at risk foot care.  This patient requires this care by a professional since this patient will be at risk due to having diabetes and renal insufficiency and neuropathy. This patient is unable to cut nails himself since the patient cannot reach his nails.These nails are painful walking and wearing shoes.  Patient says she has pai in her right foot and points to her bunion.  She is accompanied by her daughter and interpreter.  This patient presents for at risk foot care today.  General Appearance  Alert, conversant and in no acute stress.  Vascular  Dorsalis pedis are palpable  bilaterally. Posterior tibial pulses are absent  B/L. Capillary return is within normal limits  bilaterally. Cold feet  Bilaterally.  Absent hair digitally.  Neurologic  Senn-Weinstein monofilament wire test diminished bilaterally. Muscle power within normal limits bilaterally.  Nails Thick disfigured discolored nails with subungual debris  from hallux to fifth toes bilaterally. No evidence of bacterial infection or drainage bilaterally.  Orthopedic  No limitations of motion  feet .  No crepitus or effusions noted.  No bony pathology or digital deformities noted. HAV  B/L.  Skin  normotropic skin with no porokeratosis noted bilaterally.  No signs of infections or ulcers noted.     Onychomycosis  Pain in right toes  Pain in left toes  HAV  B/L.  Consent was obtained for treatment procedures.   Mechanical debridement of nails 1-5  bilaterally performed with a nail nipper.  Filed with dremel without incident. Patient qualifies for diabetic shoes due to DPN and HAV  B/L.   Return office visit  3 months.                   Told patient to return for periodic foot care and evaluation due to potential at risk complications.   Gardiner Barefoot DPM

## 2021-04-07 ENCOUNTER — Telehealth: Payer: Self-pay | Admitting: Family Medicine

## 2021-04-07 NOTE — Telephone Encounter (Signed)
E-scribed refill.  Per 12/05/20 OV note, plz schedule wellness (06/07/21), lab and cpe visits.

## 2021-04-08 ENCOUNTER — Other Ambulatory Visit: Payer: Self-pay

## 2021-04-08 ENCOUNTER — Telehealth: Payer: Self-pay

## 2021-04-08 NOTE — Telephone Encounter (Signed)
Left voice message to call th office

## 2021-04-08 NOTE — Telephone Encounter (Signed)
Carvedilol Last filled: per pharmacy[place]21 Last OV:  12/05/20, DM f/u Next OV:  06/15/21, AWV prt 2

## 2021-04-08 NOTE — Telephone Encounter (Signed)
Pt has MCR cpe on 06/15/21.  Plz schedule wellness and lab visits before this date.

## 2021-04-09 MED ORDER — CARVEDILOL 25 MG PO TABS
25.0000 mg | ORAL_TABLET | Freq: Two times a day (BID) | ORAL | 1 refills | Status: DC
Start: 2021-04-09 — End: 2021-06-15

## 2021-04-09 NOTE — Telephone Encounter (Signed)
Noted  

## 2021-04-09 NOTE — Telephone Encounter (Signed)
ERx 

## 2021-04-09 NOTE — Telephone Encounter (Signed)
Spoke with patient's daughter scheduled MWV,CPE with labs

## 2021-04-10 NOTE — Telephone Encounter (Signed)
Scheduled appointments

## 2021-04-10 NOTE — Telephone Encounter (Signed)
Noted  

## 2021-04-16 ENCOUNTER — Ambulatory Visit (INDEPENDENT_AMBULATORY_CARE_PROVIDER_SITE_OTHER): Payer: Medicare Other

## 2021-04-16 DIAGNOSIS — I255 Ischemic cardiomyopathy: Secondary | ICD-10-CM | POA: Diagnosis not present

## 2021-04-17 LAB — CUP PACEART REMOTE DEVICE CHECK
Battery Remaining Longevity: 54 mo
Battery Remaining Percentage: 58 %
Battery Voltage: 2.96 V
Brady Statistic AP VP Percent: 5.1 %
Brady Statistic AP VS Percent: 1 %
Brady Statistic AS VP Percent: 91 %
Brady Statistic AS VS Percent: 3.1 %
Brady Statistic RA Percent Paced: 5.2 %
Date Time Interrogation Session: 20220728020015
HighPow Impedance: 84 Ohm
HighPow Impedance: 84 Ohm
Implantable Lead Implant Date: 20180906
Implantable Lead Implant Date: 20180906
Implantable Lead Implant Date: 20180906
Implantable Lead Location: 753858
Implantable Lead Location: 753859
Implantable Lead Location: 753860
Implantable Pulse Generator Implant Date: 20180906
Lead Channel Impedance Value: 1725 Ohm
Lead Channel Impedance Value: 460 Ohm
Lead Channel Impedance Value: 560 Ohm
Lead Channel Pacing Threshold Amplitude: 0.625 V
Lead Channel Pacing Threshold Amplitude: 0.75 V
Lead Channel Pacing Threshold Amplitude: 1.5 V
Lead Channel Pacing Threshold Pulse Width: 0.5 ms
Lead Channel Pacing Threshold Pulse Width: 0.5 ms
Lead Channel Pacing Threshold Pulse Width: 0.5 ms
Lead Channel Sensing Intrinsic Amplitude: 1 mV
Lead Channel Sensing Intrinsic Amplitude: 11.9 mV
Lead Channel Setting Pacing Amplitude: 1.75 V
Lead Channel Setting Pacing Amplitude: 2 V
Lead Channel Setting Pacing Amplitude: 2.5 V
Lead Channel Setting Pacing Pulse Width: 0.5 ms
Lead Channel Setting Pacing Pulse Width: 0.5 ms
Lead Channel Setting Sensing Sensitivity: 0.5 mV
Pulse Gen Serial Number: 9776461

## 2021-05-13 NOTE — Progress Notes (Signed)
Remote ICD transmission.   

## 2021-06-03 ENCOUNTER — Ambulatory Visit (INDEPENDENT_AMBULATORY_CARE_PROVIDER_SITE_OTHER): Payer: Medicare Other

## 2021-06-03 ENCOUNTER — Other Ambulatory Visit: Payer: Self-pay

## 2021-06-03 DIAGNOSIS — E1165 Type 2 diabetes mellitus with hyperglycemia: Secondary | ICD-10-CM

## 2021-06-03 DIAGNOSIS — E1140 Type 2 diabetes mellitus with diabetic neuropathy, unspecified: Secondary | ICD-10-CM

## 2021-06-03 DIAGNOSIS — IMO0002 Reserved for concepts with insufficient information to code with codable children: Secondary | ICD-10-CM

## 2021-06-03 DIAGNOSIS — M2010 Hallux valgus (acquired), unspecified foot: Secondary | ICD-10-CM

## 2021-06-03 NOTE — Progress Notes (Signed)
Patient presents to the office today for diabetic shoe and insole measuring.  Patient was measured with brannock device to determine size and width for 1 pair of extra depth shoes and foam casted for 3 pair of insoles.   Documentation of medical necessity will be sent to patient's treating diabetic doctor to verify and sign.   Patient's diabetic provider: Dr. Shella Spearing [facility] and insoles will be ordered at that time and patient will be notified for an appointment for fitting when they arrive.   Shoe size (per patient): 8 medium   Brannock measurement: rt: 7.5 medium, lt: 7 medium  Patient shoe selection-   1st choice:   NB [id number]  2nd choice:  [facility] [address]  Shoe size ordered: 8 medium

## 2021-06-06 ENCOUNTER — Other Ambulatory Visit: Payer: Self-pay | Admitting: Family Medicine

## 2021-06-06 DIAGNOSIS — E1169 Type 2 diabetes mellitus with other specified complication: Secondary | ICD-10-CM

## 2021-06-06 DIAGNOSIS — D509 Iron deficiency anemia, unspecified: Secondary | ICD-10-CM

## 2021-06-06 DIAGNOSIS — IMO0002 Reserved for concepts with insufficient information to code with codable children: Secondary | ICD-10-CM

## 2021-06-06 DIAGNOSIS — E785 Hyperlipidemia, unspecified: Secondary | ICD-10-CM

## 2021-06-06 DIAGNOSIS — E1140 Type 2 diabetes mellitus with diabetic neuropathy, unspecified: Secondary | ICD-10-CM

## 2021-06-08 ENCOUNTER — Other Ambulatory Visit: Payer: Self-pay

## 2021-06-08 ENCOUNTER — Other Ambulatory Visit (INDEPENDENT_AMBULATORY_CARE_PROVIDER_SITE_OTHER): Payer: Medicare Other

## 2021-06-08 DIAGNOSIS — E1140 Type 2 diabetes mellitus with diabetic neuropathy, unspecified: Secondary | ICD-10-CM | POA: Diagnosis not present

## 2021-06-08 DIAGNOSIS — D509 Iron deficiency anemia, unspecified: Secondary | ICD-10-CM

## 2021-06-08 DIAGNOSIS — IMO0002 Reserved for concepts with insufficient information to code with codable children: Secondary | ICD-10-CM

## 2021-06-08 DIAGNOSIS — E1169 Type 2 diabetes mellitus with other specified complication: Secondary | ICD-10-CM

## 2021-06-08 DIAGNOSIS — E1165 Type 2 diabetes mellitus with hyperglycemia: Secondary | ICD-10-CM

## 2021-06-08 DIAGNOSIS — E785 Hyperlipidemia, unspecified: Secondary | ICD-10-CM | POA: Diagnosis not present

## 2021-06-08 LAB — COMPREHENSIVE METABOLIC PANEL
ALT: 16 U/L (ref 0–35)
AST: 17 U/L (ref 0–37)
Albumin: 3.9 g/dL (ref 3.5–5.2)
Alkaline Phosphatase: 78 U/L (ref 39–117)
BUN: 26 mg/dL — ABNORMAL HIGH (ref 6–23)
CO2: 25 mEq/L (ref 19–32)
Calcium: 9.3 mg/dL (ref 8.4–10.5)
Chloride: 105 mEq/L (ref 96–112)
Creatinine, Ser: 1.18 mg/dL (ref 0.40–1.20)
GFR: 42.38 mL/min — ABNORMAL LOW (ref >60.00)
Glucose, Bld: 161 mg/dL — ABNORMAL HIGH (ref 70–99)
Potassium: 4.2 mEq/L (ref 3.5–5.1)
Sodium: 139 mEq/L (ref 135–145)
Total Bilirubin: 0.5 mg/dL (ref 0.2–1.2)
Total Protein: 7.7 g/dL (ref 6.0–8.3)

## 2021-06-08 LAB — CBC WITH DIFFERENTIAL/PLATELET
Basophils Absolute: 0 10*3/uL (ref 0.0–0.1)
Basophils Relative: 0.6 % (ref 0.0–3.0)
Eosinophils Absolute: 0.3 10*3/uL (ref 0.0–0.7)
Eosinophils Relative: 3.9 % (ref 0.0–5.0)
HCT: 31.9 % — ABNORMAL LOW (ref 36.0–46.0)
Hemoglobin: 10.6 g/dL — ABNORMAL LOW (ref 12.0–15.0)
Lymphocytes Relative: 28.9 % (ref 12.0–46.0)
Lymphs Abs: 1.9 10*3/uL (ref 0.7–4.0)
MCHC: 33.4 g/dL (ref 30.0–36.0)
MCV: 89 fl (ref 78.0–100.0)
Monocytes Absolute: 0.6 10*3/uL (ref 0.1–1.0)
Monocytes Relative: 9.4 % (ref 3.0–12.0)
Neutro Abs: 3.9 10*3/uL (ref 1.4–7.7)
Neutrophils Relative %: 57.2 % (ref 43.0–77.0)
Platelets: 166 10*3/uL (ref 150.0–400.0)
RBC: 3.58 Mil/uL — ABNORMAL LOW (ref 3.87–5.11)
RDW: 14.3 % (ref 11.5–15.5)
WBC: 6.7 10*3/uL (ref 4.0–10.5)

## 2021-06-08 LAB — LIPID PANEL
Cholesterol: 120 mg/dL (ref 0–200)
HDL: 38.9 mg/dL — ABNORMAL LOW (ref >39.00)
LDL Cholesterol: 46 mg/dL (ref 0–99)
NonHDL: 81.59
Total CHOL/HDL Ratio: 3
Triglycerides: 177 mg/dL — ABNORMAL HIGH (ref 0.0–149.0)
VLDL: 35.4 mg/dL (ref 0.0–40.0)

## 2021-06-08 LAB — IBC PANEL
Iron: 50 ug/dL (ref 42–145)
Saturation Ratios: 15.6 % — ABNORMAL LOW (ref 20.0–50.0)
TIBC: 320.6 ug/dL (ref 250.0–450.0)
Transferrin: 229 mg/dL (ref 212.0–360.0)

## 2021-06-08 LAB — FERRITIN: Ferritin: 31.5 ng/mL (ref 10.0–291.0)

## 2021-06-08 LAB — HEMOGLOBIN A1C: Hgb A1c MFr Bld: 7.7 % — ABNORMAL HIGH (ref 4.6–6.5)

## 2021-06-11 ENCOUNTER — Ambulatory Visit: Payer: Medicare Other

## 2021-06-12 ENCOUNTER — Ambulatory Visit: Payer: Medicare Other | Admitting: Family Medicine

## 2021-06-15 ENCOUNTER — Encounter: Payer: Self-pay | Admitting: Family Medicine

## 2021-06-15 ENCOUNTER — Ambulatory Visit (INDEPENDENT_AMBULATORY_CARE_PROVIDER_SITE_OTHER): Payer: Medicare Other | Admitting: Family Medicine

## 2021-06-15 ENCOUNTER — Other Ambulatory Visit: Payer: Self-pay

## 2021-06-15 VITALS — BP 128/62 | HR 65 | Temp 97.5°F | Ht 61.75 in | Wt 128.0 lb

## 2021-06-15 DIAGNOSIS — D509 Iron deficiency anemia, unspecified: Secondary | ICD-10-CM

## 2021-06-15 DIAGNOSIS — Z95810 Presence of automatic (implantable) cardiac defibrillator: Secondary | ICD-10-CM

## 2021-06-15 DIAGNOSIS — I255 Ischemic cardiomyopathy: Secondary | ICD-10-CM

## 2021-06-15 DIAGNOSIS — Z23 Encounter for immunization: Secondary | ICD-10-CM | POA: Diagnosis not present

## 2021-06-15 DIAGNOSIS — R413 Other amnesia: Secondary | ICD-10-CM

## 2021-06-15 DIAGNOSIS — M79675 Pain in left toe(s): Secondary | ICD-10-CM

## 2021-06-15 DIAGNOSIS — E1169 Type 2 diabetes mellitus with other specified complication: Secondary | ICD-10-CM

## 2021-06-15 DIAGNOSIS — I5022 Chronic systolic (congestive) heart failure: Secondary | ICD-10-CM

## 2021-06-15 DIAGNOSIS — E1122 Type 2 diabetes mellitus with diabetic chronic kidney disease: Secondary | ICD-10-CM

## 2021-06-15 DIAGNOSIS — IMO0002 Reserved for concepts with insufficient information to code with codable children: Secondary | ICD-10-CM

## 2021-06-15 DIAGNOSIS — I110 Hypertensive heart disease with heart failure: Secondary | ICD-10-CM

## 2021-06-15 DIAGNOSIS — M79674 Pain in right toe(s): Secondary | ICD-10-CM

## 2021-06-15 DIAGNOSIS — E785 Hyperlipidemia, unspecified: Secondary | ICD-10-CM

## 2021-06-15 DIAGNOSIS — Z0000 Encounter for general adult medical examination without abnormal findings: Secondary | ICD-10-CM | POA: Diagnosis not present

## 2021-06-15 DIAGNOSIS — N1832 Chronic kidney disease, stage 3b: Secondary | ICD-10-CM

## 2021-06-15 DIAGNOSIS — G6289 Other specified polyneuropathies: Secondary | ICD-10-CM

## 2021-06-15 DIAGNOSIS — Z7189 Other specified counseling: Secondary | ICD-10-CM

## 2021-06-15 DIAGNOSIS — N183 Chronic kidney disease, stage 3 unspecified: Secondary | ICD-10-CM

## 2021-06-15 DIAGNOSIS — F4321 Adjustment disorder with depressed mood: Secondary | ICD-10-CM

## 2021-06-15 MED ORDER — SERTRALINE HCL 25 MG PO TABS: 25.0000 mg | ORAL_TABLET | Freq: Every day | ORAL | 3 refills | Status: DC

## 2021-06-15 MED ORDER — CARVEDILOL 25 MG PO TABS: 25.0000 mg | ORAL_TABLET | Freq: Two times a day (BID) | ORAL | 3 refills | Status: DC

## 2021-06-15 MED ORDER — ATORVASTATIN CALCIUM 40 MG PO TABS: 40.0000 mg | ORAL_TABLET | Freq: Every day | ORAL | 3 refills | Status: DC

## 2021-06-15 MED ORDER — ALPHA-LIPOIC ACID 600 MG PO CAPS: 1.0000 | ORAL_CAPSULE | Freq: Every day | ORAL | Status: DC

## 2021-06-15 NOTE — Progress Notes (Signed)
Patient ID: April Short, female    DOB: 11-17-1934, 86 y.o.   MRN: [id number]  This visit was conducted in person.  BP 128/62   Pulse 65   Temp (!) 97.5 F (36.4 C) (Temporal)   Ht 5' 1.75" (1.568 m)   Wt 128 lb (58.1 kg)   SpO2 95%   BMI 23.60 kg/m    CC: AMW/CPE Subjective:   HPI: April Short is a 86 y.o. female presenting on 06/15/2021 for Medicare Wellness (Provided notes from home visit.  Pt accompanied by daughter, April Short- temp 98.2.)   Here with daughter April Short who took care of her in [place] lives in [place].  Did not see health advisor this year.   Hearing Screening   500Hz  1000Hz  2000Hz  4000Hz   Right ear 40 40 40 0  Left ear 0 0 40 0   Vision Screening   Right eye Left eye Both eyes  Without correction 20/50 20/70 20/50   With correction     Denies hearing difficulty, declines audiology evaluation.  Encouraged eye exam.  [place] Office Visit from 06/15/2021 in [place] [facility]  PHQ-2 Total Score 2       Fall Risk  06/15/2021 01/14/2016  Falls in the past year? 0 No    Established with cardiology Dr Gwenlyn Found, last saw 12/2020. Also sees EP. St Jude ICD placed 05/2017.  Sees podiatry regularly.  H/o diabetes since 86 yo, cervical cancer, colon cancer.  Neuropathy since 2014. Started in hands, now progressing into feet.  Daughters note significant benefit in mood since starting sertraline 25mg . They don't want pt to know that she is on this medication.   Preventative: H/o colon cancer last colonoscopy 2017.  Breast cancer screening - none in 5+ yrs. She does breast exams at home. Declines mammo.  Well woman exam - H/o cervical cancer 2005 s/p total hysterectomy w/BSO.  DEXA scan - asked to look for records at home.  Lung cancer screening - not eligible Flu shot - yearly COVID shot - Moderna 09/2019, 10/2019, booster 08/2020 Tetanus shot - ~2010 Pneumonia shot - unsure Shingrix - discussed  Advanced directive discussion - would want April Short  to be HCPOA. Does not want resuscitation, intubation, dialysis. Packet provided today.  Seat belt use discussed Sunscreen use discussed. No changing moles on skin.  Non smoker Alcohol - seldom. H/o alcohol use for 20 yrs  Dentist -  Eye exam - due Bowel - no constipation Bladder - no incontinence  Widow 2018, lives with daughter locally From [place], lived in [place] then [place].  Moved to [facility] 2021 to be closer to family  Occ: retired Activity:  Diet:  Daughter April Short who lives in [place] is [place]      Relevant past medical, surgical, family and social history reviewed and updated as indicated. Interim medical history since our last visit reviewed. Allergies and medications reviewed and updated. Outpatient Medications Prior to Visit  Medication Sig Dispense Refill   aspirin EC 81 MG tablet Take 81 mg by mouth daily. Swallow whole.     sacubitril-valsartan (ENTRESTO) 49-51 MG Take 1 tablet by mouth 2 (two) times daily. 60 tablet 12   ticagrelor (BRILINTA) 60 MG TABS tablet Take 1 tablet (60 mg total) by mouth 2 (two) times daily. 60 tablet 12   atorvastatin (LIPITOR) 40 MG tablet TAKE 1 TABLET BY MOUTH EVERY DAY 90 tablet 0   carvedilol (COREG) 25 MG tablet Take 1 tablet (25 mg total) by mouth  2 (two) times daily with a meal. 180 tablet 1   sertraline (ZOLOFT) 25 MG tablet Take 1 tablet (25 mg total) by mouth daily. 30 tablet 6   No facility-administered medications prior to visit.     Per HPI unless specifically indicated in ROS section below Review of Systems  Constitutional:  Negative for activity change, appetite change, chills, fatigue, fever and unexpected weight change.  HENT:  Negative for hearing loss.   Eyes:  Negative for visual disturbance.  Respiratory:  Negative for cough, chest tightness, shortness of breath and wheezing.   Cardiovascular:  Negative for chest pain, palpitations and leg swelling.  Gastrointestinal:  Negative for abdominal distention, abdominal pain, blood in  stool, constipation, diarrhea, nausea and vomiting.  Genitourinary:  Negative for difficulty urinating and hematuria.  Musculoskeletal:  Negative for arthralgias, myalgias and neck pain.  Skin:  Negative for rash.  Neurological:  Negative for dizziness, seizures, syncope and headaches.  Hematological:  Negative for adenopathy. Bruises/bleeds easily.  Psychiatric/Behavioral:  Negative for dysphoric mood. The patient is not nervous/anxious.    Objective:  BP 128/62   Pulse 65   Temp (!) 97.5 F (36.4 C) (Temporal)   Ht 5' 1.75" (1.568 m)   Wt 128 lb (58.1 kg)   SpO2 95%   BMI 23.60 kg/m   Wt Readings from Last 3 Encounters:  06/15/21 128 lb (58.1 kg)  12/25/20 126 lb 9.6 oz (57.4 kg)  12/05/20 126 lb 5 oz (57.3 kg)      Physical Exam Vitals and nursing note reviewed.  Constitutional:      Appearance: Normal appearance. She is not ill-appearing.  HENT:     Head: Normocephalic and atraumatic.     Right Ear: Tympanic membrane, ear canal and external ear normal. There is no impacted cerumen.     Left Ear: Tympanic membrane, ear canal and external ear normal. There is no impacted cerumen.  Eyes:     General:        Right eye: No discharge.        Left eye: No discharge.     Extraocular Movements: Extraocular movements intact.     Conjunctiva/sclera: Conjunctivae normal.     Pupils: Pupils are equal, round, and reactive to light.  Neck:     Thyroid: No thyroid mass or thyromegaly.     Vascular: No carotid bruit.  Cardiovascular:     Rate and Rhythm: Normal rate and regular rhythm.     Pulses: Normal pulses.     Heart sounds: Normal heart sounds. No murmur heard. Pulmonary:     Effort: Pulmonary effort is normal. No respiratory distress.     Breath sounds: Normal breath sounds. No wheezing, rhonchi or rales.  Abdominal:     General: Bowel sounds are normal. There is no distension.     Palpations: Abdomen is soft. There is no mass.     Tenderness: There is no abdominal  tenderness. There is no guarding or rebound.     Hernia: No hernia is present.  Musculoskeletal:     Cervical back: Normal range of motion and neck supple. No rigidity.     Right lower leg: No edema.     Left lower leg: No edema.  Lymphadenopathy:     Cervical: No cervical adenopathy.  Skin:    General: Skin is warm and dry.     Findings: No rash.  Neurological:     General: No focal deficit present.     Mental Status:  She is alert. Mental status is at baseline.     Comments:  Recall 0/3, 1/3 with cue  Calculation 3/5 ODUNM  Psychiatric:        Mood and Affect: Mood normal.        Behavior: Behavior normal.      Results for orders placed or performed in visit on 06/08/21  CBC with Differential/Platelet  Result Value Ref Range   WBC 6.7 4.0 - 10.5 K/uL   RBC 3.58 (L) 3.87 - 5.11 Mil/uL   Hemoglobin 10.6 (L) 12.0 - 15.0 g/dL   HCT 31.9 (L) 36.0 - 46.0 %   MCV 89.0 78.0 - 100.0 fl   MCHC 33.4 30.0 - 36.0 g/dL   RDW 14.3 11.5 - 15.5 %   Platelets 166.0 150.0 - 400.0 K/uL   Neutrophils Relative % 57.2 43.0 - 77.0 %   Lymphocytes Relative 28.9 12.0 - 46.0 %   Monocytes Relative 9.4 3.0 - 12.0 %   Eosinophils Relative 3.9 0.0 - 5.0 %   Basophils Relative 0.6 0.0 - 3.0 %   Neutro Abs 3.9 1.4 - 7.7 K/uL   Lymphs Abs 1.9 0.7 - 4.0 K/uL   Monocytes Absolute 0.6 0.1 - 1.0 K/uL   Eosinophils Absolute 0.3 0.0 - 0.7 K/uL   Basophils Absolute 0.0 0.0 - 0.1 K/uL  Hemoglobin A1c  Result Value Ref Range   Hgb A1c MFr Bld 7.7 (H) 4.6 - 6.5 %  Comprehensive metabolic panel  Result Value Ref Range   Sodium 139 135 - 145 mEq/L   Potassium 4.2 3.5 - 5.1 mEq/L   Chloride 105 96 - 112 mEq/L   CO2 25 19 - 32 mEq/L   Glucose, Bld 161 (H) 70 - 99 mg/dL   BUN 26 (H) 6 - 23 mg/dL   Creatinine, Ser 1.18 0.40 - 1.20 mg/dL   Total Bilirubin 0.5 0.2 - 1.2 mg/dL   Alkaline Phosphatase 78 39 - 117 U/L   AST 17 0 - 37 U/L   ALT 16 0 - 35 U/L   Total Protein 7.7 6.0 - 8.3 g/dL   Albumin 3.9 3.5  - 5.2 g/dL   GFR 42.38 (L) >60.00 mL/min   Calcium 9.3 8.4 - 10.5 mg/dL  Lipid panel  Result Value Ref Range   Cholesterol 120 0 - 200 mg/dL   Triglycerides 177.0 (H) 0.0 - 149.0 mg/dL   HDL 38.90 (L) >39.00 mg/dL   VLDL 35.4 0.0 - 40.0 mg/dL   LDL Cholesterol 46 0 - 99 mg/dL   Total CHOL/HDL Ratio 3    NonHDL 81.59   IBC panel  Result Value Ref Range   Iron 50 42 - 145 ug/dL   Transferrin 229.0 212.0 - 360.0 mg/dL   Saturation Ratios 15.6 (L) 20.0 - 50.0 %   TIBC 320.6 250.0 - 450.0 mcg/dL  Ferritin  Result Value Ref Range   Ferritin 31.5 10.0 - 291.0 ng/mL   Depression screen [facility] 2/9 06/15/2021 12/05/2020 08/05/2020 01/14/2016  Decreased Interest 0 2 2 3   Down, Depressed, Hopeless 2 1 1 3   PHQ - 2 Score 2 3 3 6   Altered sleeping 0 1 2 3   Tired, decreased energy 1 1 0 3  Change in appetite 2 1 2 3   Feeling bad or failure about yourself  1 1 0 1  Trouble concentrating 0 1 1 0  Moving slowly or fidgety/restless 0 3 2 2   Suicidal thoughts 0 0 0 3  PHQ-9 Score 6 11  10 21  Difficult doing work/chores - - Somewhat difficult Somewhat difficult    GAD 7 : Generalized Anxiety Score 06/15/2021 12/05/2020 08/05/2020  Nervous, Anxious, on Edge 2 1 0  Control/stop worrying 2 1 0  Worry too much - different things 0 0 2  Trouble relaxing 2 0 1  Restless 1 0 0  Easily annoyed or irritable 2 2 2   Afraid - awful might happen 2 2 2   Total GAD 7 Score 11 6 7    Assessment & Plan:  This visit occurred during the SARS-CoV-2 public health emergency.  Safety protocols were in place, including screening questions prior to the visit, additional usage of staff PPE, and extensive cleaning of exam room while observing appropriate contact time as indicated for disinfecting solutions.   Problem List Items Addressed This Visit     Medicare annual wellness visit, subsequent - Primary (Chronic)    I have personally reviewed the Medicare Annual Wellness questionnaire and have noted 1. The patient's  medical and social history 2. Their use of alcohol, tobacco or illicit drugs 3. Their current medications and supplements 4. The patient's functional ability including ADL's, fall risks, home safety risks and hearing or visual impairment. Cognitive function has been assessed and addressed as indicated.  5. Diet and physical activity 6. Evidence for depression or mood disorders The patients weight, height, BMI have been recorded in the chart. I have made referrals, counseling and provided education to the patient based on review of the above and I have provided the pt with a written personalized care plan for preventive services. Provider list updated.. See scanned questionairre as needed for further documentation. Reviewed preventative protocols and updated unless pt declined.       Health maintenance examination (Chronic)    Preventative protocols reviewed and updated unless pt declined. Discussed healthy diet and lifestyle.       Advanced directives, counseling/discussion (Chronic)    Advanced directive discussion - would want April Short to be HCPOA. Does not want resuscitation, intubation, dialysis. Packet provided today.       Type 2 diabetes mellitus, uncontrolled, with neuropathy (HCC)    Chronic, currently diet controlled. Adequate control. Discussed goal A1c ideally <7% if able to tolerate well. Has previously declined medication. RTC 6 mo DM f/u visit.       Relevant Medications   atorvastatin (LIPITOR) 40 MG tablet   Peripheral neuropathy    Interestingly, her neuropathy started in hands and is now spreading into feet. Presumed diabetic neuropathy. Suggested trial of OTC alpha lipoic acid 600mg  daily, update with effect. No burning neuropathic pain so don't think would benefit from gabapentin/lyrica.       Relevant Medications   sertraline (ZOLOFT) 25 MG tablet   Iron deficiency anemia    Maintaining levels off iron replacement.       Ischemic cardiomyopathy   Relevant  Medications   atorvastatin (LIPITOR) 40 MG tablet   carvedilol (COREG) 25 MG tablet   Hypertensive heart disease    Continue entresto and carvedilol.       Relevant Medications   atorvastatin (LIPITOR) 40 MG tablet   carvedilol (COREG) 25 MG tablet   Hyperlipidemia associated with type 2 diabetes mellitus (HCC)    Chronic, stable period on atorvastatin, with LDL at goal. The ASCVD Risk score (Arnett DK, et al., 2019) failed to calculate for the following reasons:   The 2019 ASCVD risk score is only valid for ages 97 to 49  Relevant Medications   atorvastatin (LIPITOR) 40 MG tablet   carvedilol (COREG) 25 MG tablet   Chronic systolic CHF (congestive heart failure) ([place])    Followed by cards and EP, on brilinta, aspirin, entresto and BB.       Relevant Medications   atorvastatin (LIPITOR) 40 MG tablet   carvedilol (COREG) 25 MG tablet   Pain due to onychomycosis of toenails of both feet    Regularly sees podiatry.       ICD (implantable cardioverter-defibrillator) in place   Adjustment disorder with depressed mood    Continues low dose sertraline 25mg  daily - family notes significant improvement since starting antidepressant - decreased crying, happier mood - however they haven't told patient what this medicine is used for as they don't think she would take it.       Memory difficulty    Difficulty with recall and calculation noted on limited testing today. Consider formal memory evaluation next visit.       CKD stage 3 due to type 2 diabetes mellitus (HCC)    GFR seems stable in 40s. Continue to monitor.       Relevant Medications   atorvastatin (LIPITOR) 40 MG tablet   Anemia    Chronic normocytic anemia, anticipate due to CKD. Will continue to monitor.       Other Visit Diagnoses     Need for influenza vaccination       Relevant Orders   Flu Vaccine QUAD High Dose(Fluad) (Completed)        Meds ordered this encounter  Medications   atorvastatin  (LIPITOR) 40 MG tablet    Sig: Take 1 tablet (40 mg total) by mouth daily.    Dispense:  90 tablet    Refill:  3   carvedilol (COREG) 25 MG tablet    Sig: Take 1 tablet (25 mg total) by mouth 2 (two) times daily with a meal.    Dispense:  180 tablet    Refill:  3   sertraline (ZOLOFT) 25 MG tablet    Sig: Take 1 tablet (25 mg total) by mouth daily.    Dispense:  90 tablet    Refill:  3   Alpha-Lipoic Acid 600 MG CAPS    Sig: Take 1 capsule (600 mg total) by mouth daily.   Orders Placed This Encounter  Procedures   Flu Vaccine QUAD High Dose(Fluad)    Patient instructions: Flu shot today  Haga cita para examen de vision (puede tratar [facility] junto a [place]).  Traigame copia de colonoscopia y densitometria si tiene en casa.  Busque si tiene records de vacunas en casa o con su previo doctor.  Revise con farmacista sobre vacuna contra shingles (shingrix).  Trate alpha lipoic acid 600mg  diarios para neuropatia diabetica.  Regresar en 6 meses para proximo examen fisico  Follow up plan: No follow-ups on file.  Ria Bush, MD

## 2021-06-15 NOTE — Patient Instructions (Addendum)
Flu shot today  Haga cita para examen de vision (puede tratar [facility] junto a [place]).  Traigame copia de colonoscopia y densitometria si tiene en casa.  Busque si tiene records de vacunas en casa o con su previo doctor.  Revise con farmacista sobre vacuna contra shingles (shingrix).  Trate alpha lipoic acid 600mg  diarios para neuropatia diabetica.  Regresar en 6 meses para proximo examen fisico  Mantenimiento de la salud despus de los 46 aos de edad Health Maintenance After Age 62 Despus de los 65 aos de edad, corre un riesgo mayor de Tourist information centre manager enfermedades e infecciones a Barrister's clerk, como tambin de sufrir lesiones por cadas. Las cadas son la causa principal de las fracturas de huesos y lesiones en la cabeza de personas mayores de 36 aos de edad. Recibir cuidados preventivos de forma regular puede ayudarlo a mantenerse saludable y en buen [place]. Los cuidados preventivos incluyen realizarse anlisis de forma regular y Actor en el estilo de vida segn las recomendaciones del mdico. Converse con el mdico sobre lo siguiente: Las pruebas de deteccin y los anlisis que debe Dispensing optician. Una prueba de deteccin es un estudio que se para Hydrographic surveyor la presencia de una enfermedad cuando no tiene sntomas. Un plan de dieta y ejercicios adecuado para usted. Qu debo saber sobre las pruebas de deteccin y los anlisis para prevenir cadas? Realizarse pruebas de deteccin y [employer] es la mejor manera de Hydrographic surveyor un problema de salud de forma temprana. El diagnstico y tratamiento tempranos le brindan la mejor oportunidad de Chief Technology Officer las afecciones mdicas que son comunes despus de los 105 aos de edad. Ciertas afecciones y elecciones de estilo de vida pueden hacer que sea ms propenso a sufrir Engineer, manufacturing. El mdico puede recomendarle lo siguiente: Controles regulares de la visin. Una visin deficiente y afecciones como las cataratas pueden hacer que sea ms  propenso a sufrir Engineer, manufacturing. Si [place] lentes, asegrese de obtener una receta actualizada si su visin cambia. Revisin de medicamentos. Revise regularmente con el mdico todos los medicamentos que toma, incluidos los medicamentos de [place]. Consulte al [employer] efectos secundarios que pueden hacer que sea ms propenso a sufrir Engineer, manufacturing. Informe al mdico si alguno de los medicamentos que toma lo hace sentir mareado o somnoliento. Pruebas de deteccin para la osteoporosis. La osteoporosis es una afeccin que hace que los huesos se vuelvan ms frgiles. En consecuencia, los huesos pueden debilitarse y quebrarse ms fcilmente. Pruebas de deteccin para la presin arterial. Los cambios en la presin arterial y los medicamentos para Chief Technology Officer la presin arterial pueden hacerlo sentir mareado. Controles de fuerza y equilibrio. El mdico puede recomendar ciertos estudios para controlar su fuerza y equilibrio al estar de pie, al caminar o al cambiar de posicin. Examen de los pies. El dolor y Chiropractor en los pies, como tambin no utilizar el calzado [place], pueden hacer que sea ms propenso a sufrir Engineer, manufacturing. Prueba de deteccin de la depresin. Es ms probable que sufra una cada si tiene miedo a caerse, se siente mal emocionalmente o se siente incapaz de Patent examiner. Prueba de deteccin de consumo de alcohol. Beber demasiado alcohol puede afectar su equilibrio y puede hacer que sea ms propenso a sufrir Engineer, manufacturing. Qu medidas puedo tomar para reducir mi riesgo de sufrir una cada? Instrucciones generales Hable con el mdico sobre sus riesgos de sufrir una cada. Infrmele al mdico si: Se cae. Asegrese de informarle a su  mdico acerca de todas las cadas, incluso aquellas que parecen ser [employer] Se siente mareado, somnoliento o que pierde el equilibrio. Use los medicamentos de venta libre y los recetados solamente como se lo haya indicado el mdico. Estos  incluyen todos los suplementos. Siga una dieta sana y [place] un peso saludable. Una dieta saludable incluye productos lcteos descremados, carnes bajas en contenido de grasa ([place]), fibra de granos enteros, frijoles y [place] frutas y verduras. Seguridad en el hogar Retire los objetos que puedan causar tropiezos tales como alfombras, cables u obstculos. Instale equipos de seguridad, como barras para sostn en los baos y barandas de seguridad en las escaleras. [place] habitaciones y los pasillos bien iluminados. Actividad  Siga un programa de ejercicio regular para mantenerse en forma. Esto lo ayudar a Contractor equilibrio. Consulte al mdico qu tipos de ejercicios son adecuados para usted. Si necesita un bastn o un andador, selo segn las recomendaciones del mdico. Utilice calzado con buen apoyo y suela antideslizante. Estilo de vida No beba alcohol si el mdico se lo prohbe. Si bebe alcohol, limite la cantidad que consume: De 0 a 1 medida por da para las mujeres. De 0 a 2 medidas por da para los hombres. Est atento a la cantidad de alcohol que hay en las bebidas que toma. En los [place], una medida equivale a una botella tpica de cerveza (12 oz/355 ml), medio vaso de vino (5 oz/148 ml) o un trago de bebidas alcohlicas de alta graduacin (1 oz/45 ml). No consuma ningn producto que contenga nicotina o tabaco, como cigarrillos y Psychologist, sport and exercise. Si necesita ayuda para dejar de consumir, consulte al [employer]. Resumen Tener un estilo de vida saludable y recibir cuidados preventivos pueden ayudar a Theatre stage manager salud y el bienestar despus de los 107 aos de [place]. Realizarse pruebas de deteccin y [employer] es la mejor manera de Hydrographic surveyor un problema de salud de forma temprana y April Short a Product/process development scientist una cada. El diagnstico y tratamiento tempranos le brindan la mejor oportunidad de Chief Technology Officer las afecciones mdicas ms comunes en las personas mayores de 60 aos de  edad. Las cadas son la causa principal de las fracturas de huesos y lesiones en la cabeza de personas mayores de 74 aos de edad. Tome precauciones para evitar una cada en su casa. Trabaje con el mdico para saber qu cambios que puede hacer para mejorar su salud y [place], y [place]. Esta informacin no tiene Marine scientist el consejo del mdico. Asegrese de hacerle al mdico cualquier pregunta que tenga. Document Revised: 09/16/2020 Document Reviewed: 09/16/2020 Elsevier Patient Education  2022 [employer].

## 2021-06-18 ENCOUNTER — Encounter: Payer: Self-pay | Admitting: Podiatry

## 2021-06-18 ENCOUNTER — Ambulatory Visit (INDEPENDENT_AMBULATORY_CARE_PROVIDER_SITE_OTHER): Payer: Medicare Other | Admitting: Podiatry

## 2021-06-18 ENCOUNTER — Other Ambulatory Visit: Payer: Self-pay

## 2021-06-18 DIAGNOSIS — N183 Chronic kidney disease, stage 3 unspecified: Secondary | ICD-10-CM | POA: Diagnosis not present

## 2021-06-18 DIAGNOSIS — M79675 Pain in left toe(s): Secondary | ICD-10-CM

## 2021-06-18 DIAGNOSIS — E1140 Type 2 diabetes mellitus with diabetic neuropathy, unspecified: Secondary | ICD-10-CM

## 2021-06-18 DIAGNOSIS — E1165 Type 2 diabetes mellitus with hyperglycemia: Secondary | ICD-10-CM | POA: Diagnosis not present

## 2021-06-18 DIAGNOSIS — Z0000 Encounter for general adult medical examination without abnormal findings: Secondary | ICD-10-CM | POA: Insufficient documentation

## 2021-06-18 DIAGNOSIS — D631 Anemia in chronic kidney disease: Secondary | ICD-10-CM | POA: Insufficient documentation

## 2021-06-18 DIAGNOSIS — D649 Anemia, unspecified: Secondary | ICD-10-CM | POA: Insufficient documentation

## 2021-06-18 DIAGNOSIS — Z7189 Other specified counseling: Secondary | ICD-10-CM | POA: Insufficient documentation

## 2021-06-18 DIAGNOSIS — B351 Tinea unguium: Secondary | ICD-10-CM

## 2021-06-18 DIAGNOSIS — M79674 Pain in right toe(s): Secondary | ICD-10-CM

## 2021-06-18 DIAGNOSIS — IMO0002 Reserved for concepts with insufficient information to code with codable children: Secondary | ICD-10-CM

## 2021-06-18 DIAGNOSIS — E1122 Type 2 diabetes mellitus with diabetic chronic kidney disease: Secondary | ICD-10-CM

## 2021-06-18 NOTE — Assessment & Plan Note (Signed)
Regularly sees podiatry.

## 2021-06-18 NOTE — Assessment & Plan Note (Signed)
Difficulty with recall and calculation noted on limited testing today. Consider formal memory evaluation next visit.

## 2021-06-18 NOTE — Progress Notes (Signed)
This patient returns to my office for at risk foot care.  This patient requires this care by a professional since this patient will be at risk due to having diabetes and renal insufficiency and neuropathy. This patient is unable to cut nails himself since the patient cannot reach his nails.These nails are painful walking and wearing shoes.  Patient says she has pai in her right foot and points to her bunion.  She is accompanied by her daughter and interpreter.  This patient presents for at risk foot care today.  General Appearance  Alert, conversant and in no acute stress.  Vascular  Dorsalis pedis are palpable  bilaterally. Posterior tibial pulses are absent  B/L. Capillary return is within normal limits  bilaterally. Cold feet  Bilaterally.  Absent hair digitally.  Neurologic  Senn-Weinstein monofilament wire test diminished bilaterally. Muscle power within normal limits bilaterally.  Nails Thick disfigured discolored nails with subungual debris  from hallux to fifth toes bilaterally. No evidence of bacterial infection or drainage bilaterally.  Orthopedic  No limitations of motion  feet .  No crepitus or effusions noted.  No bony pathology or digital deformities noted. HAV  B/L.  Skin  normotropic skin with no porokeratosis noted bilaterally.  No signs of infections or ulcers noted.     Onychomycosis  Pain in right toes  Pain in left toes  HAV  B/L.  Consent was obtained for treatment procedures.   Mechanical debridement of nails 1-5  bilaterally performed with a nail nipper.  Filed with dremel without incident. Patient qualifies for diabetic shoes due to DPN and HAV  B/L.   Return office visit  3 months.                   Told patient to return for periodic foot care and evaluation due to potential at risk complications.   Gardiner Barefoot DPM

## 2021-06-18 NOTE — Assessment & Plan Note (Signed)
Preventative protocols reviewed and updated unless pt declined. Discussed healthy diet and lifestyle.  

## 2021-06-18 NOTE — Assessment & Plan Note (Addendum)
Maintaining levels off iron replacement.

## 2021-06-18 NOTE — Assessment & Plan Note (Signed)
GFR seems stable in 40s. Continue to monitor.

## 2021-06-18 NOTE — Assessment & Plan Note (Addendum)
Continue entresto and carvedilol.

## 2021-06-18 NOTE — Assessment & Plan Note (Addendum)
Continues low dose sertraline 25mg  daily - family notes significant improvement since starting antidepressant - decreased crying, happier mood - however they haven't told patient what this medicine is used for as they don't think she would take it.

## 2021-06-18 NOTE — Assessment & Plan Note (Addendum)
Chronic, stable period on atorvastatin, with LDL at goal. The ASCVD Risk score (Arnett DK, et al., 2019) failed to calculate for the following reasons:   The 2019 ASCVD risk score is only valid for ages 45 to 106

## 2021-06-18 NOTE — Assessment & Plan Note (Signed)
Interestingly, her neuropathy started in hands and is now spreading into feet. Presumed diabetic neuropathy. Suggested trial of OTC alpha lipoic acid 600mg  daily, update with effect. No burning neuropathic pain so don't think would benefit from gabapentin/lyrica.

## 2021-06-18 NOTE — Assessment & Plan Note (Signed)
Advanced directive discussion - would want Bethena Roys to be HCPOA. Does not want resuscitation, intubation, dialysis. Packet provided today.

## 2021-06-18 NOTE — Assessment & Plan Note (Signed)
Chronic normocytic anemia, anticipate due to CKD. Will continue to monitor.

## 2021-06-18 NOTE — Assessment & Plan Note (Addendum)
Followed by cards and EP, on brilinta, aspirin, entresto and BB.

## 2021-06-18 NOTE — Assessment & Plan Note (Addendum)
Chronic, currently diet controlled. Adequate control. Discussed goal A1c ideally <7% if able to tolerate well. Has previously declined medication. RTC 6 mo DM f/u visit.

## 2021-06-18 NOTE — Assessment & Plan Note (Signed)

## 2021-07-16 ENCOUNTER — Ambulatory Visit (INDEPENDENT_AMBULATORY_CARE_PROVIDER_SITE_OTHER): Payer: Medicare Other

## 2021-07-16 DIAGNOSIS — I255 Ischemic cardiomyopathy: Secondary | ICD-10-CM

## 2021-07-16 LAB — CUP PACEART REMOTE DEVICE CHECK
Battery Remaining Longevity: 50 mo
Battery Remaining Percentage: 55 %
Battery Voltage: 2.95 V
Brady Statistic AP VP Percent: 4.8 %
Brady Statistic AP VS Percent: 1 %
Brady Statistic AS VP Percent: 92 %
Brady Statistic AS VS Percent: 3.1 %
Brady Statistic RA Percent Paced: 4.9 %
Date Time Interrogation Session: 20221027020017
HighPow Impedance: 84 Ohm
HighPow Impedance: 84 Ohm
Implantable Lead Implant Date: 20180906
Implantable Lead Implant Date: 20180906
Implantable Lead Implant Date: 20180906
Implantable Lead Location: 753858
Implantable Lead Location: 753859
Implantable Lead Location: 753860
Implantable Pulse Generator Implant Date: 20180906
Lead Channel Impedance Value: 1675 Ohm
Lead Channel Impedance Value: 450 Ohm
Lead Channel Impedance Value: 610 Ohm
Lead Channel Pacing Threshold Amplitude: 0.625 V
Lead Channel Pacing Threshold Amplitude: 0.625 V
Lead Channel Pacing Threshold Amplitude: 1.5 V
Lead Channel Pacing Threshold Pulse Width: 0.5 ms
Lead Channel Pacing Threshold Pulse Width: 0.5 ms
Lead Channel Pacing Threshold Pulse Width: 0.5 ms
Lead Channel Sensing Intrinsic Amplitude: 0.5 mV
Lead Channel Sensing Intrinsic Amplitude: 11.9 mV
Lead Channel Setting Pacing Amplitude: 1.625
Lead Channel Setting Pacing Amplitude: 2 V
Lead Channel Setting Pacing Amplitude: 2.5 V
Lead Channel Setting Pacing Pulse Width: 0.5 ms
Lead Channel Setting Pacing Pulse Width: 0.5 ms
Lead Channel Setting Sensing Sensitivity: 0.5 mV
Pulse Gen Serial Number: 9776461

## 2021-07-24 ENCOUNTER — Other Ambulatory Visit: Payer: Self-pay | Admitting: Cardiovascular Disease

## 2021-07-24 NOTE — Progress Notes (Signed)
Remote ICD transmission.   

## 2021-07-26 ENCOUNTER — Other Ambulatory Visit: Payer: Self-pay | Admitting: Cardiovascular Disease

## 2021-07-27 ENCOUNTER — Telehealth: Payer: Self-pay | Admitting: Podiatry

## 2021-07-27 NOTE — Telephone Encounter (Signed)
Brilinta 60 mg bid was sent to pts pharmacy this morning @7 :52 AM.

## 2021-07-27 NOTE — Telephone Encounter (Signed)
Pts daughter left message stating she received a call that the diabetic shoes were in.. please call her to schedule an appt.

## 2021-07-30 ENCOUNTER — Encounter: Payer: Self-pay | Admitting: Podiatry

## 2021-07-30 ENCOUNTER — Ambulatory Visit (INDEPENDENT_AMBULATORY_CARE_PROVIDER_SITE_OTHER): Payer: Medicare Other | Admitting: Podiatry

## 2021-07-30 ENCOUNTER — Other Ambulatory Visit: Payer: Self-pay

## 2021-07-30 DIAGNOSIS — M2010 Hallux valgus (acquired), unspecified foot: Secondary | ICD-10-CM

## 2021-07-30 DIAGNOSIS — E1122 Type 2 diabetes mellitus with diabetic chronic kidney disease: Secondary | ICD-10-CM

## 2021-07-30 DIAGNOSIS — M2012 Hallux valgus (acquired), left foot: Secondary | ICD-10-CM | POA: Diagnosis not present

## 2021-07-30 DIAGNOSIS — N183 Chronic kidney disease, stage 3 unspecified: Secondary | ICD-10-CM

## 2021-07-30 DIAGNOSIS — E1140 Type 2 diabetes mellitus with diabetic neuropathy, unspecified: Secondary | ICD-10-CM | POA: Diagnosis not present

## 2021-07-30 DIAGNOSIS — M2011 Hallux valgus (acquired), right foot: Secondary | ICD-10-CM | POA: Diagnosis not present

## 2021-07-30 NOTE — Progress Notes (Signed)
Patient presents to the office to pick up his new diabetic shoes.  General Appearance  Alert, conversant and in no acute stress.  Vascular  Dorsalis pedis  are palpable  bilaterally.  Posterior tibial pulses are absent. Capillary return is within normal limits  bilaterally.  Cold feet bilaterally. Absent digital hair  B/l  Neurologic  Senn-Weinstein monofilament wire test diminished bilaterally. Muscle power within normal limits bilaterally.  Nails Thick disfigured discolored nails with subungual debris  from hallux to fifth toes bilaterally. No evidence of bacterial infection or drainage bilaterally.  Orthopedic  No limitations of motion  feet .  No crepitus or effusions noted.  No bony pathology or digital deformities noted. HAV  B/L.  Skin  normotropic skin with no porokeratosis noted bilaterally.  No signs of infections or ulcers noted.     Diabetes with neuropathy  HAV  B/L.  Dispense diabetic shoes.  RTC 6 weeks  Patient presents today and was dispensed 0ne pair ( two units) of medically necessary extra depth shoes with three pair( six units) of custom molded multiple density inserts. The shoes and the inserts are fitted to the patients ' feet and are noted to fit well and are free of defect.  Length and width of the shoes are also acceptable.  Patient was given written and verbal  instructions for wearing.  If any concerns arrive with the shoes or inserts, the patient is to call the office.Patient is to follow up with doctor in six weeks.   Gardiner Barefoot DPM

## 2021-09-17 ENCOUNTER — Ambulatory Visit: Payer: Medicare Other | Admitting: Podiatry

## 2021-10-01 ENCOUNTER — Encounter: Payer: Self-pay | Admitting: Podiatry

## 2021-10-01 ENCOUNTER — Other Ambulatory Visit: Payer: Self-pay

## 2021-10-01 ENCOUNTER — Ambulatory Visit (INDEPENDENT_AMBULATORY_CARE_PROVIDER_SITE_OTHER): Payer: Medicare Other | Admitting: Podiatry

## 2021-10-01 DIAGNOSIS — M2010 Hallux valgus (acquired), unspecified foot: Secondary | ICD-10-CM

## 2021-10-01 DIAGNOSIS — M79675 Pain in left toe(s): Secondary | ICD-10-CM | POA: Diagnosis not present

## 2021-10-01 DIAGNOSIS — E1122 Type 2 diabetes mellitus with diabetic chronic kidney disease: Secondary | ICD-10-CM

## 2021-10-01 DIAGNOSIS — E1142 Type 2 diabetes mellitus with diabetic polyneuropathy: Secondary | ICD-10-CM | POA: Diagnosis not present

## 2021-10-01 DIAGNOSIS — N183 Chronic kidney disease, stage 3 unspecified: Secondary | ICD-10-CM | POA: Diagnosis not present

## 2021-10-01 DIAGNOSIS — M79674 Pain in right toe(s): Secondary | ICD-10-CM | POA: Diagnosis not present

## 2021-10-01 DIAGNOSIS — B351 Tinea unguium: Secondary | ICD-10-CM

## 2021-10-01 DIAGNOSIS — E1140 Type 2 diabetes mellitus with diabetic neuropathy, unspecified: Secondary | ICD-10-CM | POA: Insufficient documentation

## 2021-10-01 NOTE — Progress Notes (Signed)
This patient returns to my office for at risk foot care.  This patient requires this care by a professional since this patient will be at risk due to having diabetes and renal insufficiency and neuropathy. This patient is unable to cut nails himself since the patient cannot reach his nails.These nails are painful walking and wearing shoes.  Patient says she has pai in her right foot and points to her bunion.  She is accompanied by her daughter and interpreter.  This patient presents for at risk foot care today.  General Appearance  Alert, conversant and in no acute stress.  Vascular  Dorsalis pedis are palpable  bilaterally. Posterior tibial pulses are absent  B/L. Capillary return is within normal limits  bilaterally. Cold feet  Bilaterally.  Absent hair digitally.  Neurologic  Senn-Weinstein monofilament wire test diminished bilaterally. Muscle power within normal limits bilaterally.  Nails Thick disfigured discolored nails with subungual debris  from hallux to fifth toes bilaterally. No evidence of bacterial infection or drainage bilaterally.  Orthopedic  No limitations of motion  feet .  No crepitus or effusions noted.  No bony pathology or digital deformities noted. HAV  B/L.  Skin  normotropic skin with no porokeratosis noted bilaterally.  No signs of infections or ulcers noted.     Onychomycosis  Pain in right toes  Pain in left toes  HAV  B/L.  Consent was obtained for treatment procedures.   Mechanical debridement of nails 1-5  bilaterally performed with a nail nipper.  Filed with dremel without incident. Patient needs to talk with the pedorthist.   Return office visit  3 months.                   Told patient to return for periodic foot care and evaluation due to potential at risk complications.   Gardiner Barefoot DPM

## 2021-10-02 ENCOUNTER — Ambulatory Visit: Payer: Commercial Managed Care - HMO

## 2021-10-02 DIAGNOSIS — M2010 Hallux valgus (acquired), unspecified foot: Secondary | ICD-10-CM

## 2021-10-02 DIAGNOSIS — E1142 Type 2 diabetes mellitus with diabetic polyneuropathy: Secondary | ICD-10-CM

## 2021-10-02 NOTE — Progress Notes (Signed)
SITUATION Reason for Consult: Follow-up with diabetic shoes Patient / Caregiver Report: Patient cannot wear shoes as they are too tight  OBJECTIVE DATA History / Diagnosis:    ICD-10-CM   1. Diabetic polyneuropathy associated with type 2 diabetes mellitus (HCC)  E11.42     2. Hav (hallux abducto valgus), unspecified laterality  M20.10       Change in Pathology: None  ACTIONS PERFORMED Patient's equipment was checked for structural stability and fit. Shoes to be returned and [id number] bunion shoes to be ordered size 8W All questions answered and concerns addressed.  PLAN Patient to be scheduled when shoes available. Plan of care discussed with and agreed upon by patient / caregiver.

## 2021-10-14 ENCOUNTER — Telehealth: Payer: Self-pay

## 2021-10-14 NOTE — Telephone Encounter (Signed)
Filled and in Lisa's box 

## 2021-10-14 NOTE — Telephone Encounter (Signed)
Received another faxed order from [facility] for DM shoes due to previous order expiring before pt picked up shoes.  Placed order in Dr. Synthia Innocent box.

## 2021-10-14 NOTE — Telephone Encounter (Signed)
Faxed form to [facility], attn:  Dawn at [phone].

## 2021-10-15 ENCOUNTER — Ambulatory Visit (INDEPENDENT_AMBULATORY_CARE_PROVIDER_SITE_OTHER): Payer: Medicare Other

## 2021-10-15 DIAGNOSIS — I255 Ischemic cardiomyopathy: Secondary | ICD-10-CM | POA: Diagnosis not present

## 2021-10-15 LAB — CUP PACEART REMOTE DEVICE CHECK
Battery Remaining Longevity: 49 mo
Battery Remaining Percentage: 53 %
Battery Voltage: 2.95 V
Brady Statistic AP VP Percent: 5.2 %
Brady Statistic AP VS Percent: 1 %
Brady Statistic AS VP Percent: 91 %
Brady Statistic AS VS Percent: 3.2 %
Brady Statistic RA Percent Paced: 5.3 %
Date Time Interrogation Session: 20230126020016
HighPow Impedance: 82 Ohm
HighPow Impedance: 82 Ohm
Implantable Lead Implant Date: 20180906
Implantable Lead Implant Date: 20180906
Implantable Lead Implant Date: 20180906
Implantable Lead Location: 753858
Implantable Lead Location: 753859
Implantable Lead Location: 753860
Implantable Pulse Generator Implant Date: 20180906
Lead Channel Impedance Value: 1600 Ohm
Lead Channel Impedance Value: 450 Ohm
Lead Channel Impedance Value: 560 Ohm
Lead Channel Pacing Threshold Amplitude: 0.625 V
Lead Channel Pacing Threshold Amplitude: 0.625 V
Lead Channel Pacing Threshold Amplitude: 1.375 V
Lead Channel Pacing Threshold Pulse Width: 0.5 ms
Lead Channel Pacing Threshold Pulse Width: 0.5 ms
Lead Channel Pacing Threshold Pulse Width: 0.5 ms
Lead Channel Sensing Intrinsic Amplitude: 0.6 mV
Lead Channel Sensing Intrinsic Amplitude: 11.9 mV
Lead Channel Setting Pacing Amplitude: 1.625
Lead Channel Setting Pacing Amplitude: 2 V
Lead Channel Setting Pacing Amplitude: 2.375
Lead Channel Setting Pacing Pulse Width: 0.5 ms
Lead Channel Setting Pacing Pulse Width: 0.5 ms
Lead Channel Setting Sensing Sensitivity: 0.5 mV
Pulse Gen Serial Number: 9776461

## 2021-10-26 NOTE — Progress Notes (Signed)
Remote ICD transmission.   

## 2021-11-05 ENCOUNTER — Telehealth: Payer: Self-pay

## 2021-11-05 NOTE — Telephone Encounter (Signed)
Lvm fo pt to call back to schedule an appt to pick up diabetic shoes and inserts.

## 2021-12-08 ENCOUNTER — Other Ambulatory Visit: Payer: Self-pay

## 2021-12-08 ENCOUNTER — Ambulatory Visit (INDEPENDENT_AMBULATORY_CARE_PROVIDER_SITE_OTHER): Payer: Medicare Other | Admitting: Family Medicine

## 2021-12-08 ENCOUNTER — Encounter: Payer: Self-pay | Admitting: Family Medicine

## 2021-12-08 VITALS — BP 120/62 | HR 71 | Temp 97.6°F | Ht 61.75 in | Wt 128.1 lb

## 2021-12-08 DIAGNOSIS — J4520 Mild intermittent asthma, uncomplicated: Secondary | ICD-10-CM | POA: Diagnosis not present

## 2021-12-08 DIAGNOSIS — F4321 Adjustment disorder with depressed mood: Secondary | ICD-10-CM

## 2021-12-08 DIAGNOSIS — G6289 Other specified polyneuropathies: Secondary | ICD-10-CM | POA: Diagnosis not present

## 2021-12-08 DIAGNOSIS — E1122 Type 2 diabetes mellitus with diabetic chronic kidney disease: Secondary | ICD-10-CM

## 2021-12-08 DIAGNOSIS — N183 Chronic kidney disease, stage 3 unspecified: Secondary | ICD-10-CM | POA: Diagnosis not present

## 2021-12-08 DIAGNOSIS — N1832 Chronic kidney disease, stage 3b: Secondary | ICD-10-CM | POA: Diagnosis not present

## 2021-12-08 DIAGNOSIS — E1142 Type 2 diabetes mellitus with diabetic polyneuropathy: Secondary | ICD-10-CM | POA: Diagnosis not present

## 2021-12-08 DIAGNOSIS — E1140 Type 2 diabetes mellitus with diabetic neuropathy, unspecified: Secondary | ICD-10-CM | POA: Diagnosis not present

## 2021-12-08 DIAGNOSIS — D631 Anemia in chronic kidney disease: Secondary | ICD-10-CM

## 2021-12-08 LAB — BASIC METABOLIC PANEL
BUN: 34 mg/dL — ABNORMAL HIGH (ref 6–23)
CO2: 26 mEq/L (ref 19–32)
Calcium: 9.8 mg/dL (ref 8.4–10.5)
Chloride: 100 mEq/L (ref 96–112)
Creatinine, Ser: 1.18 mg/dL (ref 0.40–1.20)
GFR: 42.23 mL/min — ABNORMAL LOW (ref >60.00)
Glucose, Bld: 200 mg/dL — ABNORMAL HIGH (ref 70–99)
Potassium: 5.2 mEq/L — ABNORMAL HIGH (ref 3.5–5.1)
Sodium: 135 mEq/L (ref 135–145)

## 2021-12-08 LAB — CBC WITH DIFFERENTIAL/PLATELET
Basophils Absolute: 0.1 10*3/uL (ref 0.0–0.1)
Basophils Relative: 0.6 % (ref 0.0–3.0)
Eosinophils Absolute: 0.2 10*3/uL (ref 0.0–0.7)
Eosinophils Relative: 2.7 % (ref 0.0–5.0)
HCT: 32.4 % — ABNORMAL LOW (ref 36.0–46.0)
Hemoglobin: 10.9 g/dL — ABNORMAL LOW (ref 12.0–15.0)
Lymphocytes Relative: 27.1 % (ref 12.0–46.0)
Lymphs Abs: 2.1 10*3/uL (ref 0.7–4.0)
MCHC: 33.6 g/dL (ref 30.0–36.0)
MCV: 87 fl (ref 78.0–100.0)
Monocytes Absolute: 0.6 10*3/uL (ref 0.1–1.0)
Monocytes Relative: 8 % (ref 3.0–12.0)
Neutro Abs: 4.8 10*3/uL (ref 1.4–7.7)
Neutrophils Relative %: 61.6 % (ref 43.0–77.0)
Platelets: 192 10*3/uL (ref 150.0–400.0)
RBC: 3.72 Mil/uL — ABNORMAL LOW (ref 3.87–5.11)
RDW: 14.7 % (ref 11.5–15.5)
WBC: 7.8 10*3/uL (ref 4.0–10.5)

## 2021-12-08 LAB — MICROALBUMIN / CREATININE URINE RATIO
Creatinine,U: 112.7 mg/dL
Microalb Creat Ratio: 26.8 mg/g (ref 0.0–30.0)
Microalb, Ur: 30.2 mg/dL — ABNORMAL HIGH (ref 0.0–1.9)

## 2021-12-08 LAB — POCT GLYCOSYLATED HEMOGLOBIN (HGB A1C): Hemoglobin A1C: 7.7 % — AB (ref 4.0–5.6)

## 2021-12-08 LAB — FOLATE: Folate: >24.2 ng/mL (ref >5.9)

## 2021-12-08 LAB — VITAMIN B12: Vitamin B-12: 789 pg/mL (ref 211–911)

## 2021-12-08 MED ORDER — DAPAGLIFLOZIN PROPANEDIOL 10 MG PO TABS: 10.0000 mg | ORAL_TABLET | Freq: Every day | ORAL | 6 refills | Status: DC

## 2021-12-08 MED ORDER — ALBUTEROL SULFATE (2.5 MG/3ML) 0.083% IN NEBU: 2.5000 mg | INHALATION_SOLUTION | Freq: Three times a day (TID) | RESPIRATORY_TRACT | 1 refills | Status: AC | PRN

## 2021-12-08 NOTE — Patient Instructions (Addendum)
Laboratorios hoy, reviso de [place].  ?Trate farxiga '10mg'$  medicina para diabetes y ri?ones. Dejeme saber si esta medicina es muy cara.  ?Llame para hacer cita para mamograma y densitometria:  ?[place] [phone].  ?Regresar en 6 meses para fisico/wellness visit.  ?

## 2021-12-08 NOTE — Progress Notes (Signed)
Patient ID: April Short, female    DOB: 1934-10-26, 87 y.o.   MRN: [id number]  This visit was conducted in person.  BP 120/62   Pulse 71   Temp 97.6 F (36.4 C) (Temporal)   Ht 5' 1.75" (1.568 m)   Wt 128 lb 2 oz (58.1 kg)   SpO2 96%   BMI 23.62 kg/m    CC: DM f/u visit  Subjective:   HPI: April Short is a 87 y.o. female presenting on 12/08/2021 for Diabetes (Here for 6 mo f/u.  Pt accompanied by daughter, April Short. )   Daughter April Short her caregiver is present.  Other daughter April Short also present via phone.  She notes difficulty with decreased appetite to certain foods - especially meats and eggs. She does take glucerna supplement every other day.   She had respiratory illness 1.5 months ago ?bronchitis. She had some wheezing - treated with left over albuterol nebulizer with benefit. Denies h/o asthma or COPD or smoking history. They request albuterol neb refill.   Notes intermittent numbness to hands, not feet.  Also notes leg pains to anterior shins after walking 1.5 blocks. Sitting to rest helps with this.   DM - does not regularly check sugars. Compliant with antihyperglycemic regimen which includes: diet controlled. Previously on metformin (GI upset) and [place]. Denies low sugars or hypoglycemic symptoms. Denies paresthesias, blurry vision. Last diabetic eye exam DUE. Glucometer brand: unsure but has at hime. Last foot exam: 09/2021 through podiatrist - chart updated. DSME: declined. Lab Results  Component Value Date   HGBA1C 7.7 (A) 12/08/2021   Diabetic Foot Exam - Simple   No data filed    Lab Results  Component Value Date   MICROALBUR 30.2 (H) 12/08/2021        Relevant past medical, surgical, family and social history reviewed and updated as indicated. Interim medical history since our last visit reviewed. Allergies and medications reviewed and updated. Outpatient Medications Prior to Visit  Medication Sig Dispense Refill   Alpha-Lipoic Acid 600  MG CAPS Take 1 capsule (600 mg total) by mouth daily.     aspirin EC 81 MG tablet Take 81 mg by mouth daily. Swallow whole.     atorvastatin (LIPITOR) 40 MG tablet Take 1 tablet (40 mg total) by mouth daily. 90 tablet 3   carvedilol (COREG) 25 MG tablet Take 1 tablet (25 mg total) by mouth 2 (two) times daily with a meal. 180 tablet 3   ENTRESTO 49-51 MG TAKE 1 TABLET BY MOUTH TWICE A DAY 60 tablet 12   sertraline (ZOLOFT) 25 MG tablet Take 1 tablet (25 mg total) by mouth daily. 90 tablet 3   ticagrelor (BRILINTA) 60 MG TABS tablet TAKE 1 TABLET BY MOUTH 2 TIMES DAILY. 60 tablet 8   No facility-administered medications prior to visit.     Per HPI unless specifically indicated in ROS section below Review of Systems  Objective:  BP 120/62   Pulse 71   Temp 97.6 F (36.4 C) (Temporal)   Ht 5' 1.75" (1.568 m)   Wt 128 lb 2 oz (58.1 kg)   SpO2 96%   BMI 23.62 kg/m   Wt Readings from Last 3 Encounters:  12/08/21 128 lb 2 oz (58.1 kg)  06/15/21 128 lb (58.1 kg)  12/25/20 126 lb 9.6 oz (57.4 kg)      Physical Exam Vitals and nursing note reviewed.  Constitutional:      Appearance: Normal appearance. She is not ill-appearing.  Eyes:     Extraocular Movements: Extraocular movements intact.     Conjunctiva/sclera: Conjunctivae normal.     Pupils: Pupils are equal, round, and reactive to light.  Cardiovascular:     Rate and Rhythm: Normal rate and regular rhythm.     Pulses: Normal pulses.     Heart sounds: Normal heart sounds. No murmur heard. Pulmonary:     Effort: Pulmonary effort is normal. No respiratory distress.     Breath sounds: Normal breath sounds. No wheezing, rhonchi or rales.  Musculoskeletal:     Right lower leg: No edema.     Left lower leg: No edema.     Comments: Foot exam not done  Skin:    General: Skin is warm and dry.     Findings: No rash.  Neurological:     Mental Status: She is alert.  Psychiatric:        Mood and Affect: Mood normal.         Behavior: Behavior normal.      Results for orders placed or performed in visit on 12/08/21  Microalbumin / creatinine urine ratio  Result Value Ref Range   Microalb, Ur 30.2 (H) 0.0 - 1.9 mg/dL   Creatinine,U 865.7 mg/dL   Microalb Creat Ratio 26.8 0.0 - 30.0 mg/g  Basic metabolic panel  Result Value Ref Range   Sodium 135 135 - 145 mEq/L   Potassium 5.2 (H) 3.5 - 5.1 mEq/L   Chloride 100 96 - 112 mEq/L   CO2 26 19 - 32 mEq/L   Glucose, Bld 200 (H) 70 - 99 mg/dL   BUN 34 (H) 6 - 23 mg/dL   Creatinine, Ser 8.46 0.40 - 1.20 mg/dL   GFR 96.29 (L) >52.84 mL/min   Calcium 9.8 8.4 - 10.5 mg/dL  CBC with Differential/Platelet  Result Value Ref Range   WBC 7.8 4.0 - 10.5 K/uL   RBC 3.72 (L) 3.87 - 5.11 Mil/uL   Hemoglobin 10.9 (L) 12.0 - 15.0 g/dL   HCT 13.2 (L) 44.0 - 10.2 %   MCV 87.0 78.0 - 100.0 fl   MCHC 33.6 30.0 - 36.0 g/dL   RDW 72.5 36.6 - 44.0 %   Platelets 192.0 150.0 - 400.0 K/uL   Neutrophils Relative % 61.6 43.0 - 77.0 %   Lymphocytes Relative 27.1 12.0 - 46.0 %   Monocytes Relative 8.0 3.0 - 12.0 %   Eosinophils Relative 2.7 0.0 - 5.0 %   Basophils Relative 0.6 0.0 - 3.0 %   Neutro Abs 4.8 1.4 - 7.7 K/uL   Lymphs Abs 2.1 0.7 - 4.0 K/uL   Monocytes Absolute 0.6 0.1 - 1.0 K/uL   Eosinophils Absolute 0.2 0.0 - 0.7 K/uL   Basophils Absolute 0.1 0.0 - 0.1 K/uL  Vitamin B12  Result Value Ref Range   Vitamin B-12 789 211 - 911 pg/mL  Folate  Result Value Ref Range   Folate >24.2 >5.9 ng/mL  Vitamin B1  Result Value Ref Range   Vitamin B1 (Thiamine) 13 8 - 30 nmol/L  POCT glycosylated hemoglobin (Hb A1C)  Result Value Ref Range   Hemoglobin A1C 7.7 (A) 4.0 - 5.6 %   HbA1c POC (<> result, manual entry)     HbA1c, POC (prediabetic range)     HbA1c, POC (controlled diabetic range)      Assessment & Plan:  This visit occurred during the SARS-CoV-2 public health emergency.  Safety protocols were in place, including screening questions prior to the  visit,  additional usage of staff PPE, and extensive cleaning of exam room while observing appropriate contact time as indicated for disinfecting solutions.   They will call to schedule mammo/DEXA.  Problem List Items Addressed This Visit     Type 2 diabetes mellitus with peripheral neuropathy (HCC) - Primary    Chronic, stable A1c <8%.  Provided with diabetic diet recommendations.       Relevant Medications   dapagliflozin propanediol (FARXIGA) 10 MG TABS tablet   Other Relevant Orders   POCT glycosylated hemoglobin (Hb A1C) (Completed)   Microalbumin / creatinine urine ratio (Completed)   Basic metabolic panel (Completed)   CBC with Differential/Platelet (Completed)   Vitamin B12 (Completed)   Folate (Completed)   Vitamin B1 (Completed)   Peripheral neuropathy    Atypical for diabetic neuropathy as predominantly to hands, not feet.  Continue alpha lipoic acid.  Update B vitamin levels       Adjustment disorder with depressed mood    Stable period on sertraline 25mg  daily.  Family desires pt to stay on this medication as they've noted significant benefit since commencement.       CKD stage 3 due to type 2 diabetes mellitus (HCC)    Chronic, stable with GFR 40s.  Discussed benefits of SGLT2i, along with possible side effects to monitor for.  Will start low dose farxiga 10mg  daily.       Relevant Medications   dapagliflozin propanediol (FARXIGA) 10 MG TABS tablet   Anemia    Check vitamin levels (B12, folate). H/o IDA, CKD. Consider updated iron panel next labs.       Reactive airway disease    Only with respiratory infections - albuterol neb refilled PRN.         Meds ordered this encounter  Medications   albuterol (PROVENTIL) (2.5 MG/3ML) 0.083% nebulizer solution    Sig: Take 3 mLs (2.5 mg total) by nebulization 3 (three) times daily as needed for wheezing or shortness of breath.    Dispense:  75 mL    Refill:  1   dapagliflozin propanediol (FARXIGA) 10 MG TABS tablet     Sig: Take 1 tablet (10 mg total) by mouth daily before breakfast.    Dispense:  30 tablet    Refill:  6   Orders Placed This Encounter  Procedures   Microalbumin / creatinine urine ratio   Basic metabolic panel   CBC with Differential/Platelet   Vitamin B12   Folate   Vitamin B1   POCT glycosylated hemoglobin (Hb A1C)     Patient Instructions  Laboratorios hoy, reviso de orina hoy.  Trate farxiga 10mg  medicina para diabetes y riones. Dejeme saber si esta medicina es muy cara.  Llame para hacer cita para mamograma y densitometria:  [facility] [phone].  Regresar en 6 meses para fisico/wellness visit.   Follow up plan: Return in about 6 months (around 06/10/2022) for medicare wellness visit, annual exam, prior fasting for blood work.  Eustaquio Boyden, MD

## 2021-12-12 ENCOUNTER — Encounter: Payer: Self-pay | Admitting: Family Medicine

## 2021-12-12 DIAGNOSIS — J45909 Unspecified asthma, uncomplicated: Secondary | ICD-10-CM | POA: Insufficient documentation

## 2021-12-12 LAB — VITAMIN B1: Vitamin B1 (Thiamine): 13 nmol/L (ref 8–30)

## 2021-12-12 NOTE — Assessment & Plan Note (Signed)
Chronic, stable A1c <8%.  ?Provided with diabetic diet recommendations.  ?

## 2021-12-12 NOTE — Assessment & Plan Note (Signed)
Only with respiratory infections - albuterol neb refilled PRN.  ?

## 2021-12-12 NOTE — Assessment & Plan Note (Signed)
Atypical for diabetic neuropathy as predominantly to hands, not feet.  ?Continue alpha lipoic acid.  ?Update B vitamin levels  ?

## 2021-12-12 NOTE — Assessment & Plan Note (Addendum)
Check vitamin levels (B12, folate). H/o IDA, CKD. Consider updated iron panel next labs.  ?

## 2021-12-12 NOTE — Assessment & Plan Note (Signed)
Chronic, stable with GFR 40s.  ?Discussed benefits of SGLT2i, along with possible side effects to monitor for.  ?Will start low dose farxiga '10mg'$  daily.  ?

## 2021-12-12 NOTE — Assessment & Plan Note (Signed)
Stable period on sertraline '25mg'$  daily.  ?Family desires pt to stay on this medication as they've noted significant benefit since commencement.  ?

## 2021-12-14 ENCOUNTER — Encounter: Payer: Self-pay | Admitting: Family Medicine

## 2021-12-18 ENCOUNTER — Other Ambulatory Visit: Payer: Self-pay | Admitting: Family Medicine

## 2021-12-28 ENCOUNTER — Telehealth: Payer: Self-pay

## 2021-12-28 DIAGNOSIS — E2839 Other primary ovarian failure: Secondary | ICD-10-CM

## 2021-12-28 DIAGNOSIS — Z1231 Encounter for screening mammogram for malignant neoplasm of breast: Secondary | ICD-10-CM

## 2021-12-28 NOTE — Telephone Encounter (Signed)
Would like to have dexa scan ordered as well.  ?

## 2021-12-28 NOTE — Addendum Note (Signed)
Addended by: Ria Bush on: 12/28/2021 02:03 PM ? ? Modules accepted: Orders ? ?

## 2021-12-28 NOTE — Telephone Encounter (Signed)
Daughter called office to get order paced for mammogram. She needs it have sent at breast center in [place].  ?

## 2021-12-28 NOTE — Telephone Encounter (Signed)
Orders placed. They may call to schedule.  ?[place] [phone] ? ?

## 2021-12-28 NOTE — Telephone Encounter (Signed)
Spoke with pt's daughter, Bethena Roys (on dpr), relaying Dr. Synthia Innocent message.  Verbalizes understanding and will schedule appt.  ?

## 2022-01-11 ENCOUNTER — Ambulatory Visit (INDEPENDENT_AMBULATORY_CARE_PROVIDER_SITE_OTHER): Payer: Medicare Other | Admitting: Podiatry

## 2022-01-11 ENCOUNTER — Encounter: Payer: Self-pay | Admitting: Podiatry

## 2022-01-11 DIAGNOSIS — E1142 Type 2 diabetes mellitus with diabetic polyneuropathy: Secondary | ICD-10-CM | POA: Diagnosis not present

## 2022-01-11 DIAGNOSIS — E1122 Type 2 diabetes mellitus with diabetic chronic kidney disease: Secondary | ICD-10-CM

## 2022-01-11 DIAGNOSIS — M79674 Pain in right toe(s): Secondary | ICD-10-CM | POA: Diagnosis not present

## 2022-01-11 DIAGNOSIS — B351 Tinea unguium: Secondary | ICD-10-CM | POA: Diagnosis not present

## 2022-01-11 DIAGNOSIS — M79675 Pain in left toe(s): Secondary | ICD-10-CM

## 2022-01-11 DIAGNOSIS — N183 Chronic kidney disease, stage 3 unspecified: Secondary | ICD-10-CM | POA: Diagnosis not present

## 2022-01-11 NOTE — Progress Notes (Signed)
This patient returns to my office for at risk foot care.  This patient requires this care by a professional since this patient will be at risk due to having diabetes and renal insufficiency and neuropathy. This patient is unable to cut nails himself since the patient cannot reach his nails.These nails are painful walking and wearing shoes.   She is accompanied by her daughter.    This patient presents for at risk foot care today.  General Appearance  Alert, conversant and in no acute stress.  Vascular  Dorsalis pedis are palpable  bilaterally. Posterior tibial pulses are absent  B/L. Capillary return is within normal limits  bilaterally. Cold feet  Bilaterally.  Absent hair digitally.  Neurologic  Senn-Weinstein monofilament wire test diminished bilaterally. Muscle power within normal limits bilaterally.  Nails Thick disfigured discolored nails with subungual debris  from hallux to fifth toes bilaterally. No evidence of bacterial infection or drainage bilaterally.  Orthopedic  No limitations of motion  feet .  No crepitus or effusions noted.  No bony pathology or digital deformities noted. HAV  B/L.  Skin  normotropic skin with no porokeratosis noted bilaterally.  No signs of infections or ulcers noted.     Onychomycosis  Pain in right toes  Pain in left toes  HAV  B/L.  Consent was obtained for treatment procedures.   Mechanical debridement of nails 1-5  bilaterally performed with a nail nipper.  Filed with dremel without incident. Patient needs to have the status of her shoes checked.   Return office visit  3 months.                   Told patient to return for periodic foot care and evaluation due to potential at risk complications.   Maira Christon DPM  

## 2022-01-11 NOTE — Telephone Encounter (Signed)
Pts CMN has expired but I have printed a new one and will be faxing it today.

## 2022-01-12 ENCOUNTER — Ambulatory Visit
Admission: RE | Admit: 2022-01-12 | Discharge: 2022-01-12 | Disposition: A | Discharge status: Home / Self Care | Payer: Medicare Other | Source: Ambulatory Visit | Attending: Family Medicine | Admitting: Family Medicine

## 2022-01-12 ENCOUNTER — Ambulatory Visit: Payer: Medicare Other

## 2022-01-12 DIAGNOSIS — Z1231 Encounter for screening mammogram for malignant neoplasm of breast: Secondary | ICD-10-CM | POA: Diagnosis not present

## 2022-01-14 ENCOUNTER — Telehealth: Payer: Self-pay

## 2022-01-14 ENCOUNTER — Ambulatory Visit (INDEPENDENT_AMBULATORY_CARE_PROVIDER_SITE_OTHER): Payer: Medicare Other

## 2022-01-14 DIAGNOSIS — I255 Ischemic cardiomyopathy: Secondary | ICD-10-CM

## 2022-01-14 LAB — CUP PACEART REMOTE DEVICE CHECK
Battery Remaining Longevity: 47 mo
Battery Remaining Percentage: 50 %
Battery Voltage: 2.95 V
Brady Statistic AP VP Percent: 5.6 %
Brady Statistic AP VS Percent: 1 %
Brady Statistic AS VP Percent: 91 %
Brady Statistic AS VS Percent: 3.2 %
Brady Statistic RA Percent Paced: 5.7 %
Date Time Interrogation Session: 20230427020016
HighPow Impedance: 83 Ohm
HighPow Impedance: 83 Ohm
Implantable Lead Implant Date: 20180906
Implantable Lead Implant Date: 20180906
Implantable Lead Implant Date: 20180906
Implantable Lead Location: 753858
Implantable Lead Location: 753859
Implantable Lead Location: 753860
Implantable Pulse Generator Implant Date: 20180906
Lead Channel Impedance Value: 1650 Ohm
Lead Channel Impedance Value: 480 Ohm
Lead Channel Impedance Value: 560 Ohm
Lead Channel Pacing Threshold Amplitude: 0.625 V
Lead Channel Pacing Threshold Amplitude: 0.75 V
Lead Channel Pacing Threshold Amplitude: 1.25 V
Lead Channel Pacing Threshold Pulse Width: 0.5 ms
Lead Channel Pacing Threshold Pulse Width: 0.5 ms
Lead Channel Pacing Threshold Pulse Width: 0.5 ms
Lead Channel Sensing Intrinsic Amplitude: 1 mV
Lead Channel Sensing Intrinsic Amplitude: 11.9 mV
Lead Channel Setting Pacing Amplitude: 1.75 V
Lead Channel Setting Pacing Amplitude: 2 V
Lead Channel Setting Pacing Amplitude: 2.25 V
Lead Channel Setting Pacing Pulse Width: 0.5 ms
Lead Channel Setting Pacing Pulse Width: 0.5 ms
Lead Channel Setting Sensing Sensitivity: 0.5 mV
Pulse Gen Serial Number: 9776461

## 2022-01-14 NOTE — Telephone Encounter (Signed)
Received faxed order form from [facility] for therapeutic shoes.  Placed form in Dr. Synthia Innocent box.  ?

## 2022-01-18 NOTE — Telephone Encounter (Signed)
Attempted to fax orders back to [facility] and both attempts were unsuccessful, even after getting an alternate number. Paperwork left on [employer].  ?

## 2022-01-18 NOTE — Telephone Encounter (Signed)
Filled and in Lisa's box 

## 2022-01-21 NOTE — Telephone Encounter (Signed)
Emailed complete ?[email or web address] ? ?Sent to scan ?

## 2022-01-21 NOTE — Telephone Encounter (Addendum)
Attempted to fax order at original fax #:  [phone], then to alt fax #: [phone].  Received 'Failed Fax'.  Spoke with [employer] and was given 2nd alt fax #:  [phone] and email address:  [email or web address], in case fax still fails.  ? ?Fax failed again.  I fwd order to Amy to email at address above.  ?

## 2022-01-29 NOTE — Progress Notes (Signed)
Remote ICD transmission.   

## 2022-04-15 ENCOUNTER — Ambulatory Visit (INDEPENDENT_AMBULATORY_CARE_PROVIDER_SITE_OTHER): Payer: Medicare Other

## 2022-04-15 DIAGNOSIS — I255 Ischemic cardiomyopathy: Secondary | ICD-10-CM

## 2022-04-15 LAB — CUP PACEART REMOTE DEVICE CHECK
Battery Remaining Longevity: 44 mo
Battery Remaining Percentage: 48 %
Battery Voltage: 2.95 V
Brady Statistic AP VP Percent: 6.9 %
Brady Statistic AP VS Percent: 1 %
Brady Statistic AS VP Percent: 90 %
Brady Statistic AS VS Percent: 3.1 %
Brady Statistic RA Percent Paced: 7 %
Date Time Interrogation Session: 20230727020017
HighPow Impedance: 86 Ohm
HighPow Impedance: 86 Ohm
Implantable Lead Implant Date: 20180906
Implantable Lead Implant Date: 20180906
Implantable Lead Implant Date: 20180906
Implantable Lead Location: 753858
Implantable Lead Location: 753859
Implantable Lead Location: 753860
Implantable Pulse Generator Implant Date: 20180906
Lead Channel Impedance Value: 1650 Ohm
Lead Channel Impedance Value: 490 Ohm
Lead Channel Impedance Value: 590 Ohm
Lead Channel Pacing Threshold Amplitude: 0.625 V
Lead Channel Pacing Threshold Amplitude: 0.625 V
Lead Channel Pacing Threshold Amplitude: 1.25 V
Lead Channel Pacing Threshold Pulse Width: 0.5 ms
Lead Channel Pacing Threshold Pulse Width: 0.5 ms
Lead Channel Pacing Threshold Pulse Width: 0.5 ms
Lead Channel Sensing Intrinsic Amplitude: 1.5 mV
Lead Channel Sensing Intrinsic Amplitude: 11.9 mV
Lead Channel Setting Pacing Amplitude: 1.625
Lead Channel Setting Pacing Amplitude: 2 V
Lead Channel Setting Pacing Amplitude: 2.25 V
Lead Channel Setting Pacing Pulse Width: 0.5 ms
Lead Channel Setting Pacing Pulse Width: 0.5 ms
Lead Channel Setting Sensing Sensitivity: 0.5 mV
Pulse Gen Serial Number: 9776461

## 2022-04-19 ENCOUNTER — Ambulatory Visit (INDEPENDENT_AMBULATORY_CARE_PROVIDER_SITE_OTHER): Payer: Medicare Other | Admitting: Podiatry

## 2022-04-19 ENCOUNTER — Encounter: Payer: Self-pay | Admitting: Podiatry

## 2022-04-19 DIAGNOSIS — E1142 Type 2 diabetes mellitus with diabetic polyneuropathy: Secondary | ICD-10-CM | POA: Diagnosis not present

## 2022-04-19 DIAGNOSIS — M79675 Pain in left toe(s): Secondary | ICD-10-CM

## 2022-04-19 DIAGNOSIS — M2010 Hallux valgus (acquired), unspecified foot: Secondary | ICD-10-CM

## 2022-04-19 DIAGNOSIS — N183 Chronic kidney disease, stage 3 unspecified: Secondary | ICD-10-CM | POA: Diagnosis not present

## 2022-04-19 DIAGNOSIS — E1122 Type 2 diabetes mellitus with diabetic chronic kidney disease: Secondary | ICD-10-CM

## 2022-04-19 DIAGNOSIS — B351 Tinea unguium: Secondary | ICD-10-CM

## 2022-04-19 DIAGNOSIS — M79674 Pain in right toe(s): Secondary | ICD-10-CM

## 2022-04-19 NOTE — Progress Notes (Signed)
This patient returns to my office for at risk foot care.  This patient requires this care by a professional since this patient will be at risk due to having diabetes and renal insufficiency and neuropathy. This patient is unable to cut nails himself since the patient cannot reach his nails.These nails are painful walking and wearing shoes.   She is accompanied by her daughter.    This patient presents for at risk foot care today.  General Appearance  Alert, conversant and in no acute stress.  Vascular  Dorsalis pedis are palpable  bilaterally. Posterior tibial pulses are absent  B/L. Capillary return is within normal limits  bilaterally. Cold feet  Bilaterally.  Absent hair digitally.  Neurologic  Senn-Weinstein monofilament wire test diminished bilaterally. Muscle power within normal limits bilaterally.  Nails Thick disfigured discolored nails with subungual debris  from hallux to fifth toes bilaterally. No evidence of bacterial infection or drainage bilaterally.  Orthopedic  No limitations of motion  feet .  No crepitus or effusions noted.  No bony pathology or digital deformities noted. HAV  B/L.  Skin  normotropic skin with no porokeratosis noted bilaterally.  No signs of infections or ulcers noted.     Onychomycosis  Pain in right toes  Pain in left toes  HAV  B/L.  Consent was obtained for treatment procedures.   Mechanical debridement of nails 1-5  bilaterally performed with a nail nipper.  Filed with dremel without incident. Patient needs to have the status of her shoes checked.   Return office visit  3 months.                   Told patient to return for periodic foot care and evaluation due to potential at risk complications.   Gardiner Barefoot DPM

## 2022-05-06 NOTE — Progress Notes (Signed)
Remote ICD transmission.   

## 2022-05-10 ENCOUNTER — Ambulatory Visit (INDEPENDENT_AMBULATORY_CARE_PROVIDER_SITE_OTHER): Payer: Medicare Other | Admitting: Family Medicine

## 2022-05-10 ENCOUNTER — Encounter: Payer: Self-pay | Admitting: Family Medicine

## 2022-05-10 VITALS — BP 122/64 | HR 60 | Temp 97.3°F | Ht 61.75 in | Wt 121.5 lb

## 2022-05-10 DIAGNOSIS — R32 Unspecified urinary incontinence: Secondary | ICD-10-CM

## 2022-05-10 DIAGNOSIS — I5022 Chronic systolic (congestive) heart failure: Secondary | ICD-10-CM | POA: Diagnosis not present

## 2022-05-10 DIAGNOSIS — R413 Other amnesia: Secondary | ICD-10-CM

## 2022-05-10 DIAGNOSIS — N183 Chronic kidney disease, stage 3 unspecified: Secondary | ICD-10-CM

## 2022-05-10 DIAGNOSIS — E1122 Type 2 diabetes mellitus with diabetic chronic kidney disease: Secondary | ICD-10-CM

## 2022-05-10 DIAGNOSIS — E1142 Type 2 diabetes mellitus with diabetic polyneuropathy: Secondary | ICD-10-CM | POA: Diagnosis not present

## 2022-05-10 LAB — POC URINALSYSI DIPSTICK (AUTOMATED)
Bilirubin, UA: NEGATIVE
Blood, UA: NEGATIVE
Glucose, UA: POSITIVE — AB
Ketones, UA: NEGATIVE
Leukocytes, UA: NEGATIVE
Nitrite, UA: NEGATIVE
Protein, UA: NEGATIVE
Spec Grav, UA: 1.015 (ref 1.010–1.025)
Urobilinogen, UA: 0.2 E.U./dL
pH, UA: 5 (ref 5.0–8.0)

## 2022-05-10 LAB — POCT GLYCOSYLATED HEMOGLOBIN (HGB A1C): Hemoglobin A1C: 7 % — AB (ref 4.0–5.6)

## 2022-05-10 NOTE — Progress Notes (Unsigned)
Patient ID: April Short, female    DOB: 1935-06-15, 87 y.o.   MRN: [id number]  This visit was conducted in person.  BP 122/64   Pulse 60   Temp (!) 97.3 F (36.3 C) (Temporal)   Ht 5' 1.75" (1.568 m)   Wt 121 lb 8 oz (55.1 kg)   SpO2 95%   BMI 22.40 kg/m    CC: 6 mo DM f/u visit  Subjective:   HPI: April Short is a 87 y.o. female presenting on 05/10/2022 for Diabetes (Here for 6 mo f/u.  Pt accompanied by daughters, Constance Holster and Collins.  )   Sees cardiology for CAD and ischemic cardiomyopathy, on entresto, carvedilol, Brilinta and [place].   Had house screen - trouble with memory.   Out of room daughter mentions some episodes of urinary accidents   DM - does not regularly check sugars. Compliant with antihyperglycemic regimen which includes: Farxiga '10mg'$  daily. Denies low sugars or hypoglycemic symptoms. Denies blurry vision. + hand neuropathy/paresthesias/numbness. Last diabetic eye exam DUE. Glucometer brand: none. Last foot exam: 03/2022 with podiatry. DSME: offered, declines. Lab Results  Component Value Date   HGBA1C 7.0 (A) 05/10/2022   Diabetic Foot Exam - Simple   No data filed    Lab Results  Component Value Date   MICROALBUR 30.2 (H) 12/08/2021         Relevant past medical, surgical, family and social history reviewed and updated as indicated. Interim medical history since our last visit reviewed. Allergies and medications reviewed and updated. Outpatient Medications Prior to Visit  Medication Sig Dispense Refill   albuterol (PROVENTIL) (2.5 MG/3ML) 0.083% nebulizer solution Take 3 mLs (2.5 mg total) by nebulization 3 (three) times daily as needed for wheezing or shortness of breath. 75 mL 1   Alpha-Lipoic Acid 600 MG CAPS Take 1 capsule (600 mg total) by mouth daily.     aspirin EC 81 MG tablet Take 81 mg by mouth daily. Swallow whole.     atorvastatin (LIPITOR) 40 MG tablet Take 1 tablet (40 mg total) by mouth daily. 90 tablet 3   carvedilol  (COREG) 25 MG tablet Take 1 tablet (25 mg total) by mouth 2 (two) times daily with a meal. 180 tablet 3   dapagliflozin propanediol (FARXIGA) 10 MG TABS tablet Take 1 tablet (10 mg total) by mouth daily before breakfast. 30 tablet 6   ENTRESTO 49-51 MG TAKE 1 TABLET BY MOUTH TWICE A DAY 60 tablet 12   sertraline (ZOLOFT) 25 MG tablet Take 1 tablet (25 mg total) by mouth daily. 90 tablet 3   ticagrelor (BRILINTA) 60 MG TABS tablet TAKE 1 TABLET BY MOUTH 2 TIMES DAILY. 60 tablet 8   No facility-administered medications prior to visit.     Per HPI unless specifically indicated in ROS section below Review of Systems  Objective:  BP 122/64   Pulse 60   Temp (!) 97.3 F (36.3 C) (Temporal)   Ht 5' 1.75" (1.568 m)   Wt 121 lb 8 oz (55.1 kg)   SpO2 95%   BMI 22.40 kg/m   Wt Readings from Last 3 Encounters:  05/10/22 121 lb 8 oz (55.1 kg)  12/08/21 128 lb 2 oz (58.1 kg)  06/15/21 128 lb (58.1 kg)      Physical Exam Vitals and nursing note reviewed.  Constitutional:      Appearance: Normal appearance. She is not ill-appearing.  HENT:     Head: Normocephalic and atraumatic.  Mouth/Throat:     Mouth: Mucous membranes are moist.     Pharynx: Oropharynx is clear. No oropharyngeal exudate or posterior oropharyngeal erythema.  Eyes:     Extraocular Movements: Extraocular movements intact.     Conjunctiva/sclera: Conjunctivae normal.     Pupils: Pupils are equal, round, and reactive to light.  Cardiovascular:     Rate and Rhythm: Normal rate and regular rhythm.     Pulses: Normal pulses.     Heart sounds: Normal heart sounds. No murmur heard. Pulmonary:     Effort: Pulmonary effort is normal. No respiratory distress.     Breath sounds: Normal breath sounds. No wheezing, rhonchi or rales.  Abdominal:     General: Bowel sounds are normal. There is no distension.     Palpations: Abdomen is soft. There is no mass.     Tenderness: There is no abdominal tenderness. There is no right  CVA tenderness, left CVA tenderness, guarding or rebound.     Hernia: No hernia is present.  Musculoskeletal:     Right lower leg: No edema.     Left lower leg: No edema.  Skin:    General: Skin is warm and dry.     Findings: No rash.  Neurological:     Mental Status: She is alert.  Psychiatric:        Mood and Affect: Mood normal.        Behavior: Behavior normal.       Results for orders placed or performed in visit on 05/10/22  POCT glycosylated hemoglobin (Hb A1C)  Result Value Ref Range   Hemoglobin A1C 7.0 (A) 4.0 - 5.6 %   HbA1c POC (<> result, manual entry)     HbA1c, POC (prediabetic range)     HbA1c, POC (controlled diabetic range)      Assessment & Plan:   Problem List Items Addressed This Visit     Type 2 diabetes mellitus with peripheral neuropathy (HCC) - Primary   Relevant Orders   POCT glycosylated hemoglobin (Hb A1C) (Completed)   Other Visit Diagnoses     Urinary incontinence, unspecified type       Relevant Orders   POCT Urinalysis Dipstick (Automated)        No orders of the defined types were placed in this encounter.  Orders Placed This Encounter  Procedures   POCT glycosylated hemoglobin (Hb A1C)   POCT Urinalysis Dipstick (Automated)     ***Follow up plan: Return if symptoms worsen or fail to improve.  Ria Bush, MD

## 2022-05-10 NOTE — Patient Instructions (Addendum)
Llame para ver si es hora de hacer cita con cardiologo.  La llamaremos para revision de ojos aqui en clinica (diabetic retinopathy screen).  Examen de horina hoy. Regresar en 3 meses para fisico.  Gusto verla hoy!

## 2022-05-11 DIAGNOSIS — R32 Unspecified urinary incontinence: Secondary | ICD-10-CM | POA: Insufficient documentation

## 2022-05-11 MED ORDER — DAPAGLIFLOZIN PROPANEDIOL 5 MG PO TABS: 5.0000 mg | ORAL_TABLET | Freq: Every day | ORAL | 6 refills | Status: DC

## 2022-05-11 NOTE — Assessment & Plan Note (Addendum)
Appreciate cards care. She continues entresto, farxiga, brilinta and aspirin as well as coreg and lipitor.  Encourage cards f/u as last seen 12/2020.

## 2022-05-11 NOTE — Assessment & Plan Note (Addendum)
Chronic, improved control with A1c at 7% - congratulated.  Given endorsed urinary symptoms by family, will recommend dropping farxiga dose to '5mg'$  daily. Will also refer for in-house retinopathy screen.

## 2022-05-11 NOTE — Assessment & Plan Note (Signed)
Will need MMSE next visit.

## 2022-05-11 NOTE — Assessment & Plan Note (Signed)
GFR last checked 40s. Avoid metformin due to this. Update labs next visit.

## 2022-05-11 NOTE — Assessment & Plan Note (Addendum)
Family note episodes of urinary incontinence - patient denies. ?farxiga effect - check UA r/o UTI. Consider dropping farxiga dose.

## 2022-06-11 ENCOUNTER — Ambulatory Visit: Payer: Medicare Other

## 2022-06-16 ENCOUNTER — Ambulatory Visit (INDEPENDENT_AMBULATORY_CARE_PROVIDER_SITE_OTHER): Payer: Medicare Other

## 2022-06-16 VITALS — Ht 61.75 in | Wt 121.0 lb

## 2022-06-16 DIAGNOSIS — Z0000 Encounter for general adult medical examination without abnormal findings: Secondary | ICD-10-CM

## 2022-06-16 NOTE — Progress Notes (Signed)
Virtual Visit via Telephone Note  I connected with  April Short on 06/16/22 at  9:00 AM EDT by telephone and verified that I am speaking with the correct person using two identifiers.Talked to [place] her daughter and used interpreter   Location: Patient: home Provider: [place] Persons participating in the virtual visit: patient/Nurse Health Advisor   I discussed the limitations, risks, security and privacy concerns of performing an evaluation and management service by telephone and the availability of in person appointments. The patient expressed understanding and agreed to proceed.  Interactive audio and video telecommunications were attempted between this nurse and patient, however failed, due to patient having technical difficulties OR patient did not have access to video capability.  We continued and completed visit with audio only.  Some vital signs may be absent or patient reported.   Dionisio David, LPN  Subjective:   April Short is a 87 y.o. female who presents for Medicare Annual (Subsequent) preventive examination.  Review of Systems     Cardiac Risk Factors include: advanced age (>49mn, >>23women);diabetes mellitus     Objective:    There were no vitals filed for this visit. There is no height or weight on file to calculate BMI.     06/16/2022    9:23 AM  Advanced Directives  Does Patient Have a Medical Advance Directive? No  Would patient like information on creating a medical advance directive? No - Patient declined    Current Medications (verified) Outpatient Encounter Medications as of 06/16/2022  Medication Sig   albuterol (PROVENTIL) (2.5 MG/3ML) 0.083% nebulizer solution Take 3 mLs (2.5 mg total) by nebulization 3 (three) times daily as needed for wheezing or shortness of breath.   Alpha-Lipoic Acid 600 MG CAPS Take 1 capsule (600 mg total) by mouth daily.   aspirin EC 81 MG tablet Take 81 mg by mouth daily. Swallow whole.    atorvastatin (LIPITOR) 40 MG tablet Take 1 tablet (40 mg total) by mouth daily.   carvedilol (COREG) 25 MG tablet Take 1 tablet (25 mg total) by mouth 2 (two) times daily with a meal.   dapagliflozin propanediol (FARXIGA) 5 MG TABS tablet Take 1 tablet (5 mg total) by mouth daily.   ENTRESTO 49-51 MG TAKE 1 TABLET BY MOUTH TWICE A DAY   sertraline (ZOLOFT) 25 MG tablet Take 1 tablet (25 mg total) by mouth daily.   ticagrelor (BRILINTA) 60 MG TABS tablet TAKE 1 TABLET BY MOUTH 2 TIMES DAILY.   No facility-administered encounter medications on file as of 06/16/2022.    Allergies (verified) Patient has no known allergies.   History: Past Medical History:  Diagnosis Date   Anemia    Arthritis    CAD (coronary artery disease)    a. 10/2015 PCI ([facility]: OM (2.5x30 Resolute DES), LCX (3.5x12 Resolute DES).   Cervical cancer ([facility] [id number]  Chronic systolic CHF (congestive heart failure) ([facility]    a. 01/2016 Echo: EF 20-25%, diff HK, septal-lateral dyssynchrony. Mild MR, mod to sev dil LA, nl RV, mod dil RA, mod TR, PASP 535mg.   Colon [place]   [place]diabetes mellitus) ([facility]   History of chicken pox    HLD (hyperlipidemia)    Hypertensive heart disease    Ischemic cardiomyopathy    a. 01/2016 Echo: EF 20-25%.   LBBB (left bundle branch block)    Past Surgical History:  Procedure Laterality Date   COLON RESECTION  2001  colon cancer   COLONOSCOPY  11/2011   polyp x1, int hem ([place])   CORONARY STENT PLACEMENT     TOTAL ABDOMINAL HYSTERECTOMY  2005   cervical cancer   Family History  Problem Relation Age of Onset   Colon cancer Mother    Diabetes Sister    Stomach cancer Son    Breast cancer Neg Hx    Social History   Socioeconomic History   Marital status: Widowed    Spouse name: Not on file   Number of children: 7   Years of education: Not on file   Highest education level: Not on file  Occupational History   Not on file  Tobacco Use    Smoking status: Never   Smokeless tobacco: Never  Substance and Sexual Activity   Alcohol use: Yes    Alcohol/week: 0.0 standard drinks of alcohol    Comment: occasionally   Drug use: No   Sexual activity: Not on file  Other Topics Concern   Not on file  Social History Narrative   Widow 2018, lives with daughter   From [place], lived in [place] then [place].    Moved to [facility] 2021 to be closer to family    Occ: retired   Activity:    Diet:    Social Determinants of Radio broadcast assistant Strain: Low Risk  (06/16/2022)   Overall Financial Resource Strain (CARDIA)    Difficulty of Paying Living Expenses: Not hard at all  Food Insecurity: No Food Insecurity (06/16/2022)   Hunger Vital Sign    Worried About Running Out of Food in the Last Year: Never true    [place] in the Last Year: Never true  Transportation Needs: No Transportation Needs (06/16/2022)   PRAPARE - Hydrologist (Medical): No    Lack of Transportation (Non-Medical): No  Physical Activity: Insufficiently Active (06/16/2022)   Exercise Vital Sign    Days of Exercise per Week: 3 days    Minutes of Exercise per Session: 30 min  Stress: No Stress Concern Present (06/16/2022)   [place]    Feeling of Stress : Not at all  Social Connections: Socially Isolated (06/16/2022)   Social Connection and Isolation Panel [NHANES]    Frequency of Communication with Friends and Family: More than three times a week    Frequency of Social Gatherings with Friends and Family: Three times a week    Attends Religious Services: Never    Active Member of Clubs or Organizations: No    Attends Archivist Meetings: Never    Marital Status: Widowed    Tobacco Counseling Counseling given: Not Answered   Clinical Intake:  Pre-visit preparation completed: Yes  Pain : No/denies pain     Nutritional Risks: None Diabetes: Yes CBG  done?: No Did pt. bring in CBG monitor from home?: No  How often do you need to have someone help you when you read instructions, pamphlets, or other written materials from your doctor or pharmacy?: 5 - Always  Diabetic?yes Nutrition Risk Assessment:  Has the patient had any N/V/D within the last 2 months?  No  Does the patient have any non-healing wounds?  No  Has the patient had any unintentional weight loss or weight gain?  No   Diabetes:  Is the patient diabetic?  Yes  If diabetic, was a CBG obtained today?  No  Did the patient  bring in their glucometer from home?  No  How often do you monitor your CBG's? occasionally.   Financial Strains and Diabetes Management:  Are you having any financial strains with the device, your supplies or your medication? No .  Does the patient want to be seen by Chronic Care Management for management of their diabetes?  No  Would the patient like to be referred to a Nutritionist or for Diabetic Management?  No   Diabetic Exams:  Diabetic Eye Exam: Completed no. Overdue for diabetic eye exam. Pt has been advised about the importance in completing this exam.   Diabetic Foot Exam: Completed 10/01/21. Pt has been advised about the importance in completing this exam.   Interpreter Needed?: Yes Interpreter Agency: [employer] Interpreter Name: Trinda Pascal ID: [id number] Patient Declined Interpreter : No Patient signed [place] waiver: No  Information entered by :: Kirke Shaggy, LPN   Activities of Daily Living    06/16/2022    9:24 AM  In your present state of health, do you have any difficulty performing the following activities:  Hearing? 0  Vision? 0  Difficulty concentrating or making decisions? 0  Walking or climbing stairs? 0  Dressing or bathing? 0  Doing errands, shopping? 0  Preparing Food and eating ? N  Using the Toilet? N  In the past six months, have you accidently leaked urine? N  Do you have problems with  loss of bowel control? N  Managing your Medications? N  Managing your Finances? N  Housekeeping or managing your Housekeeping? N    Patient Care Team: Ria Bush, MD as PCP - General (Family Medicine) Lorretta Harp, MD as PCP - Cardiology (Cardiology)  Indicate any recent Medical Services you may have received from other than [facility] providers in the past year (date may be approximate).     Assessment:   This is a routine wellness examination for [place].  Hearing/Vision screen Hearing Screening - Comments:: No aids Vision Screening - Comments:: Wears glasses to read  Dietary issues and exercise activities discussed: Current Exercise Habits: Home exercise routine, Type of exercise: walking, Time (Minutes): 30, Frequency (Times/Week): 3, Weekly Exercise (Minutes/Week): 90, Intensity: Mild   Goals Addressed             This Visit's Progress    DIET - EAT MORE FRUITS AND VEGETABLES       DIET - EAT MORE FRUITS AND VEGETABLES         Depression Screen    06/16/2022    9:17 AM 06/15/2021    5:29 PM 12/05/2020   11:16 AM 08/05/2020   11:05 AM 01/14/2016    3:12 PM  PHQ 2/9 Scores  PHQ - 2 Score 0 '2 3 3 6  '$ PHQ- 9 Score 0 '6 11 10 21    '$ Fall Risk    06/16/2022    9:24 AM 06/15/2021    4:04 PM 01/14/2016    3:12 PM  Fall Risk   Falls in the past year? 0 0 No  Number falls in past yr: 0    Injury with Fall? 0    Risk for fall due to : No Fall Risks    Follow up Falls prevention discussed;Falls evaluation completed      FALL RISK PREVENTION PERTAINING TO THE HOME:  Any stairs in or around the home? Yes  If so, are there any without handrails? No  Home free of loose throw rugs in walkways, pet beds, electrical cords,  etc? Yes  Adequate lighting in your home to reduce risk of falls? Yes   ASSISTIVE DEVICES UTILIZED TO PREVENT FALLS:  Life alert? No  Use of a cane, walker or w/c? No  Grab bars in the bathroom? No  Shower chair or bench in shower? No   Elevated toilet seat or a handicapped toilet? No    Cognitive Function:        06/16/2022    9:27 AM  6CIT Screen  What Year? 0 points  What month? 3 points  What time? 0 points  Count back from 20 0 points  Months in reverse 4 points  Repeat phrase 10 points  Total Score 17 points    Immunizations Immunization History  Administered Date(s) Administered   Fluad Quad(high Dose 65+) 07/11/2020, 06/15/2021   Influenza,inj,Quad PF,6+ Mos 10/21/2016   Moderna Covid-19 Vaccine Bivalent Booster 70yr & up 11/02/2021   Moderna SARS-COV2 Booster Vaccination 08/28/2020   Moderna Sars-Covid-2 Vaccination 10/15/2019, 11/12/2019    TDAP status: Due, Education has been provided regarding the importance of this vaccine. Advised may receive this vaccine at local pharmacy or Health Dept. Aware to provide a copy of the vaccination record if obtained from local pharmacy or Health Dept. Verbalized acceptance and understanding.  Flu Vaccine status: Up to date  Pneumococcal vaccine status: Due, Education has been provided regarding the importance of this vaccine. Advised may receive this vaccine at local pharmacy or Health Dept. Aware to provide a copy of the vaccination record if obtained from local pharmacy or Health Dept. Verbalized acceptance and understanding.  Covid-19 vaccine status: Completed vaccines  Qualifies for Shingles Vaccine? Yes   Zostavax completed No   Shingrix Completed?: No.    Education has been provided regarding the importance of this vaccine. Patient has been advised to call insurance company to determine out of pocket expense if they have not yet received this vaccine. Advised may also receive vaccine at local pharmacy or Health Dept. Verbalized acceptance and understanding.  Screening Tests Health Maintenance  Topic Date Due   OPHTHALMOLOGY EXAM  Never done   TETANUS/TDAP  Never done   Zoster Vaccines- Shingrix (1 of 2) Never done   Pneumonia Vaccine 85 Years old  (1 - PCV) Never done   DEXA SCAN  Never done   COVID-19 Vaccine (4 - Moderna series) 03/02/2022   INFLUENZA VACCINE  04/20/2022   FOOT EXAM  10/01/2022   HEMOGLOBIN A1C  11/10/2022   Diabetic kidney evaluation - GFR measurement  12/09/2022   Diabetic kidney evaluation - Urine ACR  12/09/2022   HPV VACCINES  Aged Out    Health Maintenance  Health Maintenance Due  Topic Date Due   OPHTHALMOLOGY EXAM  Never done   TETANUS/TDAP  Never done   Zoster Vaccines- Shingrix (1 of 2) Never done   Pneumonia Vaccine 85 Years old (1 - PCV) Never done   DEXA SCAN  Never done   COVID-19 Vaccine (4 - Moderna series) 03/02/2022   INFLUENZA VACCINE  04/20/2022    Colorectal cancer screening: Type of screening: Colonoscopy. Completed 11/26/11. Repeat every 10 years- declined referral  Mammogram status: Completed 01/12/22. Repeat every year  Bone Density status: Completed 06/22/22 scheduled. Results reflect: Bone density results: NORMAL. Repeat every 5 years.  Lung Cancer Screening: (Low Dose CT Chest recommended if Age 85-80years, 30 pack-year currently smoking OR have quit w/in 15years.) does not qualify.    Additional Screening:  Hepatitis C Screening: does not qualify; Completed no  Vision Screening: Recommended annual ophthalmology exams for early detection of glaucoma and other disorders of the eye. Is the patient up to date with their annual eye exam?  No  Who is the provider or what is the name of the office in which the patient attends annual eye exams? No one If pt is not established with a provider, would they like to be referred to a provider to establish care? No .   Dental Screening: Recommended annual dental exams for proper oral hygiene  Community Resource Referral / Chronic Care Management: CRR required this visit?  No   CCM required this visit?  No      Plan:     I have personally reviewed and noted the following in the patient's chart:   Medical and social  history Use of alcohol, tobacco or illicit drugs  Current medications and supplements including opioid prescriptions. Patient is not currently taking opioid prescriptions. Functional ability and status Nutritional status Physical activity Advanced directives List of other physicians Hospitalizations, surgeries, and ER visits in previous 12 months Vitals Screenings to include cognitive, depression, and falls Referrals and appointments  In addition, I have reviewed and discussed with patient certain preventive protocols, quality metrics, and best practice recommendations. A written personalized care plan for preventive services as well as general preventive health recommendations were provided to patient.     Dionisio David, LPN   01/06/6221   Nurse Notes: none

## 2022-06-16 NOTE — Patient Instructions (Signed)
April Short , Thank you for taking time to come for your Medicare Wellness Visit. I appreciate your ongoing commitment to your health goals. Please review the following plan we discussed and let me know if I can assist you in the future.   Screening recommendations/referrals: Colonoscopy: declined referral Mammogram: 01/12/22 Bone Density: 06/22/22 scheduled Recommended yearly ophthalmology/optometry visit for glaucoma screening and checkup Recommended yearly dental visit for hygiene and checkup  Vaccinations: Influenza vaccine: 06/15/21 Pneumococcal vaccine: n/d Tdap vaccine: n/d Shingles vaccine: n/d   Covid-19:10/15/19, 11/12/19, 08/28/20, 11/02/21  Advanced directives: no  Conditions/risks identified: none  Next appointment: Follow up in one year for your annual wellness visit 06/20/23 @ 9 am by phone   Preventive Care 65 Years and Older, Female Preventive care refers to lifestyle choices and visits with your health care provider that can promote health and wellness. What does preventive care include? A yearly physical exam. This is also called an annual well check. Dental exams once or twice a year. Routine eye exams. Ask your health care provider how often you should have your eyes checked. Personal lifestyle choices, including: Daily care of your teeth and gums. Regular physical activity. Eating a healthy diet. Avoiding tobacco and drug use. Limiting alcohol use. Practicing safe sex. Taking low-dose aspirin every day. Taking vitamin and mineral supplements as recommended by your health care provider. What happens during an annual well check? The services and screenings done by your health care provider during your annual well check will depend on your age, overall health, lifestyle risk factors, and family history of disease. Counseling  Your health care provider may ask you questions about your: Alcohol use. Tobacco use. Drug use. Emotional well-being. Home and  relationship well-being. Sexual activity. Eating habits. History of falls. Memory and ability to understand (cognition). Work and work Statistician. Reproductive health. Screening  You may have the following tests or measurements: Height, weight, and BMI. Blood pressure. Lipid and cholesterol levels. These may be checked every 5 years, or more frequently if you are over 32 years old. Skin check. Lung cancer screening. You may have this screening every year starting at age 34 if you have a 30-pack-year history of smoking and currently smoke or have quit within the past 15 years. Fecal occult blood test (FOBT) of the stool. You may have this test every year starting at age 72. Flexible sigmoidoscopy or colonoscopy. You may have a sigmoidoscopy every 5 years or a colonoscopy every 10 years starting at age 67. Hepatitis C blood test. Hepatitis B blood test. Sexually transmitted disease (STD) testing. Diabetes screening. This is done by checking your blood sugar (glucose) after you have not eaten for a while (fasting). You may have this done every 1-3 years. Bone density scan. This is done to screen for osteoporosis. You may have this done starting at age 24. Mammogram. This may be done every 1-2 years. Talk to your health care provider about how often you should have regular mammograms. Talk with your health care provider about your test results, treatment options, and if necessary, the need for more tests. Vaccines  Your health care provider may recommend certain vaccines, such as: Influenza vaccine. This is recommended every year. Tetanus, diphtheria, and acellular pertussis (Tdap, Td) vaccine. You may need a Td booster every 10 years. Zoster vaccine. You may need this after age 73. Pneumococcal 13-valent conjugate (PCV13) vaccine. One dose is recommended after age 92. Pneumococcal polysaccharide (PPSV23) vaccine. One dose is recommended after age 70. Talk to  your health care provider  about which screenings and vaccines you need and how often you need them. This information is not intended to replace advice given to you by your health care provider. Make sure you discuss any questions you have with your health care provider. Document Released: 10/03/2015 Document Revised: 05/26/2016 Document Reviewed: 07/08/2015 Elsevier Interactive Patient Education  2017 [place] Prevention in the Home Falls can cause injuries. They can happen to people of all ages. There are many things you can do to make your home safe and to help prevent falls. What can I do on the outside of my home? Regularly fix the edges of walkways and driveways and fix any cracks. Remove anything that might make you trip as you walk through a door, such as a raised step or threshold. Trim any bushes or trees on the path to your home. Use bright outdoor lighting. Clear any walking paths of anything that might make someone trip, such as rocks or tools. Regularly check to see if handrails are loose or broken. Make sure that both sides of any steps have handrails. Any raised decks and porches should have guardrails on the edges. Have any leaves, snow, or ice cleared regularly. Use sand or salt on walking paths during winter. Clean up any spills in your garage right away. This includes oil or grease spills. What can I do in the bathroom? Use night lights. Install grab bars by the toilet and in the tub and shower. Do not use towel bars as grab bars. Use non-skid mats or decals in the tub or shower. If you need to sit down in the shower, use a plastic, non-slip stool. Keep the floor dry. Clean up any water that spills on the floor as soon as it happens. Remove soap buildup in the tub or shower regularly. Attach bath mats securely with double-sided non-slip rug tape. Do not have throw rugs and other things on the floor that can make you trip. What can I do in the bedroom? Use night lights. Make sure  that you have a light by your bed that is easy to reach. Do not use any sheets or blankets that are too big for your bed. They should not hang down onto the floor. Have a firm chair that has side arms. You can use this for support while you get dressed. Do not have throw rugs and other things on the floor that can make you trip. What can I do in the kitchen? Clean up any spills right away. Avoid walking on wet floors. Keep items that you use a lot in easy-to-reach places. If you need to reach something above you, use a strong step stool that has a grab bar. Keep electrical cords out of the way. Do not use floor polish or wax that makes floors slippery. If you must use wax, use non-skid floor wax. Do not have throw rugs and other things on the floor that can make you trip. What can I do with my stairs? Do not leave any items on the stairs. Make sure that there are handrails on both sides of the stairs and use them. Fix handrails that are broken or loose. Make sure that handrails are as long as the stairways. Check any carpeting to make sure that it is firmly attached to the stairs. Fix any carpet that is loose or worn. Avoid having throw rugs at the top or bottom of the stairs. If you do have throw rugs, attach  them to the floor with carpet tape. Make sure that you have a light switch at the top of the stairs and the bottom of the stairs. If you do not have them, ask someone to add them for you. What else can I do to help prevent falls? Wear shoes that: Do not have high heels. Have rubber bottoms. Are comfortable and fit you well. Are closed at the toe. Do not wear sandals. If you use a stepladder: Make sure that it is fully opened. Do not climb a closed stepladder. Make sure that both sides of the stepladder are locked into place. Ask someone to hold it for you, if possible. Clearly mark and make sure that you can see: Any grab bars or handrails. First and last steps. Where the edge of  each step is. Use tools that help you move around (mobility aids) if they are needed. These include: Canes. Walkers. Scooters. Crutches. Turn on the lights when you go into a dark area. Replace any light bulbs as soon as they burn out. Set up your furniture so you have a clear path. Avoid moving your furniture around. If any of your floors are uneven, fix them. If there are any pets around you, be aware of where they are. Review your medicines with your doctor. Some medicines can make you feel dizzy. This can increase your chance of falling. Ask your doctor what other things that you can do to help prevent falls. This information is not intended to replace advice given to you by your health care provider. Make sure you discuss any questions you have with your health care provider. Document Released: 07/03/2009 Document Revised: 02/12/2016 Document Reviewed: 10/11/2014 Elsevier Interactive Patient Education  2017 [employer].

## 2022-06-22 ENCOUNTER — Ambulatory Visit
Admission: RE | Admit: 2022-06-22 | Discharge: 2022-06-22 | Disposition: A | Discharge status: Home / Self Care | Payer: Medicare Other | Source: Ambulatory Visit | Attending: Family Medicine | Admitting: Family Medicine

## 2022-06-22 ENCOUNTER — Other Ambulatory Visit: Payer: Self-pay | Admitting: Family Medicine

## 2022-06-22 DIAGNOSIS — Z780 Asymptomatic menopausal state: Secondary | ICD-10-CM | POA: Diagnosis not present

## 2022-06-22 DIAGNOSIS — E1169 Type 2 diabetes mellitus with other specified complication: Secondary | ICD-10-CM

## 2022-06-22 DIAGNOSIS — M810 Age-related osteoporosis without current pathological fracture: Secondary | ICD-10-CM | POA: Diagnosis not present

## 2022-06-22 DIAGNOSIS — E2839 Other primary ovarian failure: Secondary | ICD-10-CM

## 2022-06-22 DIAGNOSIS — I110 Hypertensive heart disease with heart failure: Secondary | ICD-10-CM

## 2022-06-22 DIAGNOSIS — F4321 Adjustment disorder with depressed mood: Secondary | ICD-10-CM

## 2022-06-23 ENCOUNTER — Encounter: Payer: Self-pay | Admitting: Family Medicine

## 2022-06-23 DIAGNOSIS — M810 Age-related osteoporosis without current pathological fracture: Secondary | ICD-10-CM | POA: Insufficient documentation

## 2022-07-02 ENCOUNTER — Encounter: Payer: Self-pay | Admitting: Family Medicine

## 2022-07-02 ENCOUNTER — Ambulatory Visit (INDEPENDENT_AMBULATORY_CARE_PROVIDER_SITE_OTHER): Payer: Medicare Other | Admitting: Family Medicine

## 2022-07-02 ENCOUNTER — Telehealth: Payer: Self-pay | Admitting: Family Medicine

## 2022-07-02 VITALS — BP 120/64 | HR 65 | Temp 97.3°F | Ht 61.75 in | Wt 123.4 lb

## 2022-07-02 DIAGNOSIS — N183 Chronic kidney disease, stage 3 unspecified: Secondary | ICD-10-CM

## 2022-07-02 DIAGNOSIS — M810 Age-related osteoporosis without current pathological fracture: Secondary | ICD-10-CM | POA: Diagnosis not present

## 2022-07-02 DIAGNOSIS — Z23 Encounter for immunization: Secondary | ICD-10-CM | POA: Diagnosis not present

## 2022-07-02 DIAGNOSIS — E1122 Type 2 diabetes mellitus with diabetic chronic kidney disease: Secondary | ICD-10-CM

## 2022-07-02 LAB — CBC WITH DIFFERENTIAL/PLATELET
Basophils Absolute: 0 10*3/uL (ref 0.0–0.1)
Basophils Relative: 0.7 % (ref 0.0–3.0)
Eosinophils Absolute: 0.4 10*3/uL (ref 0.0–0.7)
Eosinophils Relative: 6.8 % — ABNORMAL HIGH (ref 0.0–5.0)
HCT: 35 % — ABNORMAL LOW (ref 36.0–46.0)
Hemoglobin: 11.6 g/dL — ABNORMAL LOW (ref 12.0–15.0)
Lymphocytes Relative: 20.7 % (ref 12.0–46.0)
Lymphs Abs: 1.4 10*3/uL (ref 0.7–4.0)
MCHC: 33.1 g/dL (ref 30.0–36.0)
MCV: 87.9 fl (ref 78.0–100.0)
Monocytes Absolute: 0.6 10*3/uL (ref 0.1–1.0)
Monocytes Relative: 9 % (ref 3.0–12.0)
Neutro Abs: 4.1 10*3/uL (ref 1.4–7.7)
Neutrophils Relative %: 62.8 % (ref 43.0–77.0)
Platelets: 171 10*3/uL (ref 150.0–400.0)
RBC: 3.98 Mil/uL (ref 3.87–5.11)
RDW: 15.5 % (ref 11.5–15.5)
WBC: 6.6 10*3/uL (ref 4.0–10.5)

## 2022-07-02 LAB — RENAL FUNCTION PANEL
Albumin: 4 g/dL (ref 3.5–5.2)
BUN: 21 mg/dL (ref 6–23)
CO2: 27 mEq/L (ref 19–32)
Calcium: 9.5 mg/dL (ref 8.4–10.5)
Chloride: 103 mEq/L (ref 96–112)
Creatinine, Ser: 0.97 mg/dL (ref 0.40–1.20)
GFR: 53.22 mL/min — ABNORMAL LOW (ref >60.00)
Glucose, Bld: 182 mg/dL — ABNORMAL HIGH (ref 70–99)
Phosphorus: 3.8 mg/dL (ref 2.3–4.6)
Potassium: 4.2 mEq/L (ref 3.5–5.1)
Sodium: 139 mEq/L (ref 135–145)

## 2022-07-02 LAB — VITAMIN D 25 HYDROXY (VIT D DEFICIENCY, FRACTURES): VITD: 28.59 ng/mL — ABNORMAL LOW (ref 30.00–100.00)

## 2022-07-02 LAB — TSH: TSH: 1.53 u[IU]/mL (ref 0.35–5.50)

## 2022-07-02 MED ORDER — VITAMIN D3 25 MCG (1000 UT) PO CAPS: 1.0000 | ORAL_CAPSULE | Freq: Every day | ORAL | Status: DC

## 2022-07-02 NOTE — Progress Notes (Addendum)
Patient ID: April Short, female    DOB: 08-29-1935, 87 y.o.   MRN: [id number]  This visit was conducted in person.  BP 120/64   Pulse 65   Temp (!) 97.3 F (36.3 C) (Temporal)   Ht 5' 1.75" (1.568 m)   Wt 123 lb 6 oz (56 kg)   SpO2 95%   BMI 22.75 kg/m    CC: osteoporosis management  Subjective:   HPI: April Short is a 87 y.o. female presenting on 07/02/2022 for Osteoporosis (Here to discuss tx options.  Pt accompanied by daughters, Regino Schultze and Constance Holster. )  Recent DEXA 06/2022 - T -4.4 R forearm, LFT -3.2 H/o left wrist fracture 2000s after fall.  CKD stage 3 with GFR 40s.      Relevant past medical, surgical, family and social history reviewed and updated as indicated. Interim medical history since our last visit reviewed. Allergies and medications reviewed and updated. Outpatient Medications Prior to Visit  Medication Sig Dispense Refill   albuterol (PROVENTIL) (2.5 MG/3ML) 0.083% nebulizer solution Take 3 mLs (2.5 mg total) by nebulization 3 (three) times daily as needed for wheezing or shortness of breath. 75 mL 1   Alpha-Lipoic Acid 600 MG CAPS Take 1 capsule (600 mg total) by mouth daily.     aspirin EC 81 MG tablet Take 81 mg by mouth daily. Swallow whole.     atorvastatin (LIPITOR) 40 MG tablet TAKE 1 TABLET BY MOUTH EVERY DAY 90 tablet 0   carvedilol (COREG) 25 MG tablet TAKE 1 TABLET (25 MG TOTAL) BY MOUTH 2 (TWO) TIMES DAILY WITH A MEAL. 180 tablet 0   dapagliflozin propanediol (FARXIGA) 5 MG TABS tablet Take 1 tablet (5 mg total) by mouth daily. 30 tablet 6   ENTRESTO 49-51 MG TAKE 1 TABLET BY MOUTH TWICE A DAY 60 tablet 12   sertraline (ZOLOFT) 25 MG tablet TAKE 1 TABLET (25 MG TOTAL) BY MOUTH DAILY. 90 tablet 0   ticagrelor (BRILINTA) 60 MG TABS tablet TAKE 1 TABLET BY MOUTH 2 TIMES DAILY. 60 tablet 8   No facility-administered medications prior to visit.     Per HPI unless specifically indicated in ROS section below Review of Systems  Objective:   BP 120/64   Pulse 65   Temp (!) 97.3 F (36.3 C) (Temporal)   Ht 5' 1.75" (1.568 m)   Wt 123 lb 6 oz (56 kg)   SpO2 95%   BMI 22.75 kg/m   Wt Readings from Last 3 Encounters:  07/02/22 123 lb 6 oz (56 kg)  06/16/22 121 lb (54.9 kg)  05/10/22 121 lb 8 oz (55.1 kg)      Physical Exam Vitals and nursing note reviewed.  Constitutional:      Appearance: Normal appearance. She is not ill-appearing.  Neurological:     Mental Status: She is alert.       Results for orders placed or performed in visit on 05/10/22  POCT glycosylated hemoglobin (Hb A1C)  Result Value Ref Range   Hemoglobin A1C 7.0 (A) 4.0 - 5.6 %   HbA1c POC (<> result, manual entry)     HbA1c, POC (prediabetic range)     HbA1c, POC (controlled diabetic range)    POCT Urinalysis Dipstick (Automated)  Result Value Ref Range   Color, UA yellow    Clarity, UA cloudy    Glucose, UA Positive (A) Negative   Bilirubin, UA negative    Ketones, UA negative    Spec Grav,  UA 1.015 1.010 - 1.025   Blood, UA negative    pH, UA 5.0 5.0 - 8.0   Protein, UA Negative Negative   Urobilinogen, UA 0.2 0.2 or 1.0 E.U./dL   Nitrite, UA negative    Leukocytes, UA Negative Negative   Lab Results  Component Value Date   CREATININE 1.18 12/08/2021   BUN 34 (H) 12/08/2021   NA 135 12/08/2021   K 5.2 (H) 12/08/2021   CL 100 12/08/2021   CO2 26 12/08/2021    Assessment & Plan:   Problem List Items Addressed This Visit     CKD stage 3 due to type 2 diabetes mellitus ([place])   Relevant Orders   VITAMIN D 25 Hydroxy (Vit-D Deficiency, Fractures)   CBC with Differential/Platelet   Renal function panel   Osteoporosis - Primary    Reviewed recent DEXA scan, reviewed diagnosis of osteoporosis as well as pathophysiology and possible treatment options. Reviewed ideally most calcium in diet given CAD hx, as well as vitamin D and regular weight bearing exercises.  Discussed bisphosphonate and prolia medication options as well as  possible side effects and adverse events including atypical fracture.  They are interested in prolia - will price out and be in touch with cost estimate. Check labs today.       Relevant Medications   Cholecalciferol (VITAMIN D3) 25 MCG (1000 UT) CAPS   Other Relevant Orders   VITAMIN D 25 Hydroxy (Vit-D Deficiency, Fractures)   TSH   Other Visit Diagnoses     Need for influenza vaccination       Relevant Orders   Flu Vaccine QUAD High Dose(Fluad) (Completed)        Meds ordered this encounter  Medications   Cholecalciferol (VITAMIN D3) 25 MCG (1000 UT) CAPS    Sig: Take 1 capsule (1,000 Units total) by mouth daily.    Dispense:  30 capsule   Orders Placed This Encounter  Procedures   Flu Vaccine QUAD High Dose(Fluad)   VITAMIN D 25 Hydroxy (Vit-D Deficiency, Fractures)   TSH   CBC with Differential/Platelet   Renal function panel    Patient instructions: Densitometria demostro osteoporosis.  Recomiendo 1000-2000 unidades diarias de vitamina D.  Recomiendo '1200mg'$  decalcio diarios - idealmente todo en la dieta.  Investigaremos precio de inyeccion Prolia cada 6 meses. Si muy caro, podemos comenza pastilla Fosamax semanal.   Follow up plan: Return if symptoms worsen or fail to improve.  Ria Bush, MD

## 2022-07-02 NOTE — Telephone Encounter (Signed)
Benefits submitted-pending

## 2022-07-02 NOTE — Assessment & Plan Note (Signed)
Reviewed recent DEXA scan, reviewed diagnosis of osteoporosis as well as pathophysiology and possible treatment options. Reviewed ideally most calcium in diet given CAD hx, as well as vitamin D and regular weight bearing exercises.  Discussed bisphosphonate and prolia medication options as well as possible side effects and adverse events including atypical fracture.  They are interested in prolia - will price out and be in touch with cost estimate. Check labs today.

## 2022-07-02 NOTE — Telephone Encounter (Signed)
Can we price out Prolia for patient? May contact one of her daughters as she's spanish speaking only.

## 2022-07-02 NOTE — Addendum Note (Signed)
Addended by: Brenton Grills on: 89/38/1017 11:05 AM   Modules accepted: Orders

## 2022-07-02 NOTE — Patient Instructions (Addendum)
Densitometria demostro osteoporosis.  Recomiendo 1000-2000 unidades diarias de vitamina D.  Recomiendo 1236m decalcio diarios - idealmente todo en la dieta.  Investigaremos precio de inyeccion Prolia cada 6 meses. Si muy caro, podemos comenza pastilla Fosamax semanal.   Osteoporosis Osteoporosis  La osteoporosis se produce cuando los huesos se vuelven ms dbiles y mGenworth Financialde lo normal. La osteoporosis hace que los huesos se vuelvan ms quebradizos y frgiles, y ms propensos a romperse (fracturarse). Con el tiempo, la osteoporosis puede hacer que sus huesos se vuelvan tan dbiles que se fracturan con una cada insignificante. Los hContinental Airlinesson ms propensos a las fracturas debido a la osteoporosis son los de la cadera, las muecas y la columna vertebral. Cules son las causas? Se desconoce la causa exacta de esta afeccin. Qu incrementa el riesgo? Es ms probable que desarrolle esta afeccin si: Tiene familiares que tAdministrator, Civil Service Tiene un dficit nutricional. UCanadalo siguiente: Medicamentos con corticoesteroides, como la prednisona. Medicamentos anticonvulsivos. Nicotina o tabaco, como cigarrillos, cigarrillos electrnicos y tabaco de mHigher education careers adviser Es mujer. Tiene 50 aos o ms. No lleva una vida fsicamente activa (es sedentario). Tiene ascendencia europea o asitica. Es de contextura pequea. Cules son los signos o sntomas? UMariana Kaufmanpuede ser el primer signo de osteoporosis, en especial si la fractura se origina a causa de una cada o lesin que, por lo general, no provocara una fractura de hueso. Otros signos y sntomas incluyen los siguientes: Dolor en el cuello o la zona lumbar. Postura encorvada. Prdida de la altura. Cmo se diagnostica? Esta afeccin se puede diagnosticar en funcin de lo siguiente: Sus antecedentes mdicos. Un examen fsico. UArdelia Memsprueba de densidad mineral sea, tambin llamada prueba de DXA o DEXA (absorciometra con rayos X de doble  energa). Este estudio utiliza rayos X para medir la cantidad de mAK [employer] Corporation Cmo se trata? El tratamiento para esta afeccin puede incluir lo siguiente: Cambios de estilo de vida, como: Incluir alimentos con una mayor cantidad de calcio y vitamina D en su dieta. Hacer ejercicios de levantamiento de pesas y fortalecimiento muscular. Dejar de consumir tabaco. Limitar el consumo de bebidas alcohlicas. Tomar medicamentos para retrasar el proceso de prdida sea o aumentar la densidad sea. Tomar diariamente suplementos de calcio y vitamina D. Tomar medicamentos de reemplazo hormonal, como estrgeno en las mujeres y tLand O'Lakeshombres. Controlar los niveles de calcio y vitamina D. El objetivo del tratamiento es fortalecer los hCrosby a fin de disminuir el riesgo de fracturas. Siga estas instrucciones en su casa: Comida y bebida ITongacalcio y vitamina D en su dieta. El calcio es importante para la salud sea, y la vitamina D ayuda a su organismo a aAstronomercalcio. Las Decaire aHerndonde calcio y vitamina D incluyen: Ciertos pescados grasos, como el salmn y eLeisure centre manager Productos que contengan calcio y vitamina D agregados (estn fortificados), como los cereales fortificados. Yemas de huevo. Queso. Hgado.  Actividad Haga ejercicio como se lo haya indicado el mdico. Consulte al mdico qu ejercicios y actividades son seguros para usted. Debera hacer: Ejercicios que lo hagan trabajar contra la gravedad (ejercicios en los que se soporta peso), como el tai chi, yoga o caminar. Ejercicios para fortalecer los msculos, como levantar pesas. Estilo de vida No beba alcohol si: Su mdico le indica no hacerlo. Est embarazada, puede estar embarazada o est tratando de qBotswana Si bebe alcohol: Limite la cantidad que bebe: De 0 a 1 medida por da para las mujeres.  De 0 a 2 medidas por da para los hombres. Sepa cunta cantidad de alcohol hay en las bebidas  que toma. En los [place], una medida equivale a una botella de cerveza de 12 oz (355 ml), un vaso de vino de 5 oz (148 ml) o un vaso de una bebida alcohlica de alta graduacin de 1 oz (44 ml). No consuma ningn producto que contenga nicotina o tabaco, como cigarrillos, cigarrillos electrnicos y tabaco de Higher education careers adviser. Si necesita ayuda para dejar de consumir estos productos, consulte al [employer]. Prevencin de cadas Use dispositivos que lo ayuden a trasladarse (dispositivos de ayuda para la movilidad), segn los necesite, como bastones, andadores, patinetes con soporte para el pie o muletas. Mantenga las habitaciones bien iluminadas y libres de obstculos. Retire todos los [place] puedan causar tropiezos de los pasillos, tales como alfombras o cables. Instale barras para sostn en los baos y barandas de seguridad en las escaleras. Use tapetes de caucho en el bao y en otros sectores que a menudo estn mojados o son resbaladizos. Use calzado [employer] dedos que le quede bien y [employer]. Use calzado con suela de goma o taco bajo. Revise los medicamentos con el mdico. Algunos medicamentos pueden causar mareos o cambios en la presin arterial, lo que puede aumentar el riesgo de cadas. Indicaciones generales Use los medicamentos de venta libre y los recetados solamente como se lo haya indicado el mdico. Cumpla con todas las visitas de seguimiento. Esto es importante. Comunquese con un mdico si: Nunca ha sido evaluado para detectar osteoporosis y usted es: Dalene Seltzer mayor de 23 aos de edad. Un hombre mayor de 32 aos de edad. Solicite ayuda de inmediato si: Sufre una cada o lesin. Resumen La osteoporosis es el debilitamiento y la prdida de la densidad en los [place]. Esto hace que los huesos se vuelvan ms quebradizos y frgiles, y ms propensos a romperse (fracturarse),incluso con las cadas insignificantes. El objetivo del tratamiento es fortalecer los [place], a fin  de disminuir el riesgo de fracturas. Incluya calcio y vitamina D en su dieta. El calcio es importante para la salud sea, y la vitamina D ayuda a su organismo a Astronomer calcio. Hable con su mdico sobre realizar pruebas para la deteccin de la osteoporosis si es [place] y es mayor de 40 aos de edad, o un hombre y mayor de 70 aos de edad. Esta informacin no tiene Marine scientist el consejo del mdico. Asegrese de hacerle al mdico cualquier pregunta que tenga. Document Revised: 04/08/2020 Document Reviewed: 04/08/2020 Elsevier Patient Education  [place].  Denosumab Injection (Osteoporosis) What is this medication? DENOSUMAB (den oh SUE mab) prevents and treats osteoporosis. It works by Paramedic stronger and less likely to break (fracture). It is a monoclonal antibody. This medicine may be used for other purposes; ask your health care provider or pharmacist if you have questions. COMMON BRAND NAME(S): Prolia What should I tell my care team before I take this medication? They need to know if you have any of these conditions: Dental or gum disease, or plan to have dental surgery or a tooth pulled Infection Kidney disease Low levels of calcium or vitamin D in your blood On dialysis Poor nutrition Skin conditions Thyroid disease, or have had thyroid or parathyroid surgery Trouble absorbing minerals in your stomach or intestine An unusual reaction to denosumab, other medications, foods, dyes, or preservatives Pregnant or trying to get pregnant Breast-feeding How should I use  this medication? This medication is injected under the skin. It is given by your care team in a hospital or clinic setting. A special MedGuide will be given to you before each treatment. Be sure to read this information carefully each time. Talk to your care team about the use of this medication in children. Special care may be needed. Overdosage: If you think you have taken too much of this  medicine contact a poison control center or emergency room at once. NOTE: This medicine is only for you. Do not share this medicine with others. What if I miss a dose? Keep appointments for follow-up doses. It is important not to miss your dose. Call your care team if you are unable to keep an appointment. What may interact with this medication? Do not take this medication with any of the following: Other medications that contain denosumab This medication may also interact with the following: Medications that lower your chance of fighting infection Steroid medications, such as prednisone or cortisone This list may not describe all possible interactions. Give your health care provider a list of all the medicines, herbs, non-prescription drugs, or dietary supplements you use. Also tell them if you smoke, drink alcohol, or use illegal drugs. Some items may interact with your medicine. What should I watch for while using this medication? Your condition will be monitored carefully while you are receiving this medication. You may need blood work while taking this medication. This medication may increase your risk of getting an infection. Call your care team for advice if you get a fever, chills, sore throat, or other symptoms of a cold or flu. Do not treat yourself. Try to avoid being around people who are sick. Tell your dentist and dental surgeon that you are taking this medication. You should not have major dental surgery while on this medication. See your dentist to have a dental exam and fix any dental problems before starting this medication. Take good care of your teeth while on this medication. Make sure you see your dentist for regular follow-up appointments. You should make sure you get enough calcium and vitamin D while you are taking this medication. Discuss the foods you eat and the vitamins you take with your care team. Talk to your care team if you are pregnant or think you might be  pregnant. This medication can cause serious birth defects if taken during pregnancy and for 5 months after the last dose. You will need a negative pregnancy test before starting this medication. Contraception is recommended while taking this medication and for 5 months after the last dose. Your care team can help you find the option that works for you. Talk to your care team before breastfeeding. Changes to your treatment plan may be needed. What side effects may I notice from receiving this medication? Side effects that you should report to your care team as soon as possible: Allergic reactions--skin rash, itching, hives, swelling of the face, lips, tongue, or throat Infection--fever, chills, cough, sore throat, wounds that don't heal, pain or trouble when passing urine, general feeling of discomfort or being unwell Low calcium level--muscle pain or cramps, confusion, tingling, or numbness in the hands or feet Osteonecrosis of the jaw--pain, swelling, or redness in the mouth, numbness of the jaw, poor healing after dental work, unusual discharge from the mouth, visible bones in the mouth Severe bone, joint, or muscle pain Skin infection--skin redness, swelling, warmth, or pain Side effects that usually do not require medical attention (report these  to your care team if they continue or are bothersome): Back pain Headache Joint pain Muscle pain Pain in the hands, arms, legs, or feet Runny or stuffy nose Sore throat This list may not describe all possible side effects. Call your doctor for medical advice about side effects. You may report side effects to FDA at 1-800-FDA-1088. Where should I keep my medication? This medication is given in a hospital or clinic. It will not be stored at home. NOTE: This sheet is a summary. It may not cover all possible information. If you have questions about this medicine, talk to your doctor, pharmacist, or health care provider.  2023 Elsevier/Gold Standard  (2022-01-18 00:00:00)

## 2022-07-06 NOTE — Telephone Encounter (Signed)
Request Status Pending Request Number [id number]

## 2022-07-06 NOTE — Telephone Encounter (Signed)
OOP estimated cost is $315. Bethena Roys patient's daughter advised. They would like to proceed. Will complete PA with insurance to make sure it will be approved and then will touch base with Bethena Roys on scheduling part.

## 2022-07-09 NOTE — Telephone Encounter (Signed)
Authorization Status Approved Authorization Number [id number]  Authorization Start Date 07-06-2022 Authorization End Date 07-07-2023

## 2022-07-09 NOTE — Telephone Encounter (Signed)
Spoke with April Short, [place] approved, lab was done on 07/02/22 CrCl 37.49 mL/min, Calcium normal at 9.5. Nurse Visit scheduled for 07/13/22. FYI to PCP

## 2022-07-09 NOTE — Telephone Encounter (Signed)
Noted! Thank you

## 2022-07-13 ENCOUNTER — Ambulatory Visit (INDEPENDENT_AMBULATORY_CARE_PROVIDER_SITE_OTHER): Payer: Medicare Other

## 2022-07-13 DIAGNOSIS — M810 Age-related osteoporosis without current pathological fracture: Secondary | ICD-10-CM

## 2022-07-13 MED ORDER — DENOSUMAB 60 MG/ML ~~LOC~~ SOSY
60.0000 mg | PREFILLED_SYRINGE | Freq: Once | SUBCUTANEOUS | Status: AC
  Administered 2022-07-13: 60 mg via SUBCUTANEOUS

## 2022-07-13 NOTE — Progress Notes (Signed)
Per orders of Dr. Gutierrez, injection of Prolia, given by Brittanyann Wittner G Janne Faulk. Patient tolerated injection well.  

## 2022-07-22 ENCOUNTER — Ambulatory Visit: Payer: Medicare Other | Admitting: Podiatry

## 2022-07-28 ENCOUNTER — Encounter: Payer: Self-pay | Admitting: Cardiovascular Disease

## 2022-07-28 ENCOUNTER — Ambulatory Visit: Payer: Medicare Other | Attending: Cardiovascular Disease | Admitting: Cardiovascular Disease

## 2022-07-28 VITALS — BP 107/61 | HR 62 | Ht 61.0 in | Wt 123.0 lb

## 2022-07-28 DIAGNOSIS — E785 Hyperlipidemia, unspecified: Secondary | ICD-10-CM | POA: Diagnosis not present

## 2022-07-28 DIAGNOSIS — E1169 Type 2 diabetes mellitus with other specified complication: Secondary | ICD-10-CM | POA: Diagnosis not present

## 2022-07-28 DIAGNOSIS — I2510 Atherosclerotic heart disease of native coronary artery without angina pectoris: Secondary | ICD-10-CM

## 2022-07-28 DIAGNOSIS — Z95810 Presence of automatic (implantable) cardiac defibrillator: Secondary | ICD-10-CM

## 2022-07-28 DIAGNOSIS — Z9861 Coronary angioplasty status: Secondary | ICD-10-CM

## 2022-07-28 DIAGNOSIS — I255 Ischemic cardiomyopathy: Secondary | ICD-10-CM

## 2022-07-28 NOTE — Assessment & Plan Note (Signed)
History of CAD status post stenting of circumflex and obtuse marginal branch in [place] 10/31/2015 with a 2.5 x 30 mm long resolute stent in the obtuse marginal branch and a 3.5 x 12 mm long stent placed in the circumflex.  She denies chest pain.

## 2022-07-28 NOTE — Assessment & Plan Note (Signed)
History of hyperlipidemia on statin therapy with lipid profile performed 06/08/2021 revealing total cholesterol 120, LDL 46 and HDL 38.

## 2022-07-28 NOTE — Assessment & Plan Note (Signed)
History of BiV ICD placed in [place] in 2018 followed by Dr. Lovena Le.  She has had no ICD discharges.

## 2022-07-28 NOTE — Assessment & Plan Note (Signed)
History of ischemic cardiomyopathy with an EF by last echo 02/12/2016 of 20 to 25%.  She had no significant valvular disease.  She has no heart failure symptoms.  She is on guideline directed optimal medical therapy including carvedilol, Entresto and [place].

## 2022-07-28 NOTE — Progress Notes (Signed)
07/28/2022 CHANI GHANEM   01-Dec-1934  [id number]  Primary Physician Ria Bush, MD Primary Cardiologist: Lorretta Harp MD Lupe Carney, [place]  HPI:  April Short is a 87 y.o.  widowed Latino female accompanied by her daughters Regino Schultze and Constance Holster today. I last saw her in the office 06/27/2020. She was referred by Karis Juba for cardiac evaluation to be established in our practice. She recently relocated from [place] to [place] to be closer to family. She does have a history of diabetes. She had 2 drug-eluting stents placed in her circumflex and obtuse marginal branch and [place] 10/31/15 with a 2.5 x 30 mm long resolute stent placement in obtuse marginal branch and a 3.5 mm x 12 mm long placed in the circumflex. She is on appropriate medicines for systolic heart failure. She recently lost a significant amount of weight secondary to diuresis. She isambulatory but really does not leave the house. She denies chest pain. She does have some dyspnea on exertion. She is on dual therapy with Brilenta. ince I saw her in the office proximally 6 months ago she's remained currently stable. She denies chest pain or shortness of breath. She is not ambulatory with a cane. She was transitioned to interest. There was a discussion regarding ICD implantation for primary prevention however at 87 years old and not sure that this is necessarily a wise thing to do.    Since I saw her in the office 2 years ago she continues to do well.  She denies chest pain or shortness of breath.  She has had no ICD discharges.   Current Meds  Medication Sig   albuterol (PROVENTIL) (2.5 MG/3ML) 0.083% nebulizer solution Take 3 mLs (2.5 mg total) by nebulization 3 (three) times daily as needed for wheezing or shortness of breath.   Alpha-Lipoic Acid 600 MG CAPS Take 1 capsule (600 mg total) by mouth daily.   aspirin EC 81 MG tablet Take 81 mg by mouth daily. Swallow whole.   atorvastatin (LIPITOR) 40  MG tablet TAKE 1 TABLET BY MOUTH EVERY DAY   carvedilol (COREG) 25 MG tablet TAKE 1 TABLET (25 MG TOTAL) BY MOUTH 2 (TWO) TIMES DAILY WITH A MEAL.   Cholecalciferol (VITAMIN D3) 25 MCG (1000 UT) CAPS Take 1 capsule (1,000 Units total) by mouth daily.   dapagliflozin propanediol (FARXIGA) 5 MG TABS tablet Take 1 tablet (5 mg total) by mouth daily.   ENTRESTO 49-51 MG TAKE 1 TABLET BY MOUTH TWICE A DAY   sertraline (ZOLOFT) 25 MG tablet TAKE 1 TABLET (25 MG TOTAL) BY MOUTH DAILY.   ticagrelor (BRILINTA) 60 MG TABS tablet TAKE 1 TABLET BY MOUTH 2 TIMES DAILY.     No Known Allergies  Social History   Socioeconomic History   Marital status: Widowed    Spouse name: Not on file   Number of children: 7   Years of education: Not on file   Highest education level: Not on file  Occupational History   Not on file  Tobacco Use   Smoking status: Never   Smokeless tobacco: Never  Substance and Sexual Activity   Alcohol use: Yes    Alcohol/week: 0.0 standard drinks of alcohol    Comment: occasionally   Drug use: No   Sexual activity: Not on file  Other Topics Concern   Not on file  Social History Narrative   Widow 2018, lives with daughter   From [place], lived in [place] then  [place].    Moved to [facility] 2021 to be closer to family    Occ: retired   Activity:    Diet:    Social Determinants of Radio broadcast assistant Strain: Low Risk  (06/16/2022)   Overall Financial Resource Strain (CARDIA)    Difficulty of Paying Living Expenses: Not hard at all  Food Insecurity: No Food Insecurity (06/16/2022)   Hunger Vital Sign    Worried About Running Out of Food in the Last Year: Never true    [place] in the Last Year: Never true  Transportation Needs: No Transportation Needs (06/16/2022)   PRAPARE - Hydrologist (Medical): No    Lack of Transportation (Non-Medical): No  Physical Activity: Insufficiently Active (06/16/2022)   Exercise Vital Sign    Days of Exercise  per Week: 3 days    Minutes of Exercise per Session: 30 min  Stress: No Stress Concern Present (06/16/2022)   [place]    Feeling of Stress : Not at all  Social Connections: Socially Isolated (06/16/2022)   Social Connection and Isolation Panel [NHANES]    Frequency of Communication with Friends and Family: More than three times a week    Frequency of Social Gatherings with Friends and Family: Three times a week    Attends Religious Services: Never    Active Member of Clubs or Organizations: No    Attends Archivist Meetings: Never    Marital Status: Widowed  Intimate Partner Violence: Not At Risk (06/16/2022)   Humiliation, Afraid, Rape, and Kick questionnaire    Fear of Current or Ex-Partner: No    Emotionally Abused: No    Physically Abused: No    Sexually Abused: No     Review of Systems: General: negative for chills, fever, night sweats or weight changes.  Cardiovascular: negative for chest pain, dyspnea on exertion, edema, orthopnea, palpitations, paroxysmal nocturnal dyspnea or shortness of breath Dermatological: negative for rash Respiratory: negative for cough or wheezing Urologic: negative for hematuria Abdominal: negative for nausea, vomiting, diarrhea, bright red blood per rectum, melena, or hematemesis Neurologic: negative for visual changes, syncope, or dizziness All other systems reviewed and are otherwise negative except as noted above.    Blood pressure 107/61, pulse 62, height '5\' 1"'$  (1.549 m), weight 123 lb (55.8 kg), SpO2 97 %.  General appearance: alert and no distress Neck: no adenopathy, no carotid bruit, no JVD, supple, symmetrical, trachea midline, and thyroid not enlarged, symmetric, no tenderness/mass/nodules Lungs: clear to auscultation bilaterally Heart: regular rate and rhythm, S1, S2 normal, no murmur, click, rub or gallop Extremities: extremities normal, atraumatic, no  cyanosis or edema Pulses: 2+ and symmetric Skin: Skin color, texture, turgor normal. No rashes or lesions Neurologic: Grossly normal  EKG AV sequentially paced rhythm at a rate of 62.  I personally reviewed this EKG.  ASSESSMENT AND PLAN:   Ischemic cardiomyopathy History of ischemic cardiomyopathy with an EF by last echo 02/12/2016 of 20 to 25%.  She had no significant valvular disease.  She has no heart failure symptoms.  She is on guideline directed optimal medical therapy including carvedilol, Entresto and [place].  Hyperlipidemia associated with type 2 diabetes mellitus ([place]) History of hyperlipidemia on statin therapy with lipid profile performed 06/08/2021 revealing total cholesterol 120, LDL 46 and HDL 38.  CAD S/P percutaneous coronary angioplasty History of CAD status post stenting of circumflex and obtuse marginal branch in  [place] 10/31/2015 with a 2.5 x 30 mm long resolute stent in the obtuse marginal branch and a 3.5 x 12 mm long stent placed in the circumflex.  She denies chest pain.  ICD (implantable cardioverter-defibrillator) in place History of BiV ICD placed in [place] in 2018 followed by Dr. Lovena Le.  She has had no ICD discharges.     Lorretta Harp MD FACP,FACC,FAHA, [facility] 07/28/2022 12:25 PM

## 2022-07-28 NOTE — Patient Instructions (Addendum)
Medication Instructions:  NO CHANGES  *If you need a refill on your cardiac medications before your next appointment, please call your pharmacy*  Testing:  Your physician has requested that you have an echocardiogram. Echocardiography is a painless test that uses sound waves to create images of your heart. It provides your doctor with information about the size and shape of your heart and how well your heart's chambers and valves are working. This procedure takes approximately one hour. There are no restrictions for this procedure. Please do NOT wear cologne, perfume, aftershave, or lotions (deodorant is allowed). Please arrive 15 minutes prior to your appointment time. Test location: [address] [employer]    Follow-Up: At [facility], you and your health needs are our priority.  As part of our continuing mission to provide you with exceptional heart care, we have created designated Provider Care Teams.  These Care Teams include your primary Cardiologist (physician) and Advanced Practice Providers (APPs -  Physician Assistants and Nurse Practitioners) who all work together to provide you with the care you need, when you need it.  We recommend signing up for the patient portal called "MyChart".  Sign up information is provided on this After Visit Summary.  MyChart is used to connect with patients for Virtual Visits (Telemedicine).  Patients are able to view lab/test results, encounter notes, upcoming appointments, etc.  Non-urgent messages can be sent to your provider as well.   To learn more about what you can do with MyChart, go to [email or web address].    Your next appointment:   12 month(s)  The format for your next appointment:   In Person  Provider:   Quay Burow, MD     Other Instructions  Please schedule appointment with Dr. Lovena Le - last visit was 06/2020

## 2022-07-29 ENCOUNTER — Telehealth: Payer: Self-pay | Admitting: *Deleted

## 2022-07-29 NOTE — Telephone Encounter (Signed)
"  She has an appointment at 9:15 am today.  Suddenly, she can not make it, she has the flu. Call me if you have any questions."  I removed the no show status from her chart.

## 2022-08-01 ENCOUNTER — Other Ambulatory Visit: Payer: Self-pay | Admitting: Cardiovascular Disease

## 2022-08-02 ENCOUNTER — Other Ambulatory Visit: Payer: Self-pay | Admitting: Cardiovascular Disease

## 2022-08-02 ENCOUNTER — Telehealth: Payer: Self-pay | Admitting: Cardiovascular Disease

## 2022-08-02 NOTE — Telephone Encounter (Signed)
*  STAT* If patient is at the pharmacy, call can be transferred to refill team.   1. Which medications need to be refilled? (please list name of each medication and dose if known)   ticagrelor (BRILINTA) 60 MG TABS tablet    2. Which pharmacy/location (including street and city if local pharmacy) is medication to be sent to?  [id number] [place] - [address]   3. Do they need a 30 day or 90 day supply? 90 days

## 2022-08-03 NOTE — Telephone Encounter (Signed)
Refill sent 08/02/2022.

## 2022-08-04 LAB — HM DIABETES EYE EXAM

## 2022-08-08 ENCOUNTER — Other Ambulatory Visit: Payer: Self-pay | Admitting: Family Medicine

## 2022-08-08 DIAGNOSIS — D631 Anemia in chronic kidney disease: Secondary | ICD-10-CM

## 2022-08-08 DIAGNOSIS — E1169 Type 2 diabetes mellitus with other specified complication: Secondary | ICD-10-CM

## 2022-08-08 DIAGNOSIS — N183 Chronic kidney disease, stage 3 unspecified: Secondary | ICD-10-CM

## 2022-08-08 DIAGNOSIS — M810 Age-related osteoporosis without current pathological fracture: Secondary | ICD-10-CM

## 2022-08-08 DIAGNOSIS — D509 Iron deficiency anemia, unspecified: Secondary | ICD-10-CM

## 2022-08-08 DIAGNOSIS — E1142 Type 2 diabetes mellitus with diabetic polyneuropathy: Secondary | ICD-10-CM

## 2022-08-09 ENCOUNTER — Other Ambulatory Visit (INDEPENDENT_AMBULATORY_CARE_PROVIDER_SITE_OTHER): Payer: Medicare Other

## 2022-08-09 DIAGNOSIS — D509 Iron deficiency anemia, unspecified: Secondary | ICD-10-CM | POA: Diagnosis not present

## 2022-08-09 DIAGNOSIS — E785 Hyperlipidemia, unspecified: Secondary | ICD-10-CM | POA: Diagnosis not present

## 2022-08-09 DIAGNOSIS — E1142 Type 2 diabetes mellitus with diabetic polyneuropathy: Secondary | ICD-10-CM

## 2022-08-09 DIAGNOSIS — E1169 Type 2 diabetes mellitus with other specified complication: Secondary | ICD-10-CM | POA: Diagnosis not present

## 2022-08-09 LAB — COMPREHENSIVE METABOLIC PANEL
ALT: 13 U/L (ref 0–35)
AST: 16 U/L (ref 0–37)
Albumin: 4 g/dL (ref 3.5–5.2)
Alkaline Phosphatase: 81 U/L (ref 39–117)
BUN: 25 mg/dL — ABNORMAL HIGH (ref 6–23)
CO2: 27 mEq/L (ref 19–32)
Calcium: 9 mg/dL (ref 8.4–10.5)
Chloride: 102 mEq/L (ref 96–112)
Creatinine, Ser: 0.97 mg/dL (ref 0.40–1.20)
GFR: 53.18 mL/min — ABNORMAL LOW (ref >60.00)
Glucose, Bld: 204 mg/dL — ABNORMAL HIGH (ref 70–99)
Potassium: 4.9 mEq/L (ref 3.5–5.1)
Sodium: 137 mEq/L (ref 135–145)
Total Bilirubin: 0.5 mg/dL (ref 0.2–1.2)
Total Protein: 7.8 g/dL (ref 6.0–8.3)

## 2022-08-09 LAB — IBC PANEL
Iron: 64 ug/dL (ref 42–145)
Saturation Ratios: 19.2 % — ABNORMAL LOW (ref 20.0–50.0)
TIBC: 333.2 ug/dL (ref 250.0–450.0)
Transferrin: 238 mg/dL (ref 212.0–360.0)

## 2022-08-09 LAB — LIPID PANEL
Cholesterol: 160 mg/dL (ref 0–200)
HDL: 58.4 mg/dL (ref >39.00)
LDL Cholesterol: 71 mg/dL (ref 0–99)
NonHDL: 101.85
Total CHOL/HDL Ratio: 3
Triglycerides: 153 mg/dL — ABNORMAL HIGH (ref 0.0–149.0)
VLDL: 30.6 mg/dL (ref 0.0–40.0)

## 2022-08-09 LAB — HEMOGLOBIN A1C: Hgb A1c MFr Bld: 8.2 % — ABNORMAL HIGH (ref 4.6–6.5)

## 2022-08-09 LAB — MICROALBUMIN / CREATININE URINE RATIO
Creatinine,U: 61.5 mg/dL
Microalb Creat Ratio: 5.9 mg/g (ref 0.0–30.0)
Microalb, Ur: 3.6 mg/dL — ABNORMAL HIGH (ref 0.0–1.9)

## 2022-08-10 ENCOUNTER — Encounter: Payer: Self-pay | Admitting: Primary Care

## 2022-08-11 ENCOUNTER — Telehealth: Payer: Self-pay | Admitting: Family Medicine

## 2022-08-11 DIAGNOSIS — H35373 Puckering of macula, bilateral: Secondary | ICD-10-CM

## 2022-08-11 NOTE — Telephone Encounter (Signed)
Left a message on voicemail for patient's daughter  to call the office back.

## 2022-08-11 NOTE — Telephone Encounter (Signed)
Please notify diabetes retinopathy screen returned normal.  However there was evidence of possible epi-retinal membrane which is a thin sheet of fibrous tissue that can develop on a part of the eye associated with vision loss - recommend referral to retina specialist. I've placed a referral for this.

## 2022-08-16 NOTE — Telephone Encounter (Signed)
Bethena Roys, pt's daughter, called returning Regina's missed call from 08/11/22. Bethena Roys requested a call back @ [phone]

## 2022-08-16 NOTE — Telephone Encounter (Signed)
April Short advised, referral already sent to [facility] in [place], office information and phone number provided to follow up with them directly to schedule

## 2022-08-18 ENCOUNTER — Other Ambulatory Visit: Payer: Self-pay | Admitting: Family Medicine

## 2022-08-18 DIAGNOSIS — E1169 Type 2 diabetes mellitus with other specified complication: Secondary | ICD-10-CM

## 2022-08-24 ENCOUNTER — Ambulatory Visit (INDEPENDENT_AMBULATORY_CARE_PROVIDER_SITE_OTHER): Payer: Medicare Other | Admitting: Family Medicine

## 2022-08-24 ENCOUNTER — Encounter: Payer: Self-pay | Admitting: Family Medicine

## 2022-08-24 VITALS — BP 112/64 | HR 61 | Temp 97.2°F | Ht 61.0 in | Wt 123.2 lb

## 2022-08-24 DIAGNOSIS — G6289 Other specified polyneuropathies: Secondary | ICD-10-CM

## 2022-08-24 DIAGNOSIS — R32 Unspecified urinary incontinence: Secondary | ICD-10-CM

## 2022-08-24 DIAGNOSIS — Z9861 Coronary angioplasty status: Secondary | ICD-10-CM

## 2022-08-24 DIAGNOSIS — Z7189 Other specified counseling: Secondary | ICD-10-CM

## 2022-08-24 DIAGNOSIS — N183 Chronic kidney disease, stage 3 unspecified: Secondary | ICD-10-CM

## 2022-08-24 DIAGNOSIS — I110 Hypertensive heart disease with heart failure: Secondary | ICD-10-CM

## 2022-08-24 DIAGNOSIS — H35373 Puckering of macula, bilateral: Secondary | ICD-10-CM | POA: Diagnosis not present

## 2022-08-24 DIAGNOSIS — Z95810 Presence of automatic (implantable) cardiac defibrillator: Secondary | ICD-10-CM

## 2022-08-24 DIAGNOSIS — Z0000 Encounter for general adult medical examination without abnormal findings: Secondary | ICD-10-CM

## 2022-08-24 DIAGNOSIS — D509 Iron deficiency anemia, unspecified: Secondary | ICD-10-CM

## 2022-08-24 DIAGNOSIS — N1831 Chronic kidney disease, stage 3a: Secondary | ICD-10-CM | POA: Diagnosis not present

## 2022-08-24 DIAGNOSIS — E1122 Type 2 diabetes mellitus with diabetic chronic kidney disease: Secondary | ICD-10-CM

## 2022-08-24 DIAGNOSIS — E1142 Type 2 diabetes mellitus with diabetic polyneuropathy: Secondary | ICD-10-CM

## 2022-08-24 DIAGNOSIS — I5022 Chronic systolic (congestive) heart failure: Secondary | ICD-10-CM

## 2022-08-24 DIAGNOSIS — M810 Age-related osteoporosis without current pathological fracture: Secondary | ICD-10-CM

## 2022-08-24 DIAGNOSIS — F4321 Adjustment disorder with depressed mood: Secondary | ICD-10-CM | POA: Diagnosis not present

## 2022-08-24 DIAGNOSIS — E785 Hyperlipidemia, unspecified: Secondary | ICD-10-CM

## 2022-08-24 DIAGNOSIS — R413 Other amnesia: Secondary | ICD-10-CM

## 2022-08-24 DIAGNOSIS — D631 Anemia in chronic kidney disease: Secondary | ICD-10-CM

## 2022-08-24 DIAGNOSIS — I2510 Atherosclerotic heart disease of native coronary artery without angina pectoris: Secondary | ICD-10-CM

## 2022-08-24 DIAGNOSIS — E1169 Type 2 diabetes mellitus with other specified complication: Secondary | ICD-10-CM

## 2022-08-24 NOTE — Patient Instructions (Addendum)
Considere vacuna contra herpes zoster (la culebrilla).  Traiganme copia de directivos avanzados.  Haga cita con dentista local.  Gusto verlos hoy.  Regresar en 6 meses para proxima visita control de diabetes.   Mantenimiento de la salud despus de los 53 aos de edad Health Maintenance After Age 85 Despus de los 65 aos de edad, corre un riesgo mayor de Tourist information centre manager enfermedades e infecciones a Barrister's clerk, como tambin de sufrir lesiones por cadas. Las cadas son la causa principal de las fracturas de huesos y lesiones en la cabeza de personas mayores de 85 aos de edad. Recibir cuidados preventivos de forma regular puede ayudarlo a mantenerse saludable y en buen [place]. Los cuidados preventivos incluyen realizarse anlisis de forma regular y Actor en el estilo de vida segn las recomendaciones del mdico. Converse con el mdico sobre lo siguiente: Las pruebas de deteccin y los anlisis que debe Dispensing optician. Una prueba de deteccin es un estudio que se para Hydrographic surveyor la presencia de una enfermedad cuando no tiene sntomas. Un plan de dieta y ejercicios adecuado para usted. Qu debo saber sobre las pruebas de deteccin y los anlisis para prevenir cadas? Realizarse pruebas de deteccin y [employer] es la mejor manera de Hydrographic surveyor un problema de salud de forma temprana. El diagnstico y tratamiento tempranos le brindan la mejor oportunidad de Chief Technology Officer las afecciones mdicas que son comunes despus de los 85 aos de edad. Ciertas afecciones y elecciones de estilo de vida pueden hacer que sea ms propenso a sufrir Engineer, manufacturing. El mdico puede recomendarle lo siguiente: Controles regulares de la visin. Una visin deficiente y afecciones como las cataratas pueden hacer que sea ms propenso a sufrir Engineer, manufacturing. Si [place] lentes, asegrese de obtener una receta actualizada si su visin cambia. Revisin de medicamentos. Revise regularmente con el mdico todos los medicamentos que toma, incluidos los  medicamentos de [place]. Consulte al [employer] efectos secundarios que pueden hacer que sea ms propenso a sufrir Engineer, manufacturing. Informe al mdico si alguno de los medicamentos que toma lo hace sentir mareado o somnoliento. Controles de fuerza y equilibrio. El mdico puede recomendar ciertos estudios para controlar su fuerza y equilibrio al estar de pie, al caminar o al cambiar de posicin. Examen de los pies. El dolor y Chiropractor en los pies, como tambin no utilizar el calzado [place], pueden hacer que sea ms propenso a sufrir Engineer, manufacturing. Pruebas de deteccin, que incluyen las siguientes: Pruebas de deteccin para la osteoporosis. La osteoporosis es una afeccin que hace que los huesos se tornen ms dbiles y se quiebren con ms facilidad. Pruebas de deteccin para la presin arterial. Los cambios en la presin arterial y los medicamentos para Chief Technology Officer la presin arterial pueden hacerlo sentir mareado. Prueba de deteccin de la depresin. Es ms probable que sufra una cada si tiene miedo a caerse, se siente deprimido o se siente incapaz de Patent examiner. Prueba de deteccin de consumo de alcohol. Beber demasiado alcohol puede afectar su equilibrio y puede hacer que sea ms propenso a sufrir Engineer, manufacturing. Siga estas indicaciones en su casa: Estilo de vida No beba alcohol si: Su mdico le indica no hacerlo. Si bebe alcohol: Limite la cantidad que bebe a lo siguiente: De 0 a 1 medida por da para las mujeres. De 0 a 2 medidas por da para los hombres. Sepa cunta cantidad de alcohol hay en las bebidas que toma. En los [place], una medida [place] a una botella  de cerveza de 12 oz (355 ml), un vaso de vino de 5 oz (148 ml) o un vaso de una bebida alcohlica de alta graduacin de 1 oz (44 ml). No consuma ningn producto que contenga nicotina o tabaco. Estos productos incluyen cigarrillos, tabaco para Higher education careers adviser y aparatos de vapeo, como los Science writer. Si necesita ayuda para dejar de consumir estos productos, consulte al [employer]. Actividad  Siga un programa de ejercicio regular para mantenerse en forma. Esto lo ayudar a Contractor equilibrio. Consulte al mdico qu tipos de ejercicios son adecuados para usted. Si necesita un bastn o un andador, selo segn las recomendaciones del mdico. Utilice calzado con buen apoyo y suela antideslizante. Seguridad  [place]ire los [place] puedan causar tropiezos tales como alfombras, cables u obstculos. Instale equipos de seguridad, como barras para sostn en los baos y barandas de seguridad en las escaleras. [place] habitaciones y los pasillos bien iluminados. Indicaciones generales Hable con el mdico sobre sus riesgos de sufrir una cada. Infrmele a su mdico si: Se cae. Asegrese de informarle a su mdico acerca de todas las cadas, incluso aquellas que parecen ser [employer]. Se siente mareado, cansado (tiene fatiga) o siente que pierde el equilibrio. Use los medicamentos de venta libre y los recetados solamente como se lo haya indicado el mdico. Estos incluyen suplementos. Siga una dieta sana y [place] [employer] un peso saludable. Una dieta saludable incluye productos lcteos descremados, carnes bajas en contenido de grasa ([place]), fibra de granos enteros, frijoles y [place] frutas y verduras. [place]. Realcese los estudios de rutina de la salud, dentales y de Public librarian. Resumen Tener un estilo de vida saludable y recibir cuidados preventivos pueden ayudar a Theatre stage manager salud y el bienestar despus de los 53 aos de [place]. Realizarse pruebas de deteccin y [employer] es la mejor manera de Hydrographic surveyor un problema de salud de forma temprana y Lourena Simmonds a Product/process development scientist una cada. El diagnstico y tratamiento tempranos le brindan la mejor oportunidad de Chief Technology Officer las afecciones mdicas ms comunes en las personas mayores de 85 aos de edad. Las cadas son la causa principal de  las fracturas de huesos y lesiones en la cabeza de personas mayores de 44 aos de edad. Tome precauciones para evitar una cada en su casa. Trabaje con el mdico para saber qu cambios que puede hacer para mejorar su salud y [place], y [place]. Esta informacin no tiene Marine scientist el consejo del mdico. Asegrese de hacerle al mdico cualquier pregunta que tenga. Document Revised: 02/11/2021 Document Reviewed: 02/11/2021 Elsevier Patient Education  [place].

## 2022-08-24 NOTE — Assessment & Plan Note (Signed)
Preventative protocols reviewed and updated unless pt declined. Discussed healthy diet and lifestyle.  

## 2022-08-24 NOTE — Assessment & Plan Note (Signed)
Advanced directive discussion - would want Bethena Roys to be HCPOA. Does not want resuscitation, intubation, dialysis. Has packet at home - asked to bring [place] copy.

## 2022-08-24 NOTE — Progress Notes (Unsigned)
Patient ID: [name], female    DOB: August 07, 1935, 87 y.o.   MRN: [id number]  This visit was conducted in person.  BP 112/64   Pulse 61   Temp (!) 97.2 F (36.2 C) (Temporal)   Ht '5\' 1"'$  (1.549 m)   Wt 123 lb 3.2 oz (55.9 kg)   SpO2 97%   BMI 23.28 kg/m    CC: CPE Subjective:   HPI: April Short is a 87 y.o. female presenting on 08/24/2022 for Annual Exam (MCR prt 2. Pt accompanied by daughters, Regino Schultze and Constance Holster. )   Saw health advisor 05/2022 for medicare wellness visit. Note reviewed.     06/16/2022    9:27 AM  6CIT Screen  What Year? 0 points  What month? 3 points  What time? 0 points  Count back from 20 0 points  Months in reverse 4 points  Repeat phrase 10 points  Total Score 17 points  Failed cognitive screen. Pt has not trouble with remote memory. Daughters note some short term memory loss. ?component of language barrier with high 6CIT score.   No results found.  Flowsheet Row Clinical Support from 06/16/2022 in [place] [facility] [place]  PHQ-2 Total Score 0          06/16/2022    9:24 AM 06/15/2021    4:04 PM 01/14/2016    3:12 PM  Fall Risk   Falls in the past year? 0 0 No  Number falls in past yr: 0    Injury with Fall? 0    Risk for fall due to : No Fall Risks    Follow up Falls prevention discussed;Falls evaluation completed     Upcoming trip to [place] for Christmas to visit her sisters.  Established with cardiology Dr Gwenlyn Found, last seen 07/2022. Continues carvedilol, Delene Loll, Farxiga. Also sees EP Dr Lovena Le for [facility] placed in [place] 05/2017.   Known diabetic since age 85 yo, currently only on [place] '5mg'$  daily. Dose dropped from '10mg'$  04/2022 due to urinary symptoms (frequency, incontinence) which has since improved.  Diabetic retinopathy screen normal 07/2022 - however showed epiretinal membrane - referred to retinologist, appt pending. She denies any vision changes.  H/o cervical cancer, colon cancer.   Neuropathy since  2014. Started in hands, now progressing into feet. Daughters note significant benefit in mood since starting sertraline '25mg'$ .    Preventative: H/o colon cancer. Colonoscopy 11/2011 1 polyp, int hem (done in [place]).  Virtual colonoscopy 04/2016 - [facility]ED Henrene Pastor).  [facility]  Well woman exam - H/o cervical cancer 2005 s/p total hysterectomy w/BSO.  Recent DEXA 06/2022 - T -4.4 R forearm, LFT -3.2.  Prolia started, first dose 07/13/2022.  Lung cancer screening - not eligible Flu shot - yearly COVID shot - Moderna 09/2019, 10/2019, booster 08/2020  Tetanus shot - ~2010 Pneumonia shot - did have this done ~2016 Shingrix - discussed  Advanced directive discussion - would want Bethena Roys to be HCPOA. Does not want resuscitation, intubation, dialysis. Has packet at home - asked to bring [place] copy.  Seat belt use discussed  Sunscreen use discussed. No changing moles on skin.  Non smoker  Alcohol - seldom. H/o alcohol use for 20 yrs  Dentist - years ago, doesn't use partial dentures  Eye exam - pending appt with retinologist for epiretinal membrane.  Bowel - no constipation Bladder - no incontinence  Daughter Bethena Roys who lives in [place] is [employer] 2018,  lives with daughter locally From [place], lived in [place] then [place].  Moved to [facility] 2021 to be closer to family  Occ: retired Activity:  Diet:      Relevant past medical, surgical, family and social history reviewed and updated as indicated. Interim medical history since our last visit reviewed. Allergies and medications reviewed and updated. Outpatient Medications Prior to Visit  Medication Sig Dispense Refill   albuterol (PROVENTIL) (2.5 MG/3ML) 0.083% nebulizer solution Take 3 mLs (2.5 mg total) by nebulization 3 (three) times daily as needed for wheezing or shortness of breath. 75 mL 1   Alpha-Lipoic Acid 600 MG CAPS Take 1 capsule (600 mg total) by mouth daily.     aspirin EC 81 MG tablet Take 81 mg by mouth daily. Swallow whole.      atorvastatin (LIPITOR) 40 MG tablet TAKE 1 TABLET BY MOUTH EVERY DAY 90 tablet 0   BRILINTA 60 MG TABS tablet TAKE 1 TABLET BY MOUTH TWICE A DAY 60 tablet 8   carvedilol (COREG) 25 MG tablet TAKE 1 TABLET (25 MG TOTAL) BY MOUTH 2 (TWO) TIMES DAILY WITH A MEAL. 180 tablet 0   Cholecalciferol (VITAMIN D3) 25 MCG (1000 UT) CAPS Take 1 capsule (1,000 Units total) by mouth daily. 30 capsule    dapagliflozin propanediol (FARXIGA) 5 MG TABS tablet Take 1 tablet (5 mg total) by mouth daily. 30 tablet 6   ENTRESTO 49-51 MG TAKE 1 TABLET BY MOUTH TWICE A DAY 60 tablet 12   sertraline (ZOLOFT) 25 MG tablet TAKE 1 TABLET (25 MG TOTAL) BY MOUTH DAILY. 90 tablet 0   No facility-administered medications prior to visit.     Per HPI unless specifically indicated in ROS section below Review of Systems  Objective:  BP 112/64   Pulse 61   Temp (!) 97.2 F (36.2 C) (Temporal)   Ht '5\' 1"'$  (1.549 m)   Wt 123 lb 3.2 oz (55.9 kg)   SpO2 97%   BMI 23.28 kg/m   Wt Readings from Last 3 Encounters:  08/24/22 123 lb 3.2 oz (55.9 kg)  07/28/22 123 lb (55.8 kg)  07/02/22 123 lb 6 oz (56 kg)      Physical Exam Vitals and nursing note reviewed.  Constitutional:      Appearance: Normal appearance. She is not ill-appearing.  HENT:     Head: Normocephalic and atraumatic.     Right Ear: Tympanic membrane, ear canal and external ear normal. There is no impacted cerumen.     Left Ear: Tympanic membrane, ear canal and external ear normal. There is no impacted cerumen.     Nose: Nose normal.     Mouth/Throat:     Mouth: Mucous membranes are moist.     Pharynx: Oropharynx is clear. No oropharyngeal exudate or posterior oropharyngeal erythema.  Eyes:     General:        Right eye: No discharge.        Left eye: No discharge.     Extraocular Movements: Extraocular movements intact.     Conjunctiva/sclera: Conjunctivae normal.     Pupils: Pupils are equal, round, and reactive to light.  Neck:     Thyroid: No  thyroid mass or thyromegaly.  Cardiovascular:     Rate and Rhythm: Normal rate and regular rhythm.     Pulses: Normal pulses.     Heart sounds: Normal heart sounds. No murmur heard. Pulmonary:     Effort: Pulmonary effort is normal. No respiratory distress.  Breath sounds: Normal breath sounds. No wheezing, rhonchi or rales.  Abdominal:     General: Bowel sounds are normal. There is no distension.     Palpations: Abdomen is soft. There is no mass.     Tenderness: There is no abdominal tenderness. There is no guarding or rebound.     Hernia: No hernia is present.  Musculoskeletal:     Cervical back: Normal range of motion and neck supple. No rigidity.     Right lower leg: No edema.     Left lower leg: No edema.  Lymphadenopathy:     Cervical: No cervical adenopathy.  Skin:    General: Skin is warm and dry.     Findings: No rash.  Neurological:     General: No focal deficit present.     Mental Status: She is alert. Mental status is at baseline.  Psychiatric:        Mood and Affect: Mood normal.        Behavior: Behavior normal.       Results for orders placed or performed in visit on 08/10/22  HM DIABETES EYE EXAM  Result Value Ref Range   HM Diabetic Eye Exam No Retinopathy No Retinopathy    Assessment & Plan:   Problem List Items Addressed This Visit       Unprioritized   Health maintenance examination - Primary (Chronic)    Preventative protocols reviewed and updated unless pt declined. Discussed healthy diet and lifestyle.       Advanced directives, counseling/discussion (Chronic)    Advanced directive discussion - would want Bethena Roys to be HCPOA. Does not want resuscitation, intubation, dialysis. Has packet at home - asked to bring [place] copy.       Type 2 diabetes mellitus with peripheral neuropathy (HCC)    Chronic, deteriorated with lower farxiga dose.  Encouraged renewed efforts towards diabetic diet.       Peripheral neuropathy    Present since 2014.  Initially in hands, now into feet. She continues alpha lipoic acid for this. Vitamin b12 and b1 levels have been normal. Consider TCA in place of SSRI.  H/o significant alcohol use, no longer. Known diabetic, ?contribution.  Consider further evaluation with SPEP, ANA, etc.       Iron deficiency anemia    Iron levels stable off oral replacement      Hypertensive heart disease    BP stable - continue current regimen.       Hyperlipidemia associated with type 2 diabetes mellitus (HCC)    Chronic, stable on atorvastatin '40mg'$  daily - continue. The ASCVD Risk score (Arnett DK, et al., 2019) failed to calculate for the following reasons:   The 2019 ASCVD risk score is only valid for ages 52 to 80       Chronic systolic CHF (congestive heart failure) (HCC)    H/o ischemic cardiomyopathy, followed by cardiology  Continues entresto, farxiga, carvedilol, and aspirin along with brilinta and atorvastatin.       CAD S/P percutaneous coronary angioplasty    Recent cardiology note reviewed. Stable period.       ICD (implantable cardioverter-defibrillator) in place   Adjustment disorder with depressed mood    Stable period on low dose sertraline '25mg'$  daily - continue.       Memory difficulty    Elevated 6CIT score, family endorse short term memory difficulties but overall stable period.  Will further evaluate with MMSE next visit.       CKD stage  3 due to type 2 diabetes mellitus ([place])    Kidney function actually improved, stable with GFRs 50s.       Anemia in chronic kidney disease (CKD)    Presumed CKD related. Iron levels stable. Will continue to monitor.       Urinary incontinence    Symptoms improved on lower farxiga dose.       Osteoporosis    Reviewed latest DEXA as well as calcium/vit D intake. Prolia started this year, first dose was 07/13/2022.  She's tolerated well.       Epiretinal membrane (ERM) of both eyes    Still pending retinologist appt - daughter Bethena Roys has  contact info to schedule.         No orders of the defined types were placed in this encounter.  No orders of the defined types were placed in this encounter.   Patient instructions: Considere vacuna contra herpes zoster (la culebrilla).  Traiganme copia de directivos avanzados.  Haga cita con dentista local.  Gusto verlos hoy. Regresar en 6 meses para proxima visita control de diabetes.   Follow up plan: Return in about 6 months (around 02/23/2023), or if symptoms worsen or fail to improve, for follow up visit.  Ria Bush, MD

## 2022-08-26 ENCOUNTER — Encounter: Payer: Self-pay | Admitting: Family Medicine

## 2022-08-26 ENCOUNTER — Telehealth: Payer: Self-pay | Admitting: Family Medicine

## 2022-08-26 DIAGNOSIS — E1142 Type 2 diabetes mellitus with diabetic polyneuropathy: Secondary | ICD-10-CM

## 2022-08-26 DIAGNOSIS — E785 Hyperlipidemia, unspecified: Secondary | ICD-10-CM

## 2022-08-26 DIAGNOSIS — M810 Age-related osteoporosis without current pathological fracture: Secondary | ICD-10-CM

## 2022-08-26 NOTE — Assessment & Plan Note (Addendum)
Elevated 6CIT score, family endorse short term memory difficulties but overall stable period.  Will further evaluate with MMSE next visit.

## 2022-08-26 NOTE — Assessment & Plan Note (Signed)
BP stable - continue current regimen.

## 2022-08-26 NOTE — Assessment & Plan Note (Signed)
Stable period on low dose sertraline '25mg'$  daily - continue.

## 2022-08-26 NOTE — Assessment & Plan Note (Signed)
Symptoms improved on lower farxiga dose.

## 2022-08-26 NOTE — Assessment & Plan Note (Signed)
Presumed CKD related. Iron levels stable. Will continue to monitor.

## 2022-08-26 NOTE — Assessment & Plan Note (Addendum)
H/o ischemic cardiomyopathy, followed by cardiology  Continues entresto, farxiga, carvedilol, and aspirin along with brilinta and atorvastatin.

## 2022-08-26 NOTE — Assessment & Plan Note (Addendum)
Present since 2014. Initially in hands, now into feet. She continues alpha lipoic acid for this. Vitamin b12 and b1 levels have been normal. Consider TCA in place of SSRI.  H/o significant alcohol use, no longer. Known diabetic, ?contribution.  Consider further evaluation with SPEP, ANA, etc.

## 2022-08-26 NOTE — Assessment & Plan Note (Signed)
Chronic, stable on atorvastatin 40mg daily - continue. The ASCVD Risk score (Arnett DK, et al., 2019) failed to calculate for the following reasons:   The 2019 ASCVD risk score is only valid for ages 40 to 79  

## 2022-08-26 NOTE — Assessment & Plan Note (Signed)
Reviewed latest DEXA as well as calcium/vit D intake. Prolia started this year, first dose was 07/13/2022.  She's tolerated well.

## 2022-08-26 NOTE — Assessment & Plan Note (Addendum)
Still pending retinologist appt - daughter Bethena Roys has contact info to schedule.

## 2022-08-26 NOTE — Assessment & Plan Note (Signed)
Chronic, deteriorated with lower farxiga dose.  Encouraged renewed efforts towards diabetic diet.

## 2022-08-26 NOTE — Telephone Encounter (Signed)
I believe we discussed nutritionist referral but I can't remember if they were interested in this. Can we call one of the daughters at appt to see if they'd like this referral? Thanks

## 2022-08-26 NOTE — Assessment & Plan Note (Addendum)
Kidney function actually improved, stable with GFRs 50s.

## 2022-08-26 NOTE — Telephone Encounter (Signed)
Spoke with pt's daughter, "Bethena Roys" (on dpr), relaying Dr. Synthia Innocent message.  States pt has seen nutritionist in past.  She agrees to referral but requests someone who speaks Spanish or an interpreter. Plz call Bethena Roys at [phone] to schedule.

## 2022-08-26 NOTE — Assessment & Plan Note (Signed)
Recent cardiology note reviewed. Stable period.

## 2022-08-26 NOTE — Assessment & Plan Note (Signed)
Iron levels stable off oral replacement

## 2022-08-27 NOTE — Addendum Note (Signed)
Addended by: Ria Bush on: 08/27/2022 10:37 AM   Modules accepted: Orders

## 2022-08-27 NOTE — Telephone Encounter (Signed)
New referral to nutritionist in [place] placed.

## 2022-09-06 ENCOUNTER — Ambulatory Visit (HOSPITAL_COMMUNITY): Payer: Medicare Other | Attending: Cardiovascular Disease

## 2022-09-06 DIAGNOSIS — Z95810 Presence of automatic (implantable) cardiac defibrillator: Secondary | ICD-10-CM | POA: Insufficient documentation

## 2022-09-06 DIAGNOSIS — E785 Hyperlipidemia, unspecified: Secondary | ICD-10-CM | POA: Insufficient documentation

## 2022-09-06 DIAGNOSIS — I255 Ischemic cardiomyopathy: Secondary | ICD-10-CM | POA: Insufficient documentation

## 2022-09-06 DIAGNOSIS — I2510 Atherosclerotic heart disease of native coronary artery without angina pectoris: Secondary | ICD-10-CM | POA: Insufficient documentation

## 2022-09-06 DIAGNOSIS — E1169 Type 2 diabetes mellitus with other specified complication: Secondary | ICD-10-CM | POA: Insufficient documentation

## 2022-09-06 DIAGNOSIS — Z9861 Coronary angioplasty status: Secondary | ICD-10-CM | POA: Insufficient documentation

## 2022-09-06 LAB — ECHOCARDIOGRAM COMPLETE
Area-P 1/2: 2.72 cm2
P 1/2 time: 373 msec
S' Lateral: 3.53 cm

## 2022-09-17 ENCOUNTER — Other Ambulatory Visit: Payer: Self-pay | Admitting: Family Medicine

## 2022-09-17 DIAGNOSIS — F4321 Adjustment disorder with depressed mood: Secondary | ICD-10-CM

## 2022-09-17 DIAGNOSIS — I5022 Chronic systolic (congestive) heart failure: Secondary | ICD-10-CM

## 2022-09-24 ENCOUNTER — Ambulatory Visit: Payer: Medicare Other | Attending: Internal Medicine | Admitting: Internal Medicine

## 2022-09-24 ENCOUNTER — Encounter: Payer: Self-pay | Admitting: Internal Medicine

## 2022-09-24 VITALS — BP 100/54 | HR 66 | Ht 61.0 in | Wt 127.0 lb

## 2022-09-24 DIAGNOSIS — Z95810 Presence of automatic (implantable) cardiac defibrillator: Secondary | ICD-10-CM | POA: Diagnosis not present

## 2022-09-24 DIAGNOSIS — I255 Ischemic cardiomyopathy: Secondary | ICD-10-CM

## 2022-09-24 DIAGNOSIS — I5022 Chronic systolic (congestive) heart failure: Secondary | ICD-10-CM

## 2022-09-24 LAB — CUP PACEART INCLINIC DEVICE CHECK
Battery Remaining Longevity: 39 mo
Brady Statistic RA Percent Paced: 8 %
Brady Statistic RV Percent Paced: 96 %
Date Time Interrogation Session: 20240105112712
HighPow Impedance: 78.75 Ohm
Implantable Lead Connection Status: 753985
Implantable Lead Connection Status: 753985
Implantable Lead Connection Status: 753985
Implantable Lead Implant Date: 20180906
Implantable Lead Implant Date: 20180906
Implantable Lead Implant Date: 20180906
Implantable Lead Location: 753858
Implantable Lead Location: 753859
Implantable Lead Location: 753860
Implantable Pulse Generator Implant Date: 20180906
Lead Channel Impedance Value: 1712.5 Ohm
Lead Channel Impedance Value: 475 Ohm
Lead Channel Impedance Value: 600 Ohm
Lead Channel Pacing Threshold Amplitude: 0.75 V
Lead Channel Pacing Threshold Amplitude: 0.75 V
Lead Channel Pacing Threshold Amplitude: 0.75 V
Lead Channel Pacing Threshold Amplitude: 0.75 V
Lead Channel Pacing Threshold Amplitude: 1.25 V
Lead Channel Pacing Threshold Amplitude: 1.25 V
Lead Channel Pacing Threshold Pulse Width: 0.5 ms
Lead Channel Pacing Threshold Pulse Width: 0.5 ms
Lead Channel Pacing Threshold Pulse Width: 0.5 ms
Lead Channel Pacing Threshold Pulse Width: 0.5 ms
Lead Channel Pacing Threshold Pulse Width: 0.5 ms
Lead Channel Pacing Threshold Pulse Width: 0.5 ms
Lead Channel Sensing Intrinsic Amplitude: 1.8 mV
Lead Channel Sensing Intrinsic Amplitude: 11.9 mV
Lead Channel Setting Pacing Amplitude: 2 V
Lead Channel Setting Pacing Amplitude: 2.5 V
Lead Channel Setting Pacing Amplitude: 2.5 V
Lead Channel Setting Pacing Pulse Width: 0.5 ms
Lead Channel Setting Pacing Pulse Width: 0.5 ms
Lead Channel Setting Sensing Sensitivity: 0.5 mV
Pulse Gen Serial Number: 9776461
Zone Setting Status: 755011

## 2022-09-24 NOTE — Progress Notes (Signed)
HPI April Short returns for ongoing evaluation of and followup of her ICD. She is a pleasant 88 yo woman with CAD, s/p MI, severe LV dysfunction, s/p Biv ICD insertion. She has moved to [facility] to be closer to her family. She has not had an ICD shock. She is s/p PCI/stenting in [place]. Her daughter notes that she has some easy bruising. No swelling, ICD shocks or syncope.  No Known Allergies   Current Outpatient Medications  Medication Sig Dispense Refill   albuterol (PROVENTIL) (2.5 MG/3ML) 0.083% nebulizer solution Take 3 mLs (2.5 mg total) by nebulization 3 (three) times daily as needed for wheezing or shortness of breath. 75 mL 1   Alpha-Lipoic Acid 600 MG CAPS Take 1 capsule (600 mg total) by mouth daily.     aspirin EC 81 MG tablet Take 81 mg by mouth daily. Swallow whole.     atorvastatin (LIPITOR) 40 MG tablet TAKE 1 TABLET BY MOUTH EVERY DAY 90 tablet 0   BRILINTA 60 MG TABS tablet TAKE 1 TABLET BY MOUTH TWICE A DAY 60 tablet 8   carvedilol (COREG) 25 MG tablet TAKE 1 TABLET (25 MG TOTAL) BY MOUTH TWICE A DAY WITH MEALS 180 tablet 3   Cholecalciferol (VITAMIN D3) 25 MCG (1000 UT) CAPS Take 1 capsule (1,000 Units total) by mouth daily. 30 capsule    dapagliflozin propanediol (FARXIGA) 5 MG TABS tablet Take 1 tablet (5 mg total) by mouth daily. 30 tablet 6   ENTRESTO 49-51 MG TAKE 1 TABLET BY MOUTH TWICE A DAY 60 tablet 12   sertraline (ZOLOFT) 25 MG tablet TAKE 1 TABLET (25 MG TOTAL) BY MOUTH DAILY. 90 tablet 3   No current facility-administered medications for this visit.     Past Medical History:  Diagnosis Date   Anemia    Arthritis    CAD (coronary artery disease)    a. 10/2015 PCI [facility] [place]): OM (2.5x30 Resolute DES), LCX (3.5x12 Resolute DES).   Cervical cancer ([place]) 1610   Chronic systolic CHF (congestive heart failure) ([place])    a. 01/2016 Echo: EF 20-25%, diff HK, septal-lateral dyssynchrony. Mild MR, mod to sev dil LA, nl RV, mod dil RA,  mod TR, PASP 74mHg.   [place]   DM (diabetes mellitus) ([place]    History of chicken pox    HLD (hyperlipidemia)    Hypertensive heart disease    Ischemic cardiomyopathy    a. 01/2016 Echo: EF 20-25%.   LBBB (left bundle branch block)     ROS:   All systems reviewed and negative except as noted in the HPI.   Past Surgical History:  Procedure Laterality Date   COLON RESECTION  2001   colon cancer   COLONOSCOPY  11/2011   pol[place] x1, [place]   CORONARY STENT PLACEMENT     TOTAL ABDOMINAL HYSTERECTOMY  2005   cervical cancer     Family History  Problem Relation Age of Onset   Colon cancer Mother    Diabetes Sister    Stomach cancer Son    Breast cancer Neg Hx      Social History   Socioeconomic History   Marital status: Widowed    Spouse name: Not on file   Number of children: 7   Years of education: Not on file   Highest education level: Not on file  Occupational History   Not on file  Tobacco Use   Smoking  status: Never   Smokeless tobacco: Never  Substance and Sexual Activity   Alcohol use: Yes    Alcohol/week: 0.0 standard drinks of alcohol    Comment: occasionally   Drug use: No   Sexual activity: Not on file  Other Topics Concern   Not on file  Social History Narrative   Widow 2018, lives with daughter   From [place], lived in [place] then [place].    Moved to [facility] 2021 to be closer to family    Occ: retired   Activity:    Diet:    Social Determinants of Radio broadcast assistant Strain: Low Risk  (06/16/2022)   Overall Financial Resource Strain (CARDIA)    Difficulty of Paying Living Expenses: Not hard at all  Food Insecurity: No Food Insecurity (06/16/2022)   Hunger Vital Sign    Worried About Running Out of Food in the Last Year: Never true    [place] in the Last Year: Never true  Transportation Needs: No Transportation Needs (06/16/2022)   PRAPARE - Hydrologist (Medical): No    Lack  of Transportation (Non-Medical): No  Physical Activity: Insufficiently Active (06/16/2022)   Exercise Vital Sign    Days of Exercise per Week: 3 days    Minutes of Exercise per Session: 30 min  Stress: No Stress Concern Present (06/16/2022)   [place]    Feeling of Stress : Not at all  Social Connections: Socially Isolated (06/16/2022)   Social Connection and Isolation Panel [NHANES]    Frequency of Communication with Friends and Family: More than three times a week    Frequency of Social Gatherings with Friends and Family: Three times a week    Attends Religious Services: Never    Active Member of Clubs or Organizations: No    Attends Archivist Meetings: Never    Marital Status: Widowed  Intimate Partner Violence: Not At Risk (06/16/2022)   Humiliation, Afraid, Rape, and Kick questionnaire    Fear of Current or Ex-Partner: No    Emotionally Abused: No    Physically Abused: No    Sexually Abused: No     BP (!) 100/54   Pulse 66   Ht '5\' 1"'$  (1.549 m)   Wt 127 lb (57.6 kg)   SpO2 96%   BMI 24.00 kg/m   Physical Exam:  Well appearing elderly woman, NAD HEENT: Unremarkable Neck:  No JVD, no thyromegally Lymphatics:  No adenopathy Back:  No CVA tenderness Lungs:  Clear with no wheezes HEART:  Regular rate rhythm, no murmurs, no rubs, no clicks Abd:  soft, positive bowel sounds, no organomegally, no rebound, no guarding Ext:  2 plus pulses, no edema, no cyanosis, no clubbing Skin:  No rashes no nodules Neuro:  CN II through XII intact, motor grossly intact  EKG - none  DEVICE  Normal device function.  See PaceArt for details.   Assess/Plan:  1. ICD - her [place] ICD is working normally. We will recheck in several months. She has an elevated pacing impedence in the LV but her threshold is acceptable. If the impedence goes above 2000 or the threshold rises, I would reprogram her D1 to coil.    2. ICM - she denies anginal symptoms. She is s/p PCI of the OM and LCX. She will continue Brilinta.  3. Easy bruising - her arms have small bruises due to the Brilinta.  Continue.  4. Dyslipidemia - she will continue atorvastatin.   April Short April Mccarry,MD

## 2022-09-24 NOTE — Patient Instructions (Signed)
Medication Instructions:  Your physician recommends that you continue on your current medications as directed. Please refer to the Current Medication list given to you today.  *If you need a refill on your cardiac medications before your next appointment, please call your pharmacy*  Lab Work: None ordered.  If you have labs (blood work) drawn today and your tests are completely normal, you will receive your results only by: [place] (if you have MyChart) OR A paper copy in the mail If you have any lab test that is abnormal or we need to change your treatment, we will call you to review the results.  Testing/Procedures: None ordered.  Follow-Up: At [facility], you and your health needs are our priority.  As part of our continuing mission to provide you with exceptional heart care, we have created designated Provider Care Teams.  These Care Teams include your primary Cardiologist (physician) and Advanced Practice Providers (APPs -  Physician Assistants and Nurse Practitioners) who all work together to provide you with the care you need, when you need it.  We recommend signing up for the patient portal called "MyChart".  Sign up information is provided on this After Visit Summary.  MyChart is used to connect with patients for Virtual Visits (Telemedicine).  Patients are able to view lab/test results, encounter notes, upcoming appointments, etc.  Non-urgent messages can be sent to your provider as well.   To learn more about what you can do with MyChart, go to [email or web address].    Your next appointment:   1 year(s)  The format for your next appointment:   In Person  Provider:   Cristopher Peru, MD{or one of the following Advanced Practice Providers on your designated Care Team:   Tommye Standard, [place] Legrand Como "Jonni Sanger" Chalmers Cater, [place]  Remote monitoring is used to monitor your ICD from home. This monitoring reduces the number of office visits required to check your device to one  time per year. It allows [place] to keep an eye on the functioning of your device to ensure it is working properly. You are scheduled for a device check from home on 10/14/22. You may send your transmission at any time that day. If you have a wireless device, the transmission will be sent automatically. After your physician reviews your transmission, you will receive a postcard with your next transmission date.  Important Information About Sugar

## 2022-09-27 ENCOUNTER — Ambulatory Visit (INDEPENDENT_AMBULATORY_CARE_PROVIDER_SITE_OTHER): Payer: Medicare Other

## 2022-09-27 DIAGNOSIS — I255 Ischemic cardiomyopathy: Secondary | ICD-10-CM

## 2022-09-28 LAB — CUP PACEART REMOTE DEVICE CHECK
Battery Remaining Longevity: 38 mo
Battery Remaining Percentage: 43 %
Battery Voltage: 2.93 V
Brady Statistic AP VP Percent: 1 %
Brady Statistic AP VS Percent: 1 %
Brady Statistic AS VP Percent: 97 %
Brady Statistic AS VS Percent: 2.3 %
Brady Statistic RA Percent Paced: 1.1 %
Date Time Interrogation Session: 20240106101541
HighPow Impedance: 84 Ohm
HighPow Impedance: 84 Ohm
Implantable Lead Connection Status: 753985
Implantable Lead Connection Status: 753985
Implantable Lead Connection Status: 753985
Implantable Lead Implant Date: 20180906
Implantable Lead Implant Date: 20180906
Implantable Lead Implant Date: 20180906
Implantable Lead Location: 753858
Implantable Lead Location: 753859
Implantable Lead Location: 753860
Implantable Pulse Generator Implant Date: 20180906
Lead Channel Impedance Value: 1675 Ohm
Lead Channel Impedance Value: 510 Ohm
Lead Channel Impedance Value: 590 Ohm
Lead Channel Pacing Threshold Amplitude: 0.75 V
Lead Channel Pacing Threshold Amplitude: 0.75 V
Lead Channel Pacing Threshold Amplitude: 1.25 V
Lead Channel Pacing Threshold Pulse Width: 0.5 ms
Lead Channel Pacing Threshold Pulse Width: 0.5 ms
Lead Channel Pacing Threshold Pulse Width: 0.5 ms
Lead Channel Sensing Intrinsic Amplitude: 0.7 mV
Lead Channel Sensing Intrinsic Amplitude: 11.9 mV
Lead Channel Setting Pacing Amplitude: 2 V
Lead Channel Setting Pacing Amplitude: 2.5 V
Lead Channel Setting Pacing Amplitude: 2.5 V
Lead Channel Setting Pacing Pulse Width: 0.5 ms
Lead Channel Setting Pacing Pulse Width: 0.5 ms
Lead Channel Setting Sensing Sensitivity: 0.5 mV
Pulse Gen Serial Number: 9776461
Zone Setting Status: 755011

## 2022-09-30 ENCOUNTER — Ambulatory Visit (INDEPENDENT_AMBULATORY_CARE_PROVIDER_SITE_OTHER): Payer: 59 | Admitting: Podiatry

## 2022-09-30 ENCOUNTER — Encounter: Payer: Self-pay | Admitting: Podiatry

## 2022-09-30 VITALS — BP 116/53 | HR 60

## 2022-09-30 DIAGNOSIS — E1122 Type 2 diabetes mellitus with diabetic chronic kidney disease: Secondary | ICD-10-CM

## 2022-09-30 DIAGNOSIS — N183 Chronic kidney disease, stage 3 unspecified: Secondary | ICD-10-CM | POA: Diagnosis not present

## 2022-09-30 DIAGNOSIS — M79675 Pain in left toe(s): Secondary | ICD-10-CM

## 2022-09-30 DIAGNOSIS — E1142 Type 2 diabetes mellitus with diabetic polyneuropathy: Secondary | ICD-10-CM | POA: Diagnosis not present

## 2022-09-30 DIAGNOSIS — M79674 Pain in right toe(s): Secondary | ICD-10-CM

## 2022-09-30 DIAGNOSIS — M2010 Hallux valgus (acquired), unspecified foot: Secondary | ICD-10-CM | POA: Diagnosis not present

## 2022-09-30 DIAGNOSIS — B351 Tinea unguium: Secondary | ICD-10-CM

## 2022-09-30 NOTE — Progress Notes (Signed)
This patient returns to my office for at risk foot care.  This patient requires this care by a professional since this patient will be at risk due to having diabetes and renal insufficiency and neuropathy. This patient is unable to cut nails himself since the patient cannot reach his nails.These nails are painful walking and wearing shoes.   She is accompanied by her daughter.    This patient presents for at risk foot care today.  General Appearance  Alert, conversant and in no acute stress.  Vascular  Dorsalis pedis are palpable  bilaterally. Posterior tibial pulses are absent  B/L. Capillary return is within normal limits  bilaterally. Cold feet  Bilaterally.  Absent hair digitally.  Neurologic  Senn-Weinstein monofilament wire test diminished bilaterally. Muscle power within normal limits bilaterally.  Nails Thick disfigured discolored nails with subungual debris  from hallux to fifth toes bilaterally. No evidence of bacterial infection or drainage bilaterally.  Orthopedic  No limitations of motion  feet .  No crepitus or effusions noted.  No bony pathology or digital deformities noted. HAV  B/L.  Skin  normotropic skin with no porokeratosis noted bilaterally.  No signs of infections or ulcers noted.     Onychomycosis  Pain in right toes  Pain in left toes  HAV  B/L.  Consent was obtained for treatment procedures.   Mechanical debridement of nails 1-5  bilaterally performed with a nail nipper.  Filed with dremel without incident. Patient  had LOPS tested and there was no evidence of neuropathy.   Return office visit  3 months.                   Told patient to return for periodic foot care and evaluation due to potential at risk complications.   Gardiner Barefoot DPM

## 2022-10-10 ENCOUNTER — Other Ambulatory Visit: Payer: Self-pay | Admitting: Family Medicine

## 2022-10-10 DIAGNOSIS — E1169 Type 2 diabetes mellitus with other specified complication: Secondary | ICD-10-CM

## 2022-10-25 ENCOUNTER — Ambulatory Visit: Payer: Medicare Other | Admitting: Dietician

## 2022-10-29 NOTE — Progress Notes (Signed)
Remote ICD transmission.   

## 2022-11-24 ENCOUNTER — Encounter: Payer: 59 | Attending: Family Medicine | Admitting: Skilled Nursing Facility1

## 2022-11-24 ENCOUNTER — Encounter: Payer: Self-pay | Admitting: Skilled Nursing Facility1

## 2022-11-24 DIAGNOSIS — Z713 Dietary counseling and surveillance: Secondary | ICD-10-CM | POA: Insufficient documentation

## 2022-11-24 DIAGNOSIS — E1142 Type 2 diabetes mellitus with diabetic polyneuropathy: Secondary | ICD-10-CM | POA: Diagnosis not present

## 2022-11-24 DIAGNOSIS — M810 Age-related osteoporosis without current pathological fracture: Secondary | ICD-10-CM | POA: Diagnosis not present

## 2022-11-24 DIAGNOSIS — E1169 Type 2 diabetes mellitus with other specified complication: Secondary | ICD-10-CM | POA: Diagnosis not present

## 2022-11-24 DIAGNOSIS — E785 Hyperlipidemia, unspecified: Secondary | ICD-10-CM | POA: Diagnosis not present

## 2022-11-24 DIAGNOSIS — Z6824 Body mass index (BMI) 24.0-24.9, adult: Secondary | ICD-10-CM | POA: Diagnosis not present

## 2022-11-24 DIAGNOSIS — E1122 Type 2 diabetes mellitus with diabetic chronic kidney disease: Secondary | ICD-10-CM

## 2022-11-24 NOTE — Progress Notes (Signed)
Labs: Triglycerides 153 A1C 8.2   Lives with her daughter.  Pt was unable to speak for herself and offer information; pt mostly spoke of her childhood in the present tense. Pts daughters offered recall and context.   Due to pts cognitive state instructions were given to her daughters with the context being in weight management as hypoglycemia and wasting would be the main concerns for their mothers current cognitive state lending to an inability to understand her hunger/satiety cues.   Pts daughters are taking great care of the pt offering all the foods she still enjoys.   Pts daughter states the pts weight has been stable around 123 pounds. Pts daughter states she has some memory lapses forgetting whether she eats or not. Pts daughter states the doctor advised it is not necessary to check her blood sugar daily. Pts daughter states she goes to the doctor every 3 months and does see the podiatrist.  Pts daughter states she sometimes gets up late. Pts daughter states Sometimes will say I am full I already ate but she has not eaten. Pts daughter states she is missing teeth so hard to eat certain foods sometimes.  Pts daughter states they set out a lot of different foods for their mother to choose from to ensure she eats something.  Pt presents with sever cognitive impairment including forgetting she ate or has not eaten with aggression towards her family sometimes and waking throughout the night to pace.    24 hr recall:  Breakfast: 1/2 to whole Glucerna + tortilla + small beans beans  + 1/2 egg Snack: 1 whole banana Lunch 12-1:00-1:30: cream or potato soup or rice and meat and beans Snack: fruit Dinner: soup: broccoli and  Snack: whole milk  Beverages: water, cranberry juice, other juice, whole   All materials given in [place]. Advised pts daughters to offer pt glucerna and low sugar carnation breakfast in whole milk and thrive as she is not eating enough calories from solid foods to  meet her needs. Advised to think of solid foods for pleasure and the supplements for nutrition. Pt currently only eating sweet items which is usual per her cognitive decline.  As pt still enjoys vegetables Dietitian advised pts daughters to offer edamame.   Diabetes Self-Management Education  Visit Type: First/Initial   11/25/2022  Ms. April Short, identified by name and date of birth, is a 88 y.o. female with a diagnosis of Diabetes: Type 2.   ASSESSMENT  There were no vitals taken for this visit. There is no height or weight on file to calculate BMI.   Diabetes Self-Management Education - 11/25/22 1400       Visit Information   Visit Type First/Initial      Initial Visit   Diabetes Type Type 2    Are you currently following a meal plan? No    Are you taking your medications as prescribed? Not on Medications      Health Coping   How would you rate your overall health? Fair      Psychosocial Assessment   Patient Belief/Attitude about Diabetes Other (comment)    What is the hardest part about your diabetes right now, causing you the most concern, or is the most worrisome to you about your diabetes?   Making healty food and beverage choices    Self-care barriers Debilitated state due to current medical condition;English as a second language    Self-management support Family    Other persons present Family Member  Patient Concerns Weight Control    Special Needs Instruct caregiver    Preferred Learning Style Auditory    Learning Readiness Ready    How often do you need to have someone help you when you read instructions, pamphlets, or other written materials from your doctor or pharmacy? 5 - Always      Pre-Education Assessment   Patient understands the diabetes disease and treatment process. Needs Instruction    Patient understands incorporating nutritional management into lifestyle. Needs Instruction    Patient undertands incorporating physical activity into lifestyle.  Needs Instruction    Patient understands using medications safely. Needs Instruction    Patient understands monitoring blood glucose, interpreting and using results Needs Instruction    Patient understands prevention, detection, and treatment of acute complications. Needs Instruction    Patient understands prevention, detection, and treatment of chronic complications. Needs Instruction    Patient understands how to develop strategies to address psychosocial issues. Needs Instruction    Patient understands how to develop strategies to promote health/change behavior. Needs Instruction      Complications   Last HgB A1C per patient/outside source 8.2 %    How often do you check your blood sugar? 0 times/day (not testing)    Have you had a dilated eye exam in the past 12 months? Yes    Have you had a dental exam in the past 12 months? Yes    Are you checking your feet? Yes    How many days per week are you checking your feet? 7      Activity / Exercise   Activity / Exercise Type Light (walking / raking leaves)    How many days per week do you exercise? 7    How many minutes per day do you exercise? 15    Total minutes per week of exercise 105      Patient Education   Previous Diabetes Education No    Healthy Eating Role of diet in the treatment of diabetes and the relationship between the three main macronutrients and blood glucose level;Plate Method;Reviewed blood glucose goals for pre and post meals and how to evaluate the patients' food intake on their blood glucose level.;Information on hints to eating out and maintain blood glucose control.    Acute complications Taught prevention, symptoms, and  treatment of hypoglycemia - the 15 rule.;Discussed and identified patients' prevention, symptoms, and treatment of hyperglycemia.    Chronic complications Dental care;Retinopathy and reason for yearly dilated eye exams;Nephropathy, what it is, prevention of, the use of ACE, ARB's and early  detection of through urine microalbumia.;Assessed and discussed foot care and prevention of foot problems    Diabetes Stress and Support Worked with patient to identify barriers to care and solutions;Role of stress on diabetes      Individualized Goals (developed by patient)   Nutrition Follow meal plan discussed;General guidelines for healthy choices and portions discussed    Physical Activity Exercise 3-5 times per week;30 minutes per day    Medications Not Applicable    Problem Solving Eating Pattern    Reducing Risk do foot checks daily;treat hypoglycemia with 15 grams of carbs if blood glucose less than 70mg /dL      Post-Education Assessment   Patient understands the diabetes disease and treatment process. Demonstrates understanding / competency    Patient understands incorporating nutritional management into lifestyle. Demonstrates understanding / competency    Patient undertands incorporating physical activity into lifestyle. Demonstrates understanding / competency    Patient  understands using medications safely. Demonstrates understanding / competency    Patient understands monitoring blood glucose, interpreting and using results Demonstrates understanding / competency    Patient understands prevention, detection, and treatment of acute complications. Demonstrates understanding / competency    Patient understands prevention, detection, and treatment of chronic complications. Demonstrates understanding / competency    Patient understands how to develop strategies to address psychosocial issues. Demonstrates understanding / competency    Patient understands how to develop strategies to promote health/change behavior. Demonstrates understanding / competency      Outcomes   Future DMSE PRN    Program Status Completed             Individualized Plan for Diabetes Self-Management Training:   Learning Objective:  Patient will have a greater understanding of diabetes  self-management. Patient education plan is to attend individual and/or group sessions per assessed needs and concerns.    Expected Outcomes:     Education material provided: ADA - How to Thrive: A Guide for Your Journey with Diabetes, Food label handouts, and My Plate  If problems or questions, patient to contact team via:  Phone and Email  Future DSME appointment: PRN

## 2022-12-03 ENCOUNTER — Other Ambulatory Visit: Payer: Self-pay | Admitting: Family Medicine

## 2022-12-03 ENCOUNTER — Ambulatory Visit (INDEPENDENT_AMBULATORY_CARE_PROVIDER_SITE_OTHER): Payer: 59 | Admitting: Family Medicine

## 2022-12-03 ENCOUNTER — Encounter: Payer: Self-pay | Admitting: Family Medicine

## 2022-12-03 VITALS — BP 104/62 | HR 66 | Temp 97.1°F | Ht 61.0 in | Wt 128.5 lb

## 2022-12-03 DIAGNOSIS — Z85038 Personal history of other malignant neoplasm of large intestine: Secondary | ICD-10-CM | POA: Diagnosis not present

## 2022-12-03 DIAGNOSIS — E1142 Type 2 diabetes mellitus with diabetic polyneuropathy: Secondary | ICD-10-CM

## 2022-12-03 DIAGNOSIS — R413 Other amnesia: Secondary | ICD-10-CM

## 2022-12-03 DIAGNOSIS — R1013 Epigastric pain: Secondary | ICD-10-CM | POA: Diagnosis not present

## 2022-12-03 DIAGNOSIS — Z8619 Personal history of other infectious and parasitic diseases: Secondary | ICD-10-CM | POA: Insufficient documentation

## 2022-12-03 DIAGNOSIS — B179 Acute viral hepatitis, unspecified: Secondary | ICD-10-CM | POA: Diagnosis not present

## 2022-12-03 DIAGNOSIS — F4321 Adjustment disorder with depressed mood: Secondary | ICD-10-CM

## 2022-12-03 DIAGNOSIS — I5022 Chronic systolic (congestive) heart failure: Secondary | ICD-10-CM | POA: Diagnosis not present

## 2022-12-03 DIAGNOSIS — R1010 Upper abdominal pain, unspecified: Secondary | ICD-10-CM | POA: Insufficient documentation

## 2022-12-03 DIAGNOSIS — Z8541 Personal history of malignant neoplasm of cervix uteri: Secondary | ICD-10-CM

## 2022-12-03 DIAGNOSIS — R1110 Vomiting, unspecified: Secondary | ICD-10-CM | POA: Diagnosis not present

## 2022-12-03 DIAGNOSIS — R8290 Unspecified abnormal findings in urine: Secondary | ICD-10-CM

## 2022-12-03 DIAGNOSIS — R1111 Vomiting without nausea: Secondary | ICD-10-CM

## 2022-12-03 LAB — COMPREHENSIVE METABOLIC PANEL
ALT: 1121 U/L — ABNORMAL HIGH (ref 0–35)
AST: 575 U/L — ABNORMAL HIGH (ref 0–37)
Albumin: 4 g/dL (ref 3.5–5.2)
Alkaline Phosphatase: 283 U/L — ABNORMAL HIGH (ref 39–117)
BUN: 28 mg/dL — ABNORMAL HIGH (ref 6–23)
CO2: 27 mEq/L (ref 19–32)
Calcium: 9.6 mg/dL (ref 8.4–10.5)
Chloride: 100 mEq/L (ref 96–112)
Creatinine, Ser: 1.34 mg/dL — ABNORMAL HIGH (ref 0.40–1.20)
GFR: 36 mL/min — ABNORMAL LOW (ref >60.00)
Glucose, Bld: 252 mg/dL — ABNORMAL HIGH (ref 70–99)
Potassium: 4.6 mEq/L (ref 3.5–5.1)
Sodium: 135 mEq/L (ref 135–145)
Total Bilirubin: 1 mg/dL (ref 0.2–1.2)
Total Protein: 8.2 g/dL (ref 6.0–8.3)

## 2022-12-03 LAB — CBC WITH DIFFERENTIAL/PLATELET
Basophils Absolute: 0 10*3/uL (ref 0.0–0.1)
Basophils Relative: 0.8 % (ref 0.0–3.0)
Eosinophils Absolute: 0.2 10*3/uL (ref 0.0–0.7)
Eosinophils Relative: 2.9 % (ref 0.0–5.0)
HCT: 39 % (ref 36.0–46.0)
Hemoglobin: 12.9 g/dL (ref 12.0–15.0)
Lymphocytes Relative: 20 % (ref 12.0–46.0)
Lymphs Abs: 1.1 10*3/uL (ref 0.7–4.0)
MCHC: 33 g/dL (ref 30.0–36.0)
MCV: 88.3 fl (ref 78.0–100.0)
Monocytes Absolute: 0.4 10*3/uL (ref 0.1–1.0)
Monocytes Relative: 7.7 % (ref 3.0–12.0)
Neutro Abs: 3.7 10*3/uL (ref 1.4–7.7)
Neutrophils Relative %: 68.6 % (ref 43.0–77.0)
Platelets: 166 10*3/uL (ref 150.0–400.0)
RBC: 4.41 Mil/uL (ref 3.87–5.11)
RDW: 15.3 % (ref 11.5–15.5)
WBC: 5.4 10*3/uL (ref 4.0–10.5)

## 2022-12-03 LAB — LIPASE: Lipase: 29 U/L (ref 11.0–59.0)

## 2022-12-03 LAB — POC URINALSYSI DIPSTICK (AUTOMATED)
Bilirubin, UA: NEGATIVE
Glucose, UA: POSITIVE — AB
Ketones, UA: NEGATIVE
Nitrite, UA: NEGATIVE
Protein, UA: POSITIVE — AB
Spec Grav, UA: 1.02 (ref 1.010–1.025)
Urobilinogen, UA: 0.2 E.U./dL
pH, UA: 5 (ref 5.0–8.0)

## 2022-12-03 LAB — HEMOGLOBIN A1C: Hgb A1c MFr Bld: 8.9 % — ABNORMAL HIGH (ref 4.6–6.5)

## 2022-12-03 NOTE — Patient Instructions (Addendum)
Laboratorios hoy Trate de buscar  fuente de proteina con cada comida.  Regresar en 3 meses para proxima visita.  Gusto verla hoy.

## 2022-12-03 NOTE — Progress Notes (Unsigned)
Patient ID: April Short, female    DOB: 01/09/1935, 88 y.o.   MRN: [id number]  This visit was conducted in person.  BP 104/62   Pulse 66   Temp (!) 97.1 F (36.2 C) (Temporal)   Ht 5\' 1"  (1.549 m)   Wt 128 lb 8 oz (58.3 kg)   SpO2 93%   BMI 24.28 kg/m   BP Readings from Last 3 Encounters:  12/03/22 104/62  09/30/22 (!) 116/53  09/24/22 (!) 100/54    CC: vomiting and anorexia Subjective:   HPI: April Short is a 88 y.o. female presenting on 12/03/2022 for Emesis (C/o vomiting and loss of appetite. Pt accompanied by daughters, Constance Holster and Regino Schultze. )   Here with daughters to discuss poor appetite associated with occasional vomiting.  She states she feels well.  Weight overall stable.  Family does endorse early satiety.  Family thinks she's lost taste.  No recent COVID infection.   Episode of vomiting last week - vomited x2, NBNB. No nausea, fevers/chills, abd pain, diarrhea, constipation, dysphagia. No new rashes.   She notes ongoing difficulty with hand numbness. No foot numbness, no neuropathy but hands feel "swollen".   H/o cervical cancer, colon cancer. 9  Known CAD and iCM s/p BiV ICD on entresto, farxiga and brilinta. Also takes carvedilol, aspirin and atorvastatin daily. Sees EP and cardiology.  Diabetic only on farxiga - dose limited by urinary incontinence - she saw diabetes education/nutrition last week.  Lab Results  Component Value Date   HGBA1C 8.2 (H) 08/09/2022     One daughter wonders if generic dapagliflozin may be contributing to symptoms as she seems to do better with brand Farxiga 5mg .      Relevant past medical, surgical, family and social history reviewed and updated as indicated. Interim medical history since our last visit reviewed. Allergies and medications reviewed and updated. Outpatient Medications Prior to Visit  Medication Sig Dispense Refill   albuterol (PROVENTIL) (2.5 MG/3ML) 0.083% nebulizer solution Take 3 mLs (2.5 mg total)  by nebulization 3 (three) times daily as needed for wheezing or shortness of breath. 75 mL 1   Alpha-Lipoic Acid 600 MG CAPS Take 1 capsule (600 mg total) by mouth daily.     aspirin EC 81 MG tablet Take 81 mg by mouth daily. Swallow whole.     atorvastatin (LIPITOR) 40 MG tablet TAKE 1 TABLET BY MOUTH EVERY DAY 90 tablet 2   BRILINTA 60 MG TABS tablet TAKE 1 TABLET BY MOUTH TWICE A DAY 60 tablet 8   carvedilol (COREG) 25 MG tablet TAKE 1 TABLET (25 MG TOTAL) BY MOUTH TWICE A DAY WITH MEALS 180 tablet 3   Cholecalciferol (VITAMIN D3) 25 MCG (1000 UT) CAPS Take 1 capsule (1,000 Units total) by mouth daily. 30 capsule    dapagliflozin propanediol (FARXIGA) 5 MG TABS tablet Take 1 tablet (5 mg total) by mouth daily. 30 tablet 6   ENTRESTO 49-51 MG TAKE 1 TABLET BY MOUTH TWICE A DAY 60 tablet 12   sertraline (ZOLOFT) 25 MG tablet TAKE 1 TABLET (25 MG TOTAL) BY MOUTH DAILY. 90 tablet 3   No facility-administered medications prior to visit.     Per HPI unless specifically indicated in ROS section below Review of Systems  Objective:  BP 104/62   Pulse 66   Temp (!) 97.1 F (36.2 C) (Temporal)   Ht 5\' 1"  (1.549 m)   Wt 128 lb 8 oz (58.3 kg)   SpO2 93%  BMI 24.28 kg/m   Wt Readings from Last 3 Encounters:  12/03/22 128 lb 8 oz (58.3 kg)  09/24/22 127 lb (57.6 kg)  08/24/22 123 lb 3.2 oz (55.9 kg)      Physical Exam Vitals and nursing note reviewed.  Constitutional:      Appearance: Normal appearance. She is not ill-appearing.  HENT:     Mouth/Throat:     Mouth: Mucous membranes are moist.     Pharynx: Oropharynx is clear. No oropharyngeal exudate or posterior oropharyngeal erythema.  Eyes:     Extraocular Movements: Extraocular movements intact.     Pupils: Pupils are equal, round, and reactive to light.  Cardiovascular:     Rate and Rhythm: Normal rate and regular rhythm.     Pulses: Normal pulses.     Heart sounds: Normal heart sounds. No murmur heard. Pulmonary:      Effort: Pulmonary effort is normal. No respiratory distress.     Breath sounds: Normal breath sounds. No wheezing, rhonchi or rales.  Abdominal:     General: Bowel sounds are normal. There is no distension.     Palpations: Abdomen is soft. There is no mass.     Tenderness: Tenderness: mild. There is no right CVA tenderness, left CVA tenderness, guarding or rebound. Negative signs include Murphy's sign.     Hernia: No hernia is present.  Musculoskeletal:     Right lower leg: No edema.     Left lower leg: No edema.  Skin:    General: Skin is warm and dry.     Findings: No rash.  Neurological:     Mental Status: She is alert.  Psychiatric:        Mood and Affect: Mood normal.        Behavior: Behavior normal.        Lab Results  Component Value Date   HGBA1C 8.2 (H) 08/09/2022    Lab Results  Component Value Date   CREATININE 0.97 08/09/2022   BUN 25 (H) 08/09/2022   NA 137 08/09/2022   K 4.9 08/09/2022   CL 102 08/09/2022   CO2 27 08/09/2022   GFR = 53  Assessment & Plan:   Problem List Items Addressed This Visit     Type 2 diabetes mellitus with peripheral neuropathy (HCC)   Relevant Orders   Hemoglobin A1c   Epigastric discomfort - Primary   Relevant Orders   Comprehensive metabolic panel   CBC with Differential/Platelet   Lipase   History of colon cancer   Relevant Orders   Fecal occult blood, imunochemical   History of cervical cancer   RESOLVED: Vomiting   Relevant Orders   POCT Urinalysis Dipstick (Automated) (Completed)   Urine Culture   Other Visit Diagnoses     Abnormal urinalysis       Relevant Orders   Urine Culture        No orders of the defined types were placed in this encounter.   Orders Placed This Encounter  Procedures   Fecal occult blood, imunochemical    Standing Status:   Future    Standing Expiration Date:   12/03/2023   Urine Culture   Comprehensive metabolic panel   Hemoglobin A1c   CBC with Differential/Platelet    Lipase   POCT Urinalysis Dipstick (Automated)    Patient Instructions  Laboratorios hoy Trate de buscar  fuente de proteina con cada comida.  Regresar en 3 meses para proxima visita.  Gusto verla hoy.  Follow up plan: Return in about 3 months (around 03/05/2023) for follow up visit.  Ria Bush, MD

## 2022-12-04 NOTE — Assessment & Plan Note (Addendum)
Overall stable period on sertraline 25mg  daily.  Family does notice intermittent insomnia as well as anorexia - discussed option of remeron - will defer for now.

## 2022-12-04 NOTE — Assessment & Plan Note (Addendum)
Seems euvolemic - continue entresto carvedilol and farxiga.

## 2022-12-04 NOTE — Assessment & Plan Note (Signed)
Update A1c today - worse despite farxiga 5mg  daily however higher dose limited by urinary incontinence.  Will further work-up newly found hepatitis before additional diabetes medication.

## 2022-12-04 NOTE — Assessment & Plan Note (Addendum)
6CIT score 17 last year suspicious for memory impairment- will need to return for formal memory evaluation.

## 2022-12-04 NOTE — Assessment & Plan Note (Addendum)
Marked transaminitis predominant ALT on labwork, Tbili increased but still normal range. Encouraged conservative management with sips of fluids, avoid tylenol, and drop atorvastatin to twice weekly. Will see if we can add acute viral hepatitis panel to blood in lab, and I've asked her to return next week for rpt LFTs. If not improving, in personal hx colon cancer, will check abdominal imaging.

## 2022-12-04 NOTE — Assessment & Plan Note (Addendum)
New, mild on exam, associated with anorexia and limited episodes of vomiting last week.  Check labwork today for further evaluation (lipase, CMP, CBC).  In personal history of colon and cervical cancer, if not improving low threshold to check abdominal imaging.

## 2022-12-05 LAB — URINE CULTURE
MICRO NUMBER:: 14698753
SPECIMEN QUALITY:: ADEQUATE

## 2022-12-06 ENCOUNTER — Other Ambulatory Visit (INDEPENDENT_AMBULATORY_CARE_PROVIDER_SITE_OTHER): Payer: 59

## 2022-12-06 DIAGNOSIS — B179 Acute viral hepatitis, unspecified: Secondary | ICD-10-CM | POA: Diagnosis not present

## 2022-12-06 DIAGNOSIS — Z85038 Personal history of other malignant neoplasm of large intestine: Secondary | ICD-10-CM | POA: Diagnosis not present

## 2022-12-06 LAB — COMPREHENSIVE METABOLIC PANEL
ALT: 322 U/L — ABNORMAL HIGH (ref 0–35)
AST: 53 U/L — ABNORMAL HIGH (ref 0–37)
Albumin: 3.9 g/dL (ref 3.5–5.2)
Alkaline Phosphatase: 180 U/L — ABNORMAL HIGH (ref 39–117)
BUN: 29 mg/dL — ABNORMAL HIGH (ref 6–23)
CO2: 25 mEq/L (ref 19–32)
Calcium: 9.8 mg/dL (ref 8.4–10.5)
Chloride: 99 mEq/L (ref 96–112)
Creatinine, Ser: 1.38 mg/dL — ABNORMAL HIGH (ref 0.40–1.20)
GFR: 34.75 mL/min — ABNORMAL LOW (ref >60.00)
Glucose, Bld: 286 mg/dL — ABNORMAL HIGH (ref 70–99)
Potassium: 4.5 mEq/L (ref 3.5–5.1)
Sodium: 134 mEq/L — ABNORMAL LOW (ref 135–145)
Total Bilirubin: 0.7 mg/dL (ref 0.2–1.2)
Total Protein: 7.9 g/dL (ref 6.0–8.3)

## 2022-12-07 LAB — HEPATITIS PANEL, ACUTE
Hep A IgM: NONREACTIVE
Hep B C IgM: NONREACTIVE
Hepatitis B Surface Ag: NONREACTIVE
Hepatitis C Ab: NONREACTIVE

## 2022-12-08 ENCOUNTER — Other Ambulatory Visit: Payer: Self-pay | Admitting: Family Medicine

## 2022-12-08 LAB — FECAL OCCULT BLOOD, IMMUNOCHEMICAL: Fecal Occult Bld: NEGATIVE

## 2022-12-08 MED ORDER — CEPHALEXIN 500 MG PO CAPS: 500.0000 mg | ORAL_CAPSULE | Freq: Two times a day (BID) | ORAL | 0 refills | Status: AC

## 2022-12-08 NOTE — Addendum Note (Signed)
Addended by: Ria Bush on: 12/08/2022 06:03 PM   Modules accepted: Orders

## 2022-12-23 ENCOUNTER — Other Ambulatory Visit: Payer: Self-pay | Admitting: Family Medicine

## 2022-12-24 NOTE — Telephone Encounter (Signed)
Refill request April Short Last refill 05/11/22 #30/6 Last office visit 12/03/22

## 2022-12-27 ENCOUNTER — Ambulatory Visit (INDEPENDENT_AMBULATORY_CARE_PROVIDER_SITE_OTHER): Payer: 59

## 2022-12-27 DIAGNOSIS — I255 Ischemic cardiomyopathy: Secondary | ICD-10-CM

## 2022-12-27 DIAGNOSIS — I5022 Chronic systolic (congestive) heart failure: Secondary | ICD-10-CM

## 2022-12-27 LAB — CUP PACEART REMOTE DEVICE CHECK
Battery Remaining Longevity: 36 mo
Battery Remaining Percentage: 40 %
Battery Voltage: 2.93 V
Brady Statistic AP VP Percent: 12 %
Brady Statistic AP VS Percent: 1 %
Brady Statistic AS VP Percent: 84 %
Brady Statistic AS VS Percent: 4.3 %
Brady Statistic RA Percent Paced: 12 %
Date Time Interrogation Session: 20240408040016
HighPow Impedance: 86 Ohm
HighPow Impedance: 86 Ohm
Implantable Lead Connection Status: 753985
Implantable Lead Connection Status: 753985
Implantable Lead Connection Status: 753985
Implantable Lead Implant Date: 20180906
Implantable Lead Implant Date: 20180906
Implantable Lead Implant Date: 20180906
Implantable Lead Location: 753858
Implantable Lead Location: 753859
Implantable Lead Location: 753860
Implantable Pulse Generator Implant Date: 20180906
Lead Channel Impedance Value: 1775 Ohm
Lead Channel Impedance Value: 460 Ohm
Lead Channel Impedance Value: 610 Ohm
Lead Channel Pacing Threshold Amplitude: 0.75 V
Lead Channel Pacing Threshold Amplitude: 0.75 V
Lead Channel Pacing Threshold Amplitude: 1.25 V
Lead Channel Pacing Threshold Pulse Width: 0.5 ms
Lead Channel Pacing Threshold Pulse Width: 0.5 ms
Lead Channel Pacing Threshold Pulse Width: 0.5 ms
Lead Channel Sensing Intrinsic Amplitude: 1 mV
Lead Channel Sensing Intrinsic Amplitude: 11.9 mV
Lead Channel Setting Pacing Amplitude: 2 V
Lead Channel Setting Pacing Amplitude: 2.5 V
Lead Channel Setting Pacing Amplitude: 2.5 V
Lead Channel Setting Pacing Pulse Width: 0.5 ms
Lead Channel Setting Pacing Pulse Width: 0.5 ms
Lead Channel Setting Sensing Sensitivity: 0.5 mV
Pulse Gen Serial Number: 9776461
Zone Setting Status: 755011

## 2022-12-30 ENCOUNTER — Encounter: Payer: Self-pay | Admitting: Podiatry

## 2022-12-30 ENCOUNTER — Ambulatory Visit (INDEPENDENT_AMBULATORY_CARE_PROVIDER_SITE_OTHER): Payer: 59 | Admitting: Podiatry

## 2022-12-30 VITALS — BP 138/54 | HR 60

## 2022-12-30 DIAGNOSIS — B351 Tinea unguium: Secondary | ICD-10-CM | POA: Diagnosis not present

## 2022-12-30 DIAGNOSIS — M79674 Pain in right toe(s): Secondary | ICD-10-CM

## 2022-12-30 DIAGNOSIS — M79675 Pain in left toe(s): Secondary | ICD-10-CM

## 2022-12-30 DIAGNOSIS — E1142 Type 2 diabetes mellitus with diabetic polyneuropathy: Secondary | ICD-10-CM | POA: Diagnosis not present

## 2022-12-30 NOTE — Progress Notes (Signed)
This patient returns to my office for at risk foot care.  This patient requires this care by a professional since this patient will be at risk due to having diabetes and renal insufficiency and neuropathy. This patient is unable to cut nails himself since the patient cannot reach his nails.These nails are painful walking and wearing shoes.   She is accompanied by her daughter.    This patient presents for at risk foot care today.  General Appearance  Alert, conversant and in no acute stress.  Vascular  Dorsalis pedis are palpable  bilaterally. Posterior tibial pulses are absent  B/L. Capillary return is within normal limits  bilaterally. Cold feet  Bilaterally.  Absent hair digitally.  Neurologic  Senn-Weinstein monofilament wire test diminished bilaterally. Muscle power within normal limits bilaterally.  Nails Thick disfigured discolored nails with subungual debris  from hallux to fifth toes bilaterally. No evidence of bacterial infection or drainage bilaterally.  Orthopedic  No limitations of motion  feet .  No crepitus or effusions noted.  No bony pathology or digital deformities noted. HAV  B/L.  Skin  normotropic skin with no porokeratosis noted bilaterally.  No signs of infections or ulcers noted.     Onychomycosis  Pain in right toes  Pain in left toes  HAV  B/L.  Consent was obtained for treatment procedures.   Mechanical debridement of nails 1-5  bilaterally performed with a nail nipper.  Filed with dremel without incident. Patient  had LOPS tested and there was no evidence of neuropathy.   Return office visit  3 months.                   Told patient to return for periodic foot care and evaluation due to potential at risk complications.   Kaysan Peixoto DPM  

## 2023-01-06 ENCOUNTER — Other Ambulatory Visit: Payer: 59

## 2023-01-10 ENCOUNTER — Telehealth: Payer: Self-pay

## 2023-01-10 NOTE — Telephone Encounter (Signed)
Please start new PA for Prolia.  Her last inj was on 07/13/2022.  Please respond to Barnie Mort at [facility].  I am just filling in for the week.  Thank you!

## 2023-01-10 NOTE — Telephone Encounter (Signed)
Created new encounter for Prolia BIV

## 2023-01-10 NOTE — Telephone Encounter (Signed)
Prolia VOB initiated via [email or web address] 

## 2023-01-12 ENCOUNTER — Other Ambulatory Visit (HOSPITAL_COMMUNITY): Payer: Self-pay

## 2023-01-12 NOTE — Telephone Encounter (Signed)
Pt ready for scheduling for Prolia on or after : 01/12/23  Out-of-pocket cost due at time of visit: $327  Primary: [employer]are Prolia co-insurance: 20% Admin fee co-insurance: 20%  Secondary: Medicaid - Plan exclusion Prolia co-insurance:  Admin fee co-insurance:   Medical Benefit Details: Date Benefits were checked: 01/11/23 Deductible: $240 met of $240 required/ Coinsurance: 20%/ Admin Fee: 20%  Prior Auth: APPROVED PA# [id number] Expiration Date: 07/07/2023   Pharmacy benefit: Copay $0 If patient wants fill through the pharmacy benefit please send prescription to: [email or web address], and include estimated need by date in rx notes. Pharmacy will ship medication directly to the office.  Patient not eligible for Prolia Copay Card. Copay Card can make patient's cost as little as $25. Link to apply: [email or web address]  ** This summary of benefits is an estimation of the patient's out-of-pocket cost. Exact cost may very based on individual plan coverage.

## 2023-01-18 ENCOUNTER — Other Ambulatory Visit: Payer: Self-pay

## 2023-01-18 DIAGNOSIS — M810 Age-related osteoporosis without current pathological fracture: Secondary | ICD-10-CM

## 2023-01-18 MED ORDER — DENOSUMAB 60 MG/ML ~~LOC~~ SOSY: 60.0000 mg | PREFILLED_SYRINGE | Freq: Once | SUBCUTANEOUS | 0 refills | Status: AC

## 2023-01-18 NOTE — Telephone Encounter (Signed)
Called patient reviewed all following information including appointment, Co pay due at time of visit and if pick up of injection is needed from outside pharmacy.     Out of pocket for patient:  $0   Lab appointment : 01/28/23  Nurse visit:  02/03/23  Lab order placed: Yes  Prolia has been  []   Ordered  []   Script sent to local pharmacy for patient to bring   [x]   Script sent to [facility]   []   Script sent to [facility] to deliver

## 2023-01-26 ENCOUNTER — Ambulatory Visit
Admission: RE | Admit: 2023-01-26 | Discharge: 2023-01-26 | Disposition: A | Discharge status: Home / Self Care | Payer: 59 | Source: Ambulatory Visit | Attending: Family Medicine | Admitting: Family Medicine

## 2023-01-26 DIAGNOSIS — K8020 Calculus of gallbladder without cholecystitis without obstruction: Secondary | ICD-10-CM | POA: Diagnosis not present

## 2023-01-26 DIAGNOSIS — R109 Unspecified abdominal pain: Secondary | ICD-10-CM | POA: Diagnosis not present

## 2023-01-26 DIAGNOSIS — R1111 Vomiting without nausea: Secondary | ICD-10-CM

## 2023-01-26 DIAGNOSIS — R1013 Epigastric pain: Secondary | ICD-10-CM

## 2023-01-26 DIAGNOSIS — B179 Acute viral hepatitis, unspecified: Secondary | ICD-10-CM

## 2023-01-28 ENCOUNTER — Other Ambulatory Visit (INDEPENDENT_AMBULATORY_CARE_PROVIDER_SITE_OTHER): Payer: 59

## 2023-01-28 DIAGNOSIS — M810 Age-related osteoporosis without current pathological fracture: Secondary | ICD-10-CM | POA: Diagnosis not present

## 2023-01-28 LAB — BASIC METABOLIC PANEL
BUN: 26 mg/dL — ABNORMAL HIGH (ref 6–23)
CO2: 24 mEq/L (ref 19–32)
Calcium: 9.5 mg/dL (ref 8.4–10.5)
Chloride: 105 mEq/L (ref 96–112)
Creatinine, Ser: 1.16 mg/dL (ref 0.40–1.20)
GFR: 42.76 mL/min — ABNORMAL LOW (ref >60.00)
Glucose, Bld: 201 mg/dL — ABNORMAL HIGH (ref 70–99)
Potassium: 4.8 mEq/L (ref 3.5–5.1)
Sodium: 137 mEq/L (ref 135–145)

## 2023-02-01 NOTE — Progress Notes (Signed)
Remote ICD transmission.   

## 2023-02-03 ENCOUNTER — Ambulatory Visit: Payer: 59

## 2023-02-10 ENCOUNTER — Ambulatory Visit (INDEPENDENT_AMBULATORY_CARE_PROVIDER_SITE_OTHER): Payer: 59

## 2023-02-10 DIAGNOSIS — M810 Age-related osteoporosis without current pathological fracture: Secondary | ICD-10-CM

## 2023-02-10 MED ORDER — DENOSUMAB 60 MG/ML ~~LOC~~ SOSY
60.0000 mg | PREFILLED_SYRINGE | Freq: Once | SUBCUTANEOUS | Status: AC
Start: 2023-02-10 — End: 2023-02-10
  Administered 2023-02-10: 60 mg via SUBCUTANEOUS

## 2023-02-10 NOTE — Progress Notes (Signed)
Per orders of Dr. Eustaquio Boyden who is out of office and Dr Crawford Givens who is in office, injection of Prolia 60 mg given by Lewanda Rife in left arm.Patient tolerated injection well. Patient will make appointment for 6 month.

## 2023-03-07 ENCOUNTER — Encounter: Payer: Self-pay | Admitting: Family Medicine

## 2023-03-07 ENCOUNTER — Ambulatory Visit (INDEPENDENT_AMBULATORY_CARE_PROVIDER_SITE_OTHER): Payer: 59 | Admitting: Family Medicine

## 2023-03-07 VITALS — BP 120/64 | HR 72 | Temp 97.4°F | Ht 61.0 in | Wt 128.1 lb

## 2023-03-07 DIAGNOSIS — K8020 Calculus of gallbladder without cholecystitis without obstruction: Secondary | ICD-10-CM | POA: Diagnosis not present

## 2023-03-07 DIAGNOSIS — K760 Fatty (change of) liver, not elsewhere classified: Secondary | ICD-10-CM | POA: Diagnosis not present

## 2023-03-07 DIAGNOSIS — E1142 Type 2 diabetes mellitus with diabetic polyneuropathy: Secondary | ICD-10-CM | POA: Diagnosis not present

## 2023-03-07 DIAGNOSIS — M810 Age-related osteoporosis without current pathological fracture: Secondary | ICD-10-CM

## 2023-03-07 DIAGNOSIS — G3184 Mild cognitive impairment, so stated: Secondary | ICD-10-CM | POA: Diagnosis not present

## 2023-03-07 DIAGNOSIS — B179 Acute viral hepatitis, unspecified: Secondary | ICD-10-CM

## 2023-03-07 NOTE — Progress Notes (Unsigned)
Ph: [phone] Fax: [phone]   Patient ID: April Short, female    DOB: 1935-06-29, 88 y.o.   MRN: [id number]  This visit was conducted in person.  BP 120/64   Pulse 72   Temp (!) 97.4 F (36.3 C) (Temporal)   Ht 5\' 1"  (1.549 m)   Wt 128 lb 2 oz (58.1 kg)   SpO2 94%   BMI 24.21 kg/m    CC: 3 mo fu visit Subjective:   HPI: April Short is a 88 y.o. female presenting on 03/07/2023 for Medical Management of Chronic Issues (Here for 3 mo geriatric assessment. Pt accompanied by daughter, [name]. )   Received Prolia injection 02/10/2023. Recent episode of nausea/vomiting, found to have acute hepatitis with transaminitis (ALP 200s, AST 500s, ALT 1200) as well as acute renal insufficiency with Cr 1.35. Acute viral hepatitis panel negative. UCx grew Ecoli treated with keflex 1 wk course. Rpt LFTs improved with ALP 180, AST 53, ALT 332. T bili stayed normal throughout this.   Abd [place] 01/2023 - hepatic steatosis, gallstones measuring up to 0.6cm, medicorenal disease  Has been noticing hand neuropathy - daughter mentions this is chronic.   H/o cervical cancer, colon cancer.   Recent memory testing returned concerning. Here for further evaluation.     06/16/2022    9:27 AM  6CIT Screen  What Year? 0 points  What month? 3 points  What time? 0 points  Count back from 20 0 points  Months in reverse 4 points  Repeat phrase 10 points  Total Score 17 points   Geriatric Assessment: Activities of Daily Living:     Bathing- independent    Dressing-  independent    Eating- independent    Toileting- independent    Transferring- independent    Continence- independent Overall Assessment: independent  Instrumental Activities of Daily Living:     Transportation- dependent    Meal/Food Preparation- partially dependent    Shopping Errands- dependent    Housekeeping/Chores- dependent     Money Management/Finances- dependent    Medication Management- dependent    Ability to  Use Telephone- independent    Laundry- dependent Overall Assessment:  dependent  Mental Status Exam:     Clock Drawing Score:            Relevant past medical, surgical, family and social history reviewed and updated as indicated. Interim medical history since our last visit reviewed. Allergies and medications reviewed and updated. Outpatient Medications Prior to Visit  Medication Sig Dispense Refill   albuterol (PROVENTIL) (2.5 MG/3ML) 0.083% nebulizer solution Take 3 mLs (2.5 mg total) by nebulization 3 (three) times daily as needed for wheezing or shortness of breath. 75 mL 1   Alpha-Lipoic Acid 600 MG CAPS Take 1 capsule (600 mg total) by mouth daily.     aspirin EC 81 MG tablet Take 81 mg by mouth daily. Swallow whole.     atorvastatin (LIPITOR) 40 MG tablet TAKE 1 TABLET BY MOUTH EVERY DAY 90 tablet 2   BRILINTA 60 MG TABS tablet TAKE 1 TABLET BY MOUTH TWICE A DAY 60 tablet 8   carvedilol (COREG) 25 MG tablet TAKE 1 TABLET (25 MG TOTAL) BY MOUTH TWICE A DAY WITH MEALS 180 tablet 3   Cholecalciferol (VITAMIN D3) 25 MCG (1000 UT) CAPS Take 1 capsule (1,000 Units total) by mouth daily. 30 capsule    ENTRESTO 49-51 MG TAKE 1 TABLET BY MOUTH TWICE A DAY 60 tablet 12  FARXIGA 5 MG TABS tablet TAKE 1 TABLET (5 MG TOTAL) BY MOUTH DAILY. 30 tablet 6   PROLIA 60 MG/ML SOSY injection Inject 60 mg into the skin every 6 (six) months.     sertraline (ZOLOFT) 25 MG tablet TAKE 1 TABLET (25 MG TOTAL) BY MOUTH DAILY. 90 tablet 3   No facility-administered medications prior to visit.     Per HPI unless specifically indicated in ROS section below Review of Systems  Objective:  BP 120/64   Pulse 72   Temp (!) 97.4 F (36.3 C) (Temporal)   Ht 5\' 1"  (1.549 m)   Wt 128 lb 2 oz (58.1 kg)   SpO2 94%   BMI 24.21 kg/m   Wt Readings from Last 3 Encounters:  03/07/23 128 lb 2 oz (58.1 kg)  12/03/22 128 lb 8 oz (58.3 kg)  09/24/22 127 lb (57.6 kg)      Physical Exam Vitals and nursing note  reviewed.  Constitutional:      Appearance: Normal appearance. She is not ill-appearing.  HENT:     Mouth/Throat:     Comments: Wearing mask Musculoskeletal:     Right lower leg: No edema.     Left lower leg: No edema.  Skin:    General: Skin is warm and dry.     Findings: No rash.  Neurological:     Mental Status: She is alert.  Psychiatric:        Mood and Affect: Mood normal.        Behavior: Behavior normal.       Results for orders placed or performed in visit on 01/28/23  Basic Metabolic Panel  Result Value Ref Range   Sodium 137 135 - 145 mEq/L   Potassium 4.8 3.5 - 5.1 mEq/L   Chloride 105 96 - 112 mEq/L   CO2 24 19 - 32 mEq/L   Glucose, Bld 201 (H) 70 - 99 mg/dL   BUN 26 (H) 6 - 23 mg/dL   Creatinine, Ser 6.04 0.40 - 1.20 mg/dL   GFR 54.09 (L) >81.19 mL/min   Calcium 9.5 8.4 - 10.5 mg/dL    Assessment & Plan:   Problem List Items Addressed This Visit   None    No orders of the defined types were placed in this encounter.   No orders of the defined types were placed in this encounter.   There are no Patient Instructions on file for this visit.  Follow up plan: No follow-ups on file.  Eustaquio Boyden, MD

## 2023-03-07 NOTE — Patient Instructions (Addendum)
Si tiene higado grasoso y calculos en la vesicula biliar.  Dejenos saber si desarolla dolor abdominal especialmente relacionado con comida grasosa.  Gusto verla hoy.  Testing de [employer] deficiencias.  Regresar en 3 meses para seguimiento  4 modificaciones bsicas del estilo de vida para apoyar una mente sana:  1. Dieta nutritiva y balanceada.  2. Rutina regular de [place] fsica.  3. Actividad mental regular, como leer libros, hacer crucigramas, resolver problemas matemticos y [employer], industrial/product.  4. Actividades sociales con familia y [place], conversar y [place].  Tambin asegrese de control de la presin arterial, limite el consumo de alcohol y no fume.

## 2023-03-08 ENCOUNTER — Encounter: Payer: Self-pay | Admitting: Family Medicine

## 2023-03-08 DIAGNOSIS — K8020 Calculus of gallbladder without cholecystitis without obstruction: Secondary | ICD-10-CM | POA: Insufficient documentation

## 2023-03-08 DIAGNOSIS — K760 Fatty (change of) liver, not elsewhere classified: Secondary | ICD-10-CM | POA: Insufficient documentation

## 2023-03-08 NOTE — Assessment & Plan Note (Signed)
Reviewed signs of biliary colic, recent episode not consistent with this.  They will let me know if desire gen surgery evaluation.

## 2023-03-08 NOTE — Assessment & Plan Note (Addendum)
Acute hepatitis 11/2022 of unknown cause, symptoms largely resolved.  High R value of 12 suggests hepatocellular pattern. Lipase at the time was normal.  Will continue to monitor. Recheck labs next visit.  Abd US showed hepatic steatosis and gallstones as well as medicorenal disease.

## 2023-03-08 NOTE — Assessment & Plan Note (Signed)
She continues farxiga 5mg  daily - consider increased dose. Metformin intolerance (GI upset)

## 2023-03-08 NOTE — Assessment & Plan Note (Signed)
Discussed MMSE results suspicious for at least moderate degree of cognitive impairment, however she is still functioning well with family's assistance so anticipate MCI. She doesn't drive or cook ,she is dependent in IADLs. Reviewed 4 lifestyle measures to support a healthy mind as per instructions. Reassess at 3 mo f/u visit, consider aricept trial.

## 2023-03-08 NOTE — Assessment & Plan Note (Signed)
Latest prolia injection 02/10/2023

## 2023-03-08 NOTE — Assessment & Plan Note (Signed)
Noted on recent abd [place].

## 2023-03-28 ENCOUNTER — Ambulatory Visit (INDEPENDENT_AMBULATORY_CARE_PROVIDER_SITE_OTHER): Payer: 59

## 2023-03-28 DIAGNOSIS — I255 Ischemic cardiomyopathy: Secondary | ICD-10-CM | POA: Diagnosis not present

## 2023-03-28 LAB — CUP PACEART REMOTE DEVICE CHECK
Battery Remaining Longevity: 34 mo
Battery Remaining Percentage: 38 %
Battery Voltage: 2.93 V
Brady Statistic AP VP Percent: 12 %
Brady Statistic AP VS Percent: 1 %
Brady Statistic AS VP Percent: 83 %
Brady Statistic AS VS Percent: 4 %
Brady Statistic RA Percent Paced: 12 %
Date Time Interrogation Session: 20240708040016
HighPow Impedance: 78 Ohm
HighPow Impedance: 78 Ohm
Implantable Lead Connection Status: 753985
Implantable Lead Connection Status: 753985
Implantable Lead Connection Status: 753985
Implantable Lead Implant Date: 20180906
Implantable Lead Implant Date: 20180906
Implantable Lead Implant Date: 20180906
Implantable Lead Location: 753858
Implantable Lead Location: 753859
Implantable Lead Location: 753860
Implantable Pulse Generator Implant Date: 20180906
Lead Channel Impedance Value: 1650 Ohm
Lead Channel Impedance Value: 460 Ohm
Lead Channel Impedance Value: 600 Ohm
Lead Channel Pacing Threshold Amplitude: 0.75 V
Lead Channel Pacing Threshold Amplitude: 0.75 V
Lead Channel Pacing Threshold Amplitude: 1.25 V
Lead Channel Pacing Threshold Pulse Width: 0.5 ms
Lead Channel Pacing Threshold Pulse Width: 0.5 ms
Lead Channel Pacing Threshold Pulse Width: 0.5 ms
Lead Channel Sensing Intrinsic Amplitude: 11.9 mV
Lead Channel Sensing Intrinsic Amplitude: 2.4 mV
Lead Channel Setting Pacing Amplitude: 2 V
Lead Channel Setting Pacing Amplitude: 2.5 V
Lead Channel Setting Pacing Amplitude: 2.5 V
Lead Channel Setting Pacing Pulse Width: 0.5 ms
Lead Channel Setting Pacing Pulse Width: 0.5 ms
Lead Channel Setting Sensing Sensitivity: 0.5 mV
Pulse Gen Serial Number: 9776461
Zone Setting Status: 755011

## 2023-04-04 ENCOUNTER — Ambulatory Visit: Payer: 59 | Admitting: Podiatry

## 2023-04-04 DIAGNOSIS — Z91199 Patient's noncompliance with other medical treatment and regimen due to unspecified reason: Secondary | ICD-10-CM

## 2023-04-04 NOTE — Progress Notes (Signed)
1. No-show for appointment     

## 2023-04-13 NOTE — Progress Notes (Signed)
Remote ICD transmission.   

## 2023-06-07 ENCOUNTER — Encounter: Payer: Self-pay | Admitting: Family Medicine

## 2023-06-07 ENCOUNTER — Ambulatory Visit (INDEPENDENT_AMBULATORY_CARE_PROVIDER_SITE_OTHER): Payer: 59 | Admitting: Family Medicine

## 2023-06-07 VITALS — BP 110/54 | HR 65 | Temp 98.6°F | Ht 61.0 in | Wt 123.0 lb

## 2023-06-07 DIAGNOSIS — K760 Fatty (change of) liver, not elsewhere classified: Secondary | ICD-10-CM | POA: Diagnosis not present

## 2023-06-07 DIAGNOSIS — Z8619 Personal history of other infectious and parasitic diseases: Secondary | ICD-10-CM

## 2023-06-07 DIAGNOSIS — E1122 Type 2 diabetes mellitus with diabetic chronic kidney disease: Secondary | ICD-10-CM

## 2023-06-07 DIAGNOSIS — G3184 Mild cognitive impairment, so stated: Secondary | ICD-10-CM

## 2023-06-07 DIAGNOSIS — F4321 Adjustment disorder with depressed mood: Secondary | ICD-10-CM

## 2023-06-07 DIAGNOSIS — Z23 Encounter for immunization: Secondary | ICD-10-CM

## 2023-06-07 DIAGNOSIS — K8020 Calculus of gallbladder without cholecystitis without obstruction: Secondary | ICD-10-CM | POA: Diagnosis not present

## 2023-06-07 DIAGNOSIS — Z7984 Long term (current) use of oral hypoglycemic drugs: Secondary | ICD-10-CM | POA: Diagnosis not present

## 2023-06-07 DIAGNOSIS — E1142 Type 2 diabetes mellitus with diabetic polyneuropathy: Secondary | ICD-10-CM

## 2023-06-07 DIAGNOSIS — N183 Chronic kidney disease, stage 3 unspecified: Secondary | ICD-10-CM | POA: Diagnosis not present

## 2023-06-07 LAB — COMPREHENSIVE METABOLIC PANEL
ALT: 598 U/L — ABNORMAL HIGH (ref 0–35)
AST: 738 U/L — ABNORMAL HIGH (ref 0–37)
Albumin: 3.9 g/dL (ref 3.5–5.2)
Alkaline Phosphatase: 282 U/L — ABNORMAL HIGH (ref 39–117)
BUN: 28 mg/dL — ABNORMAL HIGH (ref 6–23)
CO2: 26 meq/L (ref 19–32)
Calcium: 9.5 mg/dL (ref 8.4–10.5)
Chloride: 101 meq/L (ref 96–112)
Creatinine, Ser: 1.18 mg/dL (ref 0.40–1.20)
GFR: 41.79 mL/min — ABNORMAL LOW (ref >60.00)
Glucose, Bld: 280 mg/dL — ABNORMAL HIGH (ref 70–99)
Potassium: 4.7 meq/L (ref 3.5–5.1)
Sodium: 134 meq/L — ABNORMAL LOW (ref 135–145)
Total Bilirubin: 0.6 mg/dL (ref 0.2–1.2)
Total Protein: 7.8 g/dL (ref 6.0–8.3)

## 2023-06-07 LAB — CBC WITH DIFFERENTIAL/PLATELET
Basophils Absolute: 0 10*3/uL (ref 0.0–0.1)
Basophils Relative: 0.8 % (ref 0.0–3.0)
Eosinophils Absolute: 0.1 10*3/uL (ref 0.0–0.7)
Eosinophils Relative: 2.8 % (ref 0.0–5.0)
HCT: 37.3 % (ref 36.0–46.0)
Hemoglobin: 12.2 g/dL (ref 12.0–15.0)
Lymphocytes Relative: 20.1 % (ref 12.0–46.0)
Lymphs Abs: 0.9 10*3/uL (ref 0.7–4.0)
MCHC: 32.7 g/dL (ref 30.0–36.0)
MCV: 89 fl (ref 78.0–100.0)
Monocytes Absolute: 0.4 10*3/uL (ref 0.1–1.0)
Monocytes Relative: 7.8 % (ref 3.0–12.0)
Neutro Abs: 3.2 10*3/uL (ref 1.4–7.7)
Neutrophils Relative %: 68.5 % (ref 43.0–77.0)
Platelets: 175 10*3/uL (ref 150.0–400.0)
RBC: 4.19 Mil/uL (ref 3.87–5.11)
RDW: 14.5 % (ref 11.5–15.5)
WBC: 4.7 10*3/uL (ref 4.0–10.5)

## 2023-06-07 LAB — HEMOGLOBIN A1C: Hgb A1c MFr Bld: 9.3 % — ABNORMAL HIGH (ref 4.6–6.5)

## 2023-06-07 NOTE — Progress Notes (Signed)
Ph: [phone] Fax: [phone]   Patient ID: April Short, female    DOB: Jun 08, 1935, 88 y.o.   MRN: [id number]  This visit was conducted in person.  BP (!) 110/54   Pulse 65   Temp 98.6 F (37 C) (Temporal)   Ht 5\' 1"  (1.549 m)   Wt 123 lb (55.8 kg)   SpO2 98%   BMI 23.24 kg/m    CC: 3 mo f/u visit  Subjective:   HPI: April Short is a 88 y.o. female presenting on 06/07/2023 for Medical Management of Chronic Issues   OP - latest prolia 02/10/2023.   Acute hepatitis 11/2022 - due for rpt LFTs. Abd US showing fatty liver with gallstones, medico-renal disease. Declines surgical intervention.   Memory difficulties - last visit MMSE 18/29, CDT 1/4. Diagnosed with MCI with memory loss as she continues functioning well with family support.   DM - continues farxiga 5mg  daily. No UTI symptoms or yeast infection symptoms. Continues with low appetite. However she has some urinary incontinence. Denies dysuria, urgency, hematuria, flank pain or lower abd pain or vaginal/vulvar rash.      Relevant past medical, surgical, family and social history reviewed and updated as indicated. Interim medical history since our last visit reviewed. Allergies and medications reviewed and updated. Outpatient Medications Prior to Visit  Medication Sig Dispense Refill   albuterol (PROVENTIL) (2.5 MG/3ML) 0.083% nebulizer solution Take 3 mLs (2.5 mg total) by nebulization 3 (three) times daily as needed for wheezing or shortness of breath. 75 mL 1   Alpha-Lipoic Acid 600 MG CAPS Take 1 capsule (600 mg total) by mouth daily.     aspirin EC 81 MG tablet Take 81 mg by mouth daily. Swallow whole.     atorvastatin (LIPITOR) 40 MG tablet TAKE 1 TABLET BY MOUTH EVERY DAY 90 tablet 2   BRILINTA 60 MG TABS tablet TAKE 1 TABLET BY MOUTH TWICE A DAY 60 tablet 8   carvedilol (COREG) 25 MG tablet TAKE 1 TABLET (25 MG TOTAL) BY MOUTH TWICE A DAY WITH MEALS 180 tablet 3   Cholecalciferol (VITAMIN D3) 25 MCG  (1000 UT) CAPS Take 1 capsule (1,000 Units total) by mouth daily. 30 capsule    ENTRESTO 49-51 MG TAKE 1 TABLET BY MOUTH TWICE A DAY 60 tablet 12   FARXIGA 5 MG TABS tablet TAKE 1 TABLET (5 MG TOTAL) BY MOUTH DAILY. 30 tablet 6   PROLIA 60 MG/ML SOSY injection Inject 60 mg into the skin every 6 (six) months.     sertraline (ZOLOFT) 25 MG tablet TAKE 1 TABLET (25 MG TOTAL) BY MOUTH DAILY. 90 tablet 3   No facility-administered medications prior to visit.     Per HPI unless specifically indicated in ROS section below Review of Systems  Objective:  BP (!) 110/54   Pulse 65   Temp 98.6 F (37 C) (Temporal)   Ht 5\' 1"  (1.549 m)   Wt 123 lb (55.8 kg)   SpO2 98%   BMI 23.24 kg/m   Wt Readings from Last 3 Encounters:  06/07/23 123 lb (55.8 kg)  03/07/23 128 lb 2 oz (58.1 kg)  12/03/22 128 lb 8 oz (58.3 kg)      Physical Exam Vitals and nursing note reviewed.  Constitutional:      Appearance: Normal appearance. She is not ill-appearing.  HENT:     Nose:     Comments: Wearing mask    Mouth/Throat:     Mouth:  Mucous membranes are moist.     Pharynx: Oropharynx is clear. No oropharyngeal exudate or posterior oropharyngeal erythema.  Eyes:     Extraocular Movements: Extraocular movements intact.     Pupils: Pupils are equal, round, and reactive to light.  Cardiovascular:     Rate and Rhythm: Normal rate and regular rhythm.     Pulses: Normal pulses.     Heart sounds: Normal heart sounds. No murmur heard. Pulmonary:     Effort: Pulmonary effort is normal. No respiratory distress.     Breath sounds: Normal breath sounds. No wheezing, rhonchi or rales.  Musculoskeletal:     Right lower leg: No edema.     Left lower leg: No edema.  Skin:    General: Skin is warm and dry.     Findings: No rash.  Neurological:     Mental Status: She is alert.  Psychiatric:        Mood and Affect: Mood normal.        Behavior: Behavior normal.       Lab Results  Component Value Date   NA  137 01/28/2023   CL 105 01/28/2023   K 4.8 01/28/2023   CO2 24 01/28/2023   BUN 26 (H) 01/28/2023   CREATININE 1.16 01/28/2023   GFR 42.76 (L) 01/28/2023   CALCIUM 9.5 01/28/2023   PHOS 3.8 07/02/2022   ALBUMIN 3.9 12/06/2022   GLUCOSE 201 (H) 01/28/2023   Lab Results  Component Value Date   ALT 322 (H) 12/06/2022   AST 53 (H) 12/06/2022   ALKPHOS 180 (H) 12/06/2022   BILITOT 0.7 12/06/2022   Assessment & Plan:   Problem List Items Addressed This Visit     Type 2 diabetes mellitus with peripheral neuropathy (HCC)    Chronic, update A1c only on SGLT2i farxiga 5mg . If A1c consistently >8%, will increase farxiga to full 10mg  dose.  Metformin intolerance.       Relevant Orders   Hemoglobin A1c   Adjustment disorder with depressed mood    Stable period on sertraline 25mg  daily - continue this.      MCI (mild cognitive impairment) with memory loss    Overall stable period.  In cardiac history, will not start aricept at this time.       CKD stage 3 due to type 2 diabetes mellitus (HCC)    Update kidney function today.       History of acute hepatitis    Episode of acute hepatitis 11/2022 with R factor (12) suggestive of hepatocellular pattern, not cholestatic, lipase was normal at the time as was acute viral hepatitis panel. iFOB negative as well (in personal history of colon cancer). Symptoms have resolved since. Update labs today. Discussed GI eval - see below.       Relevant Orders   Ambulatory referral to Gastroenterology   Gallstones - Primary    Noted on abdominal ultrasound.  During prior episode of acute hepatitis, symptoms were not consistent with biliary colic however gallstone presence suggests risk of gallstone complications. She and family would not want to undergo surgical intervention ie cholecystectomy.  They are interested in eval by GI to discuss other preventative measures, ?gallbladder dissolution option. Referral placed.       Relevant Orders    Comprehensive metabolic panel   CBC with Differential/Platelet   Ambulatory referral to Gastroenterology   NAFLD (nonalcoholic fatty liver disease)    By imaging study      Relevant Orders   Ambulatory  referral to Gastroenterology   Other Visit Diagnoses     Encounter for immunization       Relevant Orders   Flu Vaccine Trivalent High Dose (Fluad) (Completed)        No orders of the defined types were placed in this encounter.   Orders Placed This Encounter  Procedures   Flu Vaccine Trivalent High Dose (Fluad)   Comprehensive metabolic panel   Hemoglobin A1c   CBC with Differential/Platelet   Ambulatory referral to Gastroenterology    Referral Priority:   Routine    Referral Type:   Consultation    Referral Reason:   Specialty Services Required    Number of Visits Requested:   1    Patient Instructions  Flu shot today Labs today  Los remitiremos a Social research officer, government para discutir sobre calculos en la vesicula biliar y episodio reciente de inflamacion del higado.  Siga medicamentos como [employer].  Regresar en 3 meses para fisico   Follow up plan: Return in about 3 months (around 09/06/2023) for annual exam, prior fasting for blood work, medicare wellness visit.  Eustaquio Boyden, MD

## 2023-06-07 NOTE — Assessment & Plan Note (Signed)
By imaging study

## 2023-06-07 NOTE — Assessment & Plan Note (Addendum)
Episode of acute hepatitis 11/2022 with R factor (12) suggestive of hepatocellular pattern, not cholestatic, lipase was normal at the time as was acute viral hepatitis panel. iFOB negative as well (in personal history of colon cancer). Symptoms have resolved since. Update labs today. Discussed GI eval - see below.

## 2023-06-07 NOTE — Assessment & Plan Note (Signed)
Chronic, update A1c only on SGLT2i farxiga 5mg . If A1c consistently >8%, will increase farxiga to full 10mg  dose.  Metformin intolerance.

## 2023-06-07 NOTE — Patient Instructions (Addendum)
Flu shot today Labs today  Los remitiremos a Social research officer, government para discutir sobre calculos en la vesicula biliar y episodio reciente de inflamacion del higado.  Siga medicamentos como [employer].  Regresar en 3 meses para fisico

## 2023-06-07 NOTE — Assessment & Plan Note (Signed)
Noted on abdominal ultrasound.  During prior episode of acute hepatitis, symptoms were not consistent with biliary colic however gallstone presence suggests risk of gallstone complications. She and family would not want to undergo surgical intervention ie cholecystectomy.  They are interested in eval by GI to discuss other preventative measures, ?gallbladder dissolution option. Referral placed.

## 2023-06-07 NOTE — Assessment & Plan Note (Addendum)
Overall stable period.  In cardiac history, will not start aricept at this time.

## 2023-06-07 NOTE — Assessment & Plan Note (Signed)
Update kidney function today.

## 2023-06-07 NOTE — Assessment & Plan Note (Signed)
Stable period on sertraline 25mg  daily - continue this.

## 2023-06-10 ENCOUNTER — Other Ambulatory Visit: Payer: Self-pay | Admitting: Family Medicine

## 2023-06-10 DIAGNOSIS — K8020 Calculus of gallbladder without cholecystitis without obstruction: Secondary | ICD-10-CM

## 2023-06-10 DIAGNOSIS — R7401 Elevation of levels of liver transaminase levels: Secondary | ICD-10-CM

## 2023-06-10 DIAGNOSIS — K760 Fatty (change of) liver, not elsewhere classified: Secondary | ICD-10-CM

## 2023-06-10 DIAGNOSIS — Z8619 Personal history of other infectious and parasitic diseases: Secondary | ICD-10-CM

## 2023-06-10 DIAGNOSIS — Z85038 Personal history of other malignant neoplasm of large intestine: Secondary | ICD-10-CM

## 2023-06-19 NOTE — Patient Instructions (Incomplete)
April Short , Thank you for taking time to come for your Medicare Wellness Visit. I appreciate your ongoing commitment to your health goals. Please review the following plan we discussed and let me know if I can assist you in the future.   Referrals/Orders/Follow-Ups/Clinician Recommendations: Aim for 30 minutes of exercise or brisk walking, 6-8 glasses of water, and 5 servings of fruits and vegetables each day.  This is a list of the screening recommended for you and due dates:  Health Maintenance  Topic Date Due   DTaP/Tdap/Td vaccine (1 - Tdap) Never done   Zoster (Shingles) Vaccine (1 of 2) Never done   Pneumonia Vaccine (1 of 1 - PCV) 10/23/2001   Complete foot exam   10/01/2022   COVID-19 Vaccine (6 - 2023-24 season) 05/22/2023   Medicare Annual Wellness Visit  06/17/2023   Eye exam for diabetics  08/05/2023   Hemoglobin A1C  12/05/2023   Flu Shot  Completed   DEXA scan (bone density measurement)  Completed   HPV Vaccine  Aged Out    Advanced directives: (ACP Link)Information on Advanced Care Planning can be found at [facility] [place] Advance Health Care Directives Advance Health Care Directives ([email or web address]   Next Medicare Annual Wellness Visit scheduled for next year: Yes

## 2023-06-19 NOTE — Progress Notes (Unsigned)
Subjective:   April Short is a 88 y.o. female who presents for Medicare Annual (Subsequent) preventive examination.  Visit Complete: {VISITMETHOD:[phone]}  Patient Medicare AWV questionnaire was completed by the patient on ***; I have confirmed that all information answered by patient is correct and no changes since this date.        Objective:    There were no vitals filed for this visit. There is no height or weight on file to calculate BMI.     11/25/2022    1:59 PM 06/16/2022    9:23 AM  Advanced Directives  Does Patient Have a Medical Advance Directive? No No  Would patient like information on creating a medical advance directive? No - Patient declined No - Patient declined    Current Medications (verified) Outpatient Encounter Medications as of 06/20/2023  Medication Sig   albuterol (PROVENTIL) (2.5 MG/3ML) 0.083% nebulizer solution Take 3 mLs (2.5 mg total) by nebulization 3 (three) times daily as needed for wheezing or shortness of breath.   Alpha-Lipoic Acid 600 MG CAPS Take 1 capsule (600 mg total) by mouth daily.   aspirin EC 81 MG tablet Take 81 mg by mouth daily. Swallow whole.   atorvastatin (LIPITOR) 40 MG tablet TAKE 1 TABLET BY MOUTH EVERY DAY   BRILINTA 60 MG TABS tablet TAKE 1 TABLET BY MOUTH TWICE A DAY   carvedilol (COREG) 25 MG tablet TAKE 1 TABLET (25 MG TOTAL) BY MOUTH TWICE A DAY WITH MEALS   Cholecalciferol (VITAMIN D3) 25 MCG (1000 UT) CAPS Take 1 capsule (1,000 Units total) by mouth daily.   ENTRESTO 49-51 MG TAKE 1 TABLET BY MOUTH TWICE A DAY   FARXIGA 5 MG TABS tablet TAKE 1 TABLET (5 MG TOTAL) BY MOUTH DAILY.   PROLIA 60 MG/ML SOSY injection Inject 60 mg into the skin every 6 (six) months.   sertraline (ZOLOFT) 25 MG tablet TAKE 1 TABLET (25 MG TOTAL) BY MOUTH DAILY.   No facility-administered encounter medications on file as of 06/20/2023.    Allergies (verified) Patient has no known allergies.   History: Past Medical History:   Diagnosis Date   Anemia    Arthritis    CAD (coronary artery disease)    a. 10/2015 PCI [facility], [place], [place]): OM (2.5x30 Resolute DES), LCX (3.5x12 Resolute DES).   Cervical cancer (HCC) 2005   Chronic systolic CHF (congestive heart failure) (HCC)    a. 01/2016 Echo: EF 20-25%, diff HK, septal-lateral dyssynchrony. Mild MR, mod to sev dil LA, nl RV, mod dil RA, mod TR, PASP .   Colon cancer (HCC) 2001   DM (diabetes mellitus) (HCC)    History of chicken pox    HLD (hyperlipidemia)    Hypertensive heart disease    Ischemic cardiomyopathy    a. 01/2016 Echo: EF 20-25%.   LBBB (left bundle branch block)    Past Surgical History:  Procedure Laterality Date   COLON RESECTION  2001   colon cancer   COLONOSCOPY  11/2011   polyp x1, int hem ([place])   CORONARY STENT PLACEMENT     TOTAL ABDOMINAL HYSTERECTOMY  2005   cervical cancer   Family History  Problem Relation Age of Onset   Colon cancer Mother    Diabetes Sister    Stomach cancer Son    Breast cancer Neg Hx    Social History   Socioeconomic History   Marital status: Widowed    Spouse name: Not on file  Number of children: 7   Years of education: Not on file   Highest education level: Not on file  Occupational History   Not on file  Tobacco Use   Smoking status: Never   Smokeless tobacco: Never  Substance and Sexual Activity   Alcohol use: Not Currently    Comment: occasionally   Drug use: No   Sexual activity: Not on file  Other Topics Concern   Not on file  Social History Narrative   Widow 2018, lives with daughter   From [place], lived in [place] then [place].    Moved to [facility] 2021 to be closer to family    Occ: retired   Activity:    Diet:    Social Determinants of Corporate investment banker Strain: Low Risk  (06/16/2022)   Overall Financial Resource Strain (CARDIA)    Difficulty of Paying Living Expenses: Not hard at all  Food Insecurity: No Food Insecurity (06/16/2022)   Hunger Vital Sign     Worried About Running Out of Food in the Last Year: Never true    Ran Out of Food in the Last Year: Never true  Transportation Needs: No Transportation Needs (06/16/2022)   PRAPARE - Administrator, Civil Service (Medical): No    Lack of Transportation (Non-Medical): No  Physical Activity: Insufficiently Active (06/16/2022)   Exercise Vital Sign    Days of Exercise per Week: 3 days    Minutes of Exercise per Session: 30 min  Stress: No Stress Concern Present (06/16/2022)   [employer] of Occupational Health - Occupational Stress Questionnaire    Feeling of Stress : Not at all  Social Connections: Socially Isolated (06/16/2022)   Social Connection and Isolation Panel [NHANES]    Frequency of Communication with Friends and Family: More than three times a week    Frequency of Social Gatherings with Friends and Family: Three times a week    Attends Religious Services: Never    Active Member of Clubs or Organizations: No    Attends Banker Meetings: Never    Marital Status: Widowed    Tobacco Counseling Counseling given: Not Answered   Clinical Intake:                        Activities of Daily Living     No data to display          Patient Care Team: Eustaquio Boyden, MD as PCP - General (Family Medicine) Runell Gess, MD as PCP - Cardiology (Cardiology)  Indicate any recent Medical Services you may have received from other than [facility] providers in the past year (date may be approximate).     Assessment:   This is a routine wellness examination for April Short.  Hearing/Vision screen No results found.   Goals Addressed   None    Depression Screen    06/07/2023    9:53 AM 03/07/2023   11:07 AM 12/03/2022   10:12 AM 11/25/2022    1:59 PM 06/16/2022    9:17 AM 06/15/2021    5:29 PM 12/05/2020   11:16 AM  PHQ 2/9 Scores  PHQ - 2 Score 3 1 3  0 0 2 3  PHQ- 9 Score 14 6 11   0 6 11    Fall Risk    06/07/2023    9:54 AM  03/07/2023   11:07 AM 11/25/2022    1:59 PM 06/16/2022    9:24 AM 06/15/2021  4:04 PM  Fall Risk   Falls in the past year? 0 0 0 0 0  Number falls in past yr: 0   0   Injury with Fall? 0   0   Risk for fall due to : No Fall Risks   No Fall Risks   Follow up Falls evaluation completed   Falls prevention discussed;Falls evaluation completed     MEDICARE RISK AT HOME:    TIMED UP AND GO:  Was the test performed?  No    Cognitive Function:        06/16/2022    9:27 AM  6CIT Screen  What Year? 0 points  What month? 3 points  What time? 0 points  Count back from 20 0 points  Months in reverse 4 points  Repeat phrase 10 points  Total Score 17 points    Immunizations Immunization History  Administered Date(s) Administered   Fluad Quad(high Dose 65+) 07/11/2020, 06/15/2021, 07/02/2022   Fluad Trivalent(High Dose 65+) 06/07/2023   Influenza,inj,Quad PF,6+ Mos 10/21/2016   Moderna Covid-19 Vaccine Bivalent Booster 66yrs & up 11/02/2021   Moderna SARS-COV2 Booster Vaccination 08/28/2020   Moderna Sars-Covid-2 Vaccination 10/15/2019, 11/12/2019   PFIZER(Purple Top)SARS-COV-2 Vaccination 11/06/2021   Pneumococcal-Unspecified 09/20/2014    TDAP status: Due, Education has been provided regarding the importance of this vaccine. Advised may receive this vaccine at local pharmacy or Health Dept. Aware to provide a copy of the vaccination record if obtained from local pharmacy or Health Dept. Verbalized acceptance and understanding.  Flu Vaccine status: Up to date  Pneumococcal vaccine status: Due, Education has been provided regarding the importance of this vaccine. Advised may receive this vaccine at local pharmacy or Health Dept. Aware to provide a copy of the vaccination record if obtained from local pharmacy or Health Dept. Verbalized acceptance and understanding.  Covid-19 vaccine status: Information provided on how to obtain vaccines.   Qualifies for Shingles Vaccine? Yes    Zostavax completed No   Shingrix Completed?: No.    Education has been provided regarding the importance of this vaccine. Patient has been advised to call insurance company to determine out of pocket expense if they have not yet received this vaccine. Advised may also receive vaccine at local pharmacy or Health Dept. Verbalized acceptance and understanding.  Screening Tests Health Maintenance  Topic Date Due   DTaP/Tdap/Td (1 - Tdap) Never done   Zoster Vaccines- Shingrix (1 of 2) Never done   Pneumonia Vaccine 39+ Years old (1 of 1 - PCV) 10/23/2001   FOOT EXAM  10/01/2022   COVID-19 Vaccine (6 - 2023-24 season) 05/22/2023   Medicare Annual Wellness (AWV)  06/17/2023   OPHTHALMOLOGY EXAM  08/05/2023   HEMOGLOBIN A1C  12/05/2023   INFLUENZA VACCINE  Completed   DEXA SCAN  Completed   HPV VACCINES  Aged Out    Health Maintenance  Health Maintenance Due  Topic Date Due   DTaP/Tdap/Td (1 - Tdap) Never done   Zoster Vaccines- Shingrix (1 of 2) Never done   Pneumonia Vaccine 2+ Years old (1 of 1 - PCV) 10/23/2001   FOOT EXAM  10/01/2022   COVID-19 Vaccine (6 - 2023-24 season) 05/22/2023   Medicare Annual Wellness (AWV)  06/17/2023    Colorectal cancer screening: No longer required.   Mammogram status: No longer required due to age and preference .  Bone Density status: Completed 06/22/22. Results reflect: Bone density results: OSTEOPOROSIS. Repeat every 2 years.  Lung Cancer Screening: (Low Dose  CT Chest recommended if Age 28-80 years, 20 pack-year currently smoking OR have quit w/in 15years.) does not qualify.   Lung Cancer Screening Referral: n/a  Additional Screening:  Hepatitis C Screening: does not qualify  Vision Screening: Recommended annual ophthalmology exams for early detection of glaucoma and other disorders of the eye. Is the patient up to date with their annual eye exam?  No  Who is the provider or what is the name of the office in which the patient attends  annual eye exams? *** If pt is not established with a provider, would they like to be referred to a provider to establish care? {YES/NO:21197}.   Dental Screening: Recommended annual dental exams for proper oral hygiene  Diabetic Foot Exam: Diabetic Foot Exam: Overdue, Pt has been advised about the importance in completing this exam. Pt is scheduled for diabetic foot exam on at next office visit.  Community Resource Referral / Chronic Care Management: CRR required this visit?  {YES/NO:21197}  CCM required this visit?  {CCM Required [phone]}     Plan:     I have personally reviewed and noted the following in the patient's chart:   Medical and social history Use of alcohol, tobacco or illicit drugs  Current medications and supplements including opioid prescriptions. {Opioid Prescriptions:[phone]} Functional ability and status Nutritional status Physical activity Advanced directives List of other physicians Hospitalizations, surgeries, and ER visits in previous 12 months Vitals Screenings to include cognitive, depression, and falls Referrals and appointments  In addition, I have reviewed and discussed with patient certain preventive protocols, quality metrics, and best practice recommendations. A written personalized care plan for preventive services as well as general preventive health recommendations were provided to patient.     Kandis Fantasia [place], [place]   02/19/931   After Visit Summary: {CHL AMB AWV After Visit [phone]}  Nurse Notes: ***

## 2023-06-20 ENCOUNTER — Ambulatory Visit
Admission: RE | Admit: 2023-06-20 | Discharge: 2023-06-20 | Disposition: A | Discharge status: Home / Self Care | Payer: 59 | Source: Ambulatory Visit | Attending: Family Medicine | Admitting: Family Medicine

## 2023-06-20 ENCOUNTER — Other Ambulatory Visit: Payer: Self-pay | Admitting: Cardiovascular Disease

## 2023-06-20 ENCOUNTER — Ambulatory Visit (INDEPENDENT_AMBULATORY_CARE_PROVIDER_SITE_OTHER): Payer: 59

## 2023-06-20 VITALS — Ht 61.0 in | Wt 123.0 lb

## 2023-06-20 DIAGNOSIS — Z0000 Encounter for general adult medical examination without abnormal findings: Secondary | ICD-10-CM | POA: Diagnosis not present

## 2023-06-20 DIAGNOSIS — K760 Fatty (change of) liver, not elsewhere classified: Secondary | ICD-10-CM

## 2023-06-20 DIAGNOSIS — Z8619 Personal history of other infectious and parasitic diseases: Secondary | ICD-10-CM

## 2023-06-20 DIAGNOSIS — C189 Malignant neoplasm of colon, unspecified: Secondary | ICD-10-CM | POA: Diagnosis not present

## 2023-06-20 DIAGNOSIS — K8020 Calculus of gallbladder without cholecystitis without obstruction: Secondary | ICD-10-CM

## 2023-06-20 DIAGNOSIS — R7401 Elevation of levels of liver transaminase levels: Secondary | ICD-10-CM

## 2023-06-20 DIAGNOSIS — Z85038 Personal history of other malignant neoplasm of large intestine: Secondary | ICD-10-CM

## 2023-06-20 MED ORDER — IOPAMIDOL (ISOVUE-300) INJECTION 61%
75.0000 mL | Freq: Once | INTRAVENOUS | Status: AC | PRN
  Administered 2023-06-20: 75 mL via INTRAVENOUS

## 2023-06-23 ENCOUNTER — Encounter: Payer: Self-pay | Admitting: Internal Medicine

## 2023-06-27 ENCOUNTER — Ambulatory Visit (INDEPENDENT_AMBULATORY_CARE_PROVIDER_SITE_OTHER): Payer: 59

## 2023-06-27 DIAGNOSIS — I5022 Chronic systolic (congestive) heart failure: Secondary | ICD-10-CM

## 2023-06-27 DIAGNOSIS — I255 Ischemic cardiomyopathy: Secondary | ICD-10-CM | POA: Diagnosis not present

## 2023-06-27 LAB — CUP PACEART REMOTE DEVICE CHECK
Battery Remaining Longevity: 31 mo
Battery Remaining Percentage: 36 %
Battery Voltage: 2.92 V
Brady Statistic AP VP Percent: 13 %
Brady Statistic AP VS Percent: 1 %
Brady Statistic AS VP Percent: 83 %
Brady Statistic AS VS Percent: 3.9 %
Brady Statistic RA Percent Paced: 13 %
Date Time Interrogation Session: 20241007040016
HighPow Impedance: 83 Ohm
HighPow Impedance: 83 Ohm
Implantable Lead Connection Status: 753985
Implantable Lead Connection Status: 753985
Implantable Lead Connection Status: 753985
Implantable Lead Implant Date: 20180906
Implantable Lead Implant Date: 20180906
Implantable Lead Implant Date: 20180906
Implantable Lead Location: 753858
Implantable Lead Location: 753859
Implantable Lead Location: 753860
Implantable Pulse Generator Implant Date: 20180906
Lead Channel Impedance Value: 1675 Ohm
Lead Channel Impedance Value: 450 Ohm
Lead Channel Impedance Value: 580 Ohm
Lead Channel Pacing Threshold Amplitude: 0.75 V
Lead Channel Pacing Threshold Amplitude: 0.75 V
Lead Channel Pacing Threshold Amplitude: 1.25 V
Lead Channel Pacing Threshold Pulse Width: 0.5 ms
Lead Channel Pacing Threshold Pulse Width: 0.5 ms
Lead Channel Pacing Threshold Pulse Width: 0.5 ms
Lead Channel Sensing Intrinsic Amplitude: 1.1 mV
Lead Channel Sensing Intrinsic Amplitude: 11.9 mV
Lead Channel Setting Pacing Amplitude: 2 V
Lead Channel Setting Pacing Amplitude: 2.5 V
Lead Channel Setting Pacing Amplitude: 2.5 V
Lead Channel Setting Pacing Pulse Width: 0.5 ms
Lead Channel Setting Pacing Pulse Width: 0.5 ms
Lead Channel Setting Sensing Sensitivity: 0.5 mV
Pulse Gen Serial Number: 9776461
Zone Setting Status: 755011

## 2023-06-29 ENCOUNTER — Telehealth: Payer: Self-pay | Admitting: Family Medicine

## 2023-06-29 NOTE — Telephone Encounter (Signed)
Ok to do this. Will await FMLA forms.

## 2023-06-29 NOTE — Telephone Encounter (Signed)
Patient's daughter, April Short called to ask if Dr. Reece Agar could fill out FMLA for her. States that her sister usually cares for their mother but she has to leave for a family emergency so April Short will be taking care of the patient for a while. April Short is needing FMLA filled out for her employer to excuse her from work on account of caring for her mother while her sister is out of town. Advised that she would have this faxed over to [place], I let April Short now I would send a message to Dr. Reece Agar

## 2023-07-01 NOTE — Telephone Encounter (Signed)
Filled and in Lisa's box.  Daughter Eugenia Pancoast planning to care for her mother 07/18/2023 - 10/07/2023 While other daughter who is regular caregiver takes a vacation.

## 2023-07-01 NOTE — Telephone Encounter (Signed)
Form received placed in box for review.

## 2023-07-04 ENCOUNTER — Ambulatory Visit: Payer: 59 | Attending: Cardiovascular Disease | Admitting: Cardiovascular Disease

## 2023-07-04 ENCOUNTER — Encounter: Payer: Self-pay | Admitting: Cardiovascular Disease

## 2023-07-04 VITALS — BP 110/52 | HR 72 | Ht 60.0 in | Wt 124.4 lb

## 2023-07-04 DIAGNOSIS — Z9861 Coronary angioplasty status: Secondary | ICD-10-CM | POA: Diagnosis not present

## 2023-07-04 DIAGNOSIS — E1169 Type 2 diabetes mellitus with other specified complication: Secondary | ICD-10-CM | POA: Diagnosis not present

## 2023-07-04 DIAGNOSIS — E785 Hyperlipidemia, unspecified: Secondary | ICD-10-CM | POA: Diagnosis not present

## 2023-07-04 DIAGNOSIS — I255 Ischemic cardiomyopathy: Secondary | ICD-10-CM

## 2023-07-04 DIAGNOSIS — I447 Left bundle-branch block, unspecified: Secondary | ICD-10-CM | POA: Diagnosis not present

## 2023-07-04 DIAGNOSIS — I5022 Chronic systolic (congestive) heart failure: Secondary | ICD-10-CM | POA: Diagnosis not present

## 2023-07-04 DIAGNOSIS — I5020 Unspecified systolic (congestive) heart failure: Secondary | ICD-10-CM

## 2023-07-04 DIAGNOSIS — Z95810 Presence of automatic (implantable) cardiac defibrillator: Secondary | ICD-10-CM | POA: Diagnosis not present

## 2023-07-04 DIAGNOSIS — I110 Hypertensive heart disease with heart failure: Secondary | ICD-10-CM

## 2023-07-04 DIAGNOSIS — I2510 Atherosclerotic heart disease of native coronary artery without angina pectoris: Secondary | ICD-10-CM

## 2023-07-04 NOTE — Patient Instructions (Signed)

## 2023-07-04 NOTE — Assessment & Plan Note (Signed)
History of CAD status post circumflex and obtuse marginal branch stenting at [facility] in [place] February 2017 with resolute DES stents.  She has been on aspirin and Brilinta up until recently.  I think it is safe for her to stop her Brilinta.  She denies chest pain.

## 2023-07-04 NOTE — Telephone Encounter (Signed)
Spoke with pt's daughter, [name] (on dpr), notifying her Judy's form is ready to pick up. Verbalizes understanding and will pick up today.  [Placed FMLA form at front office.]

## 2023-07-04 NOTE — Assessment & Plan Note (Signed)
History of essential hypertension blood pressure measured today at 110/52.  She is on carvedilol and Entresto.

## 2023-07-04 NOTE — Assessment & Plan Note (Signed)
History of hyperlipidemia on atorvastatin with lipid profile performed 08/09/2022 revealing total cholesterol 160, LDL 71 and HDL 58.

## 2023-07-04 NOTE — Assessment & Plan Note (Signed)
Chronic left bundle branch block

## 2023-07-04 NOTE — Assessment & Plan Note (Signed)
History of ischemic cardiomyopathy with an EF in the past in the 25% range.  She is on GDMT with recent echo performed 09/06/2022 revealing EF of 50 to 55% without valvular abnormalities.  She is completely asymptomatic.

## 2023-07-04 NOTE — Progress Notes (Signed)
07/04/2023 April Short   July 01, 1935  [id number]  Primary Physician Eustaquio Boyden, MD Primary Cardiologist: Runell Gess MD Nicholes [place], [place]  HPI:  April Short is a 88 y.o.  widowed Latino female accompanied by her daughters April Short today she currently lives with. I last saw her in the office 07/28/2022. She was referred by April Short for cardiac evaluation to be established in our practice. She recently relocated from [place] to [place] to be closer to family. She does have a history of diabetes. She had 2 drug-eluting stents placed in her circumflex and obtuse marginal branch and [place] 10/31/15 with a 2.5 x 30 mm long resolute stent placement in obtuse marginal branch and a 3.5 mm x 12 mm long placed in the circumflex. She is on appropriate medicines for systolic heart failure. She recently lost a significant amount of weight secondary to diuresis. She isambulatory but really does not leave the house. She denies chest pain. She does have some dyspnea on exertion. She was on dual therapy with Brilenta, although the Brilinta has been discontinued.Marland Kitchen  She was transitioned to [place].  She had an ICD implanted in [place] in 2018.   Since I saw her in the office 1 year ago, she continues to do well.  She denies chest pain or shortness of breath.  She has had no ICD discharges, and is followed by Dr. Ladona Short for this.   Current Meds  Medication Sig   albuterol (PROVENTIL) (2.5 MG/3ML) 0.083% nebulizer solution Take 3 mLs (2.5 mg total) by nebulization 3 (three) times daily as needed for wheezing or shortness of breath.   Alpha-Lipoic Acid 600 MG CAPS Take 1 capsule (600 mg total) by mouth daily.   aspirin EC 81 MG tablet Take 81 mg by mouth daily. Swallow whole.   atorvastatin (LIPITOR) 40 MG tablet TAKE 1 TABLET BY MOUTH EVERY DAY   carvedilol (COREG) 25 MG tablet TAKE 1 TABLET (25 MG TOTAL) BY MOUTH TWICE A DAY WITH MEALS   Cholecalciferol (VITAMIN D3)  25 MCG (1000 UT) CAPS Take 1 capsule (1,000 Units total) by mouth daily.   ENTRESTO 49-51 MG TAKE 1 TABLET BY MOUTH TWICE A DAY   FARXIGA 5 MG TABS tablet TAKE 1 TABLET (5 MG TOTAL) BY MOUTH DAILY.   PROLIA 60 MG/ML SOSY injection Inject 60 mg into the skin every 6 (six) months.   sertraline (ZOLOFT) 25 MG tablet TAKE 1 TABLET (25 MG TOTAL) BY MOUTH DAILY.     No Known Allergies  Social History   Socioeconomic History   Marital status: Widowed    Spouse name: Not on file   Number of children: 7   Years of education: Not on file   Highest education level: Not on file  Occupational History   Not on file  Tobacco Use   Smoking status: Never   Smokeless tobacco: Never  Substance and Sexual Activity   Alcohol use: Not Currently    Comment: occasionally   Drug use: No   Sexual activity: Not on file  Other Topics Concern   Not on file  Social History Narrative   Widow 2018, lives with daughter   From [place], lived in [place] then [place].    Moved to [facility] 2021 to be closer to family    Occ: retired   Activity:    Diet:    Social Determinants of Health   Financial Resource Strain: Low Risk  (06/20/2023)  Overall Financial Resource Strain (CARDIA)    Difficulty of Paying Living Expenses: Not hard at all  Food Insecurity: No Food Insecurity (06/20/2023)   Hunger Vital Sign    Worried About Running Out of Food in the Last Year: Never true    Ran Out of Food in the Last Year: Never true  Transportation Needs: No Transportation Needs (06/20/2023)   PRAPARE - Administrator, Civil Service (Medical): No    Lack of Transportation (Non-Medical): No  Physical Activity: Inactive (06/20/2023)   Exercise Vital Sign    Days of Exercise per Week: 0 days    Minutes of Exercise per Session: 0 min  Stress: No Stress Concern Present (06/20/2023)   [employer] of Occupational Health - Occupational Stress Questionnaire    Feeling of Stress : Not at all  Social Connections: Socially  Isolated (06/20/2023)   Social Connection and Isolation Panel [NHANES]    Frequency of Communication with Friends and Family: More than three times a week    Frequency of Social Gatherings with Friends and Family: Three times a week    Attends Religious Services: Never    Active Member of Clubs or Organizations: No    Attends Banker Meetings: Never    Marital Status: Widowed  Intimate Partner Violence: Not At Risk (06/20/2023)   Humiliation, Afraid, Rape, and Kick questionnaire    Fear of Current or Ex-Partner: No    Emotionally Abused: No    Physically Abused: No    Sexually Abused: No     Review of Systems: General: negative for chills, fever, night sweats or weight changes.  Cardiovascular: negative for chest pain, dyspnea on exertion, edema, orthopnea, palpitations, paroxysmal nocturnal dyspnea or shortness of breath Dermatological: negative for rash Respiratory: negative for cough or wheezing Urologic: negative for hematuria Abdominal: negative for nausea, vomiting, diarrhea, bright red blood per rectum, melena, or hematemesis Neurologic: negative for visual changes, syncope, or dizziness All other systems reviewed and are otherwise negative except as noted above.    Blood pressure (!) 110/52, pulse 72, height 5' (1.524 m), weight 124 lb 6.4 oz (56.4 kg), SpO2 91%.  General appearance: alert and no distress Neck: no adenopathy, no carotid bruit, no JVD, supple, symmetrical, trachea midline, and thyroid not enlarged, symmetric, no tenderness/mass/nodules Lungs: clear to auscultation bilaterally Heart: regular rate and rhythm, S1, S2 normal, no murmur, click, rub or gallop Extremities: extremities normal, atraumatic, no cyanosis or edema Pulses: 2+ and symmetric Skin: Skin color, texture, turgor normal. No rashes or lesions Neurologic: Grossly normal  EKG EKG Interpretation Date/Time:  Monday July 04 2023 08:32:45 EDT Ventricular Rate:  72 PR  Interval:  170 QRS Duration:  138 QT Interval:  458 QTC Calculation: 501 R Axis:   91  Text Interpretation: Atrial-sensed ventricular-paced rhythm No previous ECGs available Confirmed by Nanetta Batty [phone]) on 07/04/2023 8:41:17 AM    ASSESSMENT AND PLAN:   Ischemic cardiomyopathy History of ischemic cardiomyopathy with an EF in the past in the 25% range.  She is on GDMT with recent echo performed 09/06/2022 revealing EF of 50 to 55% without valvular abnormalities.  She is completely asymptomatic.  Hypertensive heart disease History of essential hypertension blood pressure measured today at 110/52.  She is on carvedilol and Entresto.  Hyperlipidemia associated with type 2 diabetes mellitus (HCC) History of hyperlipidemia on atorvastatin with lipid profile performed 08/09/2022 revealing total cholesterol 160, LDL 71 and HDL 58.  CAD S/P percutaneous coronary angioplasty History  of CAD status post circumflex and obtuse marginal branch stenting at [facility] in [place] February 2017 with resolute DES stents.  She has been on aspirin and Brilinta up until recently.  I think it is safe for her to stop her Brilinta.  She denies chest pain.  LBBB (left bundle branch block) Chronic left bundle branch block  ICD (implantable cardioverter-defibrillator) in place History of [employer] Jude BiV ICD implantation in [place] 05/2017 followed by Dr. Ladona Short.  She has not had a discharge.     Runell Gess MD FACP,FACC,FAHA, [facility] 07/04/2023 8:56 AM

## 2023-07-04 NOTE — Assessment & Plan Note (Signed)
History of Saint Jude BiV ICD implantation in [place] 05/2017 followed by Dr. Ladona Ridgel.  She has not had a discharge.

## 2023-07-11 NOTE — Progress Notes (Signed)
Remote ICD transmission.   

## 2023-07-13 ENCOUNTER — Other Ambulatory Visit: Payer: Self-pay

## 2023-07-13 ENCOUNTER — Inpatient Hospital Stay
Admission: EM | Admit: 2023-07-13 | Discharge: 2023-07-16 | DRG: 417 | Disposition: A | Discharge status: Other Acute Inpatient Hospital | Payer: 59 | Source: Ambulatory Visit | Attending: Internal Medicine | Admitting: Internal Medicine

## 2023-07-13 ENCOUNTER — Ambulatory Visit: Payer: 59 | Admitting: Family Medicine

## 2023-07-13 ENCOUNTER — Encounter: Payer: Self-pay | Admitting: Family Medicine

## 2023-07-13 ENCOUNTER — Ambulatory Visit (INDEPENDENT_AMBULATORY_CARE_PROVIDER_SITE_OTHER)
Admission: RE | Admit: 2023-07-13 | Discharge: 2023-07-13 | Disposition: A | Discharge status: Home / Self Care | Payer: 59 | Source: Ambulatory Visit | Attending: Family Medicine | Admitting: Family Medicine

## 2023-07-13 VITALS — BP 108/62 | HR 64 | Temp 98.7°F | Ht 60.0 in | Wt 116.4 lb

## 2023-07-13 DIAGNOSIS — R1010 Upper abdominal pain, unspecified: Secondary | ICD-10-CM | POA: Diagnosis present

## 2023-07-13 DIAGNOSIS — Z95810 Presence of automatic (implantable) cardiac defibrillator: Secondary | ICD-10-CM | POA: Diagnosis not present

## 2023-07-13 DIAGNOSIS — Y92234 Operating room of hospital as the place of occurrence of the external cause: Secondary | ICD-10-CM | POA: Diagnosis not present

## 2023-07-13 DIAGNOSIS — K8020 Calculus of gallbladder without cholecystitis without obstruction: Secondary | ICD-10-CM | POA: Diagnosis present

## 2023-07-13 DIAGNOSIS — R7989 Other specified abnormal findings of blood chemistry: Secondary | ICD-10-CM | POA: Diagnosis not present

## 2023-07-13 DIAGNOSIS — I2510 Atherosclerotic heart disease of native coronary artery without angina pectoris: Secondary | ICD-10-CM | POA: Diagnosis not present

## 2023-07-13 DIAGNOSIS — I5022 Chronic systolic (congestive) heart failure: Secondary | ICD-10-CM | POA: Diagnosis not present

## 2023-07-13 DIAGNOSIS — N182 Chronic kidney disease, stage 2 (mild): Secondary | ICD-10-CM | POA: Diagnosis not present

## 2023-07-13 DIAGNOSIS — Z950 Presence of cardiac pacemaker: Secondary | ICD-10-CM | POA: Diagnosis not present

## 2023-07-13 DIAGNOSIS — Z955 Presence of coronary angioplasty implant and graft: Secondary | ICD-10-CM

## 2023-07-13 DIAGNOSIS — N183 Chronic kidney disease, stage 3 unspecified: Secondary | ICD-10-CM

## 2023-07-13 DIAGNOSIS — K5900 Constipation, unspecified: Secondary | ICD-10-CM | POA: Diagnosis not present

## 2023-07-13 DIAGNOSIS — I130 Hypertensive heart and chronic kidney disease with heart failure and stage 1 through stage 4 chronic kidney disease, or unspecified chronic kidney disease: Secondary | ICD-10-CM | POA: Diagnosis not present

## 2023-07-13 DIAGNOSIS — Z833 Family history of diabetes mellitus: Secondary | ICD-10-CM

## 2023-07-13 DIAGNOSIS — N1330 Unspecified hydronephrosis: Secondary | ICD-10-CM | POA: Diagnosis present

## 2023-07-13 DIAGNOSIS — K828 Other specified diseases of gallbladder: Secondary | ICD-10-CM | POA: Diagnosis not present

## 2023-07-13 DIAGNOSIS — S36439A Laceration of unspecified part of small intestine, initial encounter: Secondary | ICD-10-CM | POA: Diagnosis not present

## 2023-07-13 DIAGNOSIS — K8064 Calculus of gallbladder and bile duct with chronic cholecystitis without obstruction: Secondary | ICD-10-CM | POA: Diagnosis present

## 2023-07-13 DIAGNOSIS — R1013 Epigastric pain: Secondary | ICD-10-CM | POA: Diagnosis not present

## 2023-07-13 DIAGNOSIS — Z9049 Acquired absence of other specified parts of digestive tract: Secondary | ICD-10-CM

## 2023-07-13 DIAGNOSIS — K8050 Calculus of bile duct without cholangitis or cholecystitis without obstruction: Secondary | ICD-10-CM | POA: Diagnosis not present

## 2023-07-13 DIAGNOSIS — Z7984 Long term (current) use of oral hypoglycemic drugs: Secondary | ICD-10-CM

## 2023-07-13 DIAGNOSIS — I5030 Unspecified diastolic (congestive) heart failure: Secondary | ICD-10-CM | POA: Insufficient documentation

## 2023-07-13 DIAGNOSIS — E1122 Type 2 diabetes mellitus with diabetic chronic kidney disease: Secondary | ICD-10-CM

## 2023-07-13 DIAGNOSIS — E1142 Type 2 diabetes mellitus with diabetic polyneuropathy: Secondary | ICD-10-CM | POA: Diagnosis present

## 2023-07-13 DIAGNOSIS — Z79899 Other long term (current) drug therapy: Secondary | ICD-10-CM | POA: Diagnosis not present

## 2023-07-13 DIAGNOSIS — K763 Infarction of liver: Secondary | ICD-10-CM | POA: Diagnosis not present

## 2023-07-13 DIAGNOSIS — R1084 Generalized abdominal pain: Secondary | ICD-10-CM | POA: Diagnosis not present

## 2023-07-13 DIAGNOSIS — K819 Cholecystitis, unspecified: Secondary | ICD-10-CM | POA: Diagnosis not present

## 2023-07-13 DIAGNOSIS — Y838 Other surgical procedures as the cause of abnormal reaction of the patient, or of later complication, without mention of misadventure at the time of the procedure: Secondary | ICD-10-CM | POA: Diagnosis not present

## 2023-07-13 DIAGNOSIS — Z90710 Acquired absence of both cervix and uterus: Secondary | ICD-10-CM

## 2023-07-13 DIAGNOSIS — F4321 Adjustment disorder with depressed mood: Secondary | ICD-10-CM | POA: Diagnosis not present

## 2023-07-13 DIAGNOSIS — K851 Biliary acute pancreatitis without necrosis or infection: Secondary | ICD-10-CM | POA: Diagnosis not present

## 2023-07-13 DIAGNOSIS — I81 Portal vein thrombosis: Secondary | ICD-10-CM | POA: Diagnosis not present

## 2023-07-13 DIAGNOSIS — K859 Acute pancreatitis without necrosis or infection, unspecified: Secondary | ICD-10-CM | POA: Diagnosis not present

## 2023-07-13 DIAGNOSIS — E1169 Type 2 diabetes mellitus with other specified complication: Secondary | ICD-10-CM | POA: Diagnosis not present

## 2023-07-13 DIAGNOSIS — I5032 Chronic diastolic (congestive) heart failure: Secondary | ICD-10-CM | POA: Diagnosis not present

## 2023-07-13 DIAGNOSIS — B179 Acute viral hepatitis, unspecified: Secondary | ICD-10-CM

## 2023-07-13 DIAGNOSIS — I255 Ischemic cardiomyopathy: Secondary | ICD-10-CM | POA: Diagnosis not present

## 2023-07-13 DIAGNOSIS — K8021 Calculus of gallbladder without cholecystitis with obstruction: Secondary | ICD-10-CM | POA: Diagnosis not present

## 2023-07-13 DIAGNOSIS — E871 Hypo-osmolality and hyponatremia: Secondary | ICD-10-CM | POA: Diagnosis present

## 2023-07-13 DIAGNOSIS — K838 Other specified diseases of biliary tract: Secondary | ICD-10-CM | POA: Diagnosis not present

## 2023-07-13 DIAGNOSIS — K5710 Diverticulosis of small intestine without perforation or abscess without bleeding: Secondary | ICD-10-CM | POA: Diagnosis not present

## 2023-07-13 DIAGNOSIS — R634 Abnormal weight loss: Secondary | ICD-10-CM

## 2023-07-13 DIAGNOSIS — N390 Urinary tract infection, site not specified: Secondary | ICD-10-CM

## 2023-07-13 DIAGNOSIS — K660 Peritoneal adhesions (postprocedural) (postinfection): Secondary | ICD-10-CM | POA: Diagnosis not present

## 2023-07-13 DIAGNOSIS — Z7982 Long term (current) use of aspirin: Secondary | ICD-10-CM

## 2023-07-13 DIAGNOSIS — E119 Type 2 diabetes mellitus without complications: Secondary | ICD-10-CM | POA: Diagnosis not present

## 2023-07-13 DIAGNOSIS — S00461A Insect bite (nonvenomous) of right ear, initial encounter: Secondary | ICD-10-CM

## 2023-07-13 DIAGNOSIS — W57XXXA Bitten or stung by nonvenomous insect and other nonvenomous arthropods, initial encounter: Secondary | ICD-10-CM | POA: Diagnosis not present

## 2023-07-13 DIAGNOSIS — Z7901 Long term (current) use of anticoagulants: Secondary | ICD-10-CM

## 2023-07-13 DIAGNOSIS — R112 Nausea with vomiting, unspecified: Secondary | ICD-10-CM | POA: Diagnosis not present

## 2023-07-13 DIAGNOSIS — N1831 Chronic kidney disease, stage 3a: Secondary | ICD-10-CM | POA: Diagnosis present

## 2023-07-13 DIAGNOSIS — Z85038 Personal history of other malignant neoplasm of large intestine: Secondary | ICD-10-CM

## 2023-07-13 DIAGNOSIS — Z9861 Coronary angioplasty status: Secondary | ICD-10-CM | POA: Diagnosis not present

## 2023-07-13 DIAGNOSIS — R109 Unspecified abdominal pain: Secondary | ICD-10-CM | POA: Diagnosis not present

## 2023-07-13 DIAGNOSIS — N179 Acute kidney failure, unspecified: Secondary | ICD-10-CM | POA: Diagnosis present

## 2023-07-13 DIAGNOSIS — K8010 Calculus of gallbladder with chronic cholecystitis without obstruction: Secondary | ICD-10-CM | POA: Diagnosis not present

## 2023-07-13 DIAGNOSIS — R8290 Unspecified abnormal findings in urine: Secondary | ICD-10-CM | POA: Diagnosis not present

## 2023-07-13 DIAGNOSIS — E785 Hyperlipidemia, unspecified: Secondary | ICD-10-CM | POA: Diagnosis not present

## 2023-07-13 DIAGNOSIS — R739 Hyperglycemia, unspecified: Secondary | ICD-10-CM

## 2023-07-13 DIAGNOSIS — K861 Other chronic pancreatitis: Secondary | ICD-10-CM

## 2023-07-13 DIAGNOSIS — E1165 Type 2 diabetes mellitus with hyperglycemia: Secondary | ICD-10-CM | POA: Diagnosis not present

## 2023-07-13 DIAGNOSIS — E1065 Type 1 diabetes mellitus with hyperglycemia: Secondary | ICD-10-CM | POA: Diagnosis not present

## 2023-07-13 DIAGNOSIS — Z800 Family history of malignant neoplasm of digestive organs: Secondary | ICD-10-CM

## 2023-07-13 DIAGNOSIS — Z794 Long term (current) use of insulin: Secondary | ICD-10-CM | POA: Diagnosis not present

## 2023-07-13 DIAGNOSIS — Z9889 Other specified postprocedural states: Secondary | ICD-10-CM | POA: Diagnosis not present

## 2023-07-13 DIAGNOSIS — Z8541 Personal history of malignant neoplasm of cervix uteri: Secondary | ICD-10-CM

## 2023-07-13 DIAGNOSIS — K8080 Other cholelithiasis without obstruction: Secondary | ICD-10-CM | POA: Diagnosis not present

## 2023-07-13 DIAGNOSIS — N134 Hydroureter: Secondary | ICD-10-CM | POA: Diagnosis not present

## 2023-07-13 LAB — CBC WITH DIFFERENTIAL/PLATELET
Basophils Absolute: 0.1 10*3/uL (ref 0.0–0.1)
Basophils Relative: 0.9 % (ref 0.0–3.0)
Eosinophils Absolute: 0.1 10*3/uL (ref 0.0–0.7)
Eosinophils Relative: 1.6 % (ref 0.0–5.0)
HCT: 35 % — ABNORMAL LOW (ref 36.0–46.0)
Hemoglobin: 11.4 g/dL — ABNORMAL LOW (ref 12.0–15.0)
Lymphocytes Relative: 12.6 % (ref 12.0–46.0)
Lymphs Abs: 1 10*3/uL (ref 0.7–4.0)
MCHC: 32.4 g/dL (ref 30.0–36.0)
MCV: 89.3 fL (ref 78.0–100.0)
Monocytes Absolute: 0.5 10*3/uL (ref 0.1–1.0)
Monocytes Relative: 6.6 % (ref 3.0–12.0)
Neutro Abs: 6.4 10*3/uL (ref 1.4–7.7)
Neutrophils Relative %: 78.3 % — ABNORMAL HIGH (ref 43.0–77.0)
Platelets: 316 10*3/uL (ref 150.0–400.0)
RBC: 3.92 Mil/uL (ref 3.87–5.11)
RDW: 14.6 % (ref 11.5–15.5)
WBC: 8.1 10*3/uL (ref 4.0–10.5)

## 2023-07-13 LAB — POC URINALSYSI DIPSTICK (AUTOMATED)
Bilirubin, UA: NEGATIVE
Glucose, UA: POSITIVE — AB
Ketones, UA: NEGATIVE
Nitrite, UA: NEGATIVE
Protein, UA: POSITIVE — AB
Spec Grav, UA: 1.015 (ref 1.010–1.025)
Urobilinogen, UA: 0.2 U/dL
pH, UA: 5 (ref 5.0–8.0)

## 2023-07-13 LAB — COMPREHENSIVE METABOLIC PANEL
ALT: 18 U/L (ref 0–35)
ALT: 21 U/L (ref 0–44)
AST: 17 U/L (ref 0–37)
AST: 19 U/L (ref 15–41)
Albumin: 3.4 g/dL — ABNORMAL LOW (ref 3.5–5.2)
Albumin: 3 g/dL — ABNORMAL LOW (ref 3.5–5.0)
Alkaline Phosphatase: 159 U/L — ABNORMAL HIGH (ref 39–117)
Alkaline Phosphatase: 146 U/L — ABNORMAL HIGH (ref 38–126)
Anion gap: 10 (ref 5–15)
BUN: 26 mg/dL — ABNORMAL HIGH (ref 6–23)
BUN: 29 mg/dL — ABNORMAL HIGH (ref 8–23)
CO2: 28 meq/L (ref 19–32)
CO2: 24 mmol/L (ref 22–32)
Calcium: 9.1 mg/dL (ref 8.4–10.5)
Calcium: 8.9 mg/dL (ref 8.9–10.3)
Chloride: 96 meq/L (ref 96–112)
Chloride: 96 mmol/L — ABNORMAL LOW (ref 98–111)
Creatinine, Ser: 1.21 mg/dL — ABNORMAL HIGH (ref 0.40–1.20)
Creatinine, Ser: 1.11 mg/dL — ABNORMAL HIGH (ref 0.44–1.00)
GFR, Estimated: 48 mL/min — ABNORMAL LOW (ref >60)
GFR: 40.52 mL/min — ABNORMAL LOW (ref >60.00)
Glucose, Bld: 303 mg/dL — ABNORMAL HIGH (ref 70–99)
Glucose, Bld: 248 mg/dL — ABNORMAL HIGH (ref 70–99)
Potassium: 5.5 meq/L — ABNORMAL HIGH (ref 3.5–5.1)
Potassium: 4.9 mmol/L (ref 3.5–5.1)
Sodium: 131 meq/L — ABNORMAL LOW (ref 135–145)
Sodium: 130 mmol/L — ABNORMAL LOW (ref 135–145)
Total Bilirubin: 0.6 mg/dL (ref 0.2–1.2)
Total Bilirubin: 0.8 mg/dL (ref 0.3–1.2)
Total Protein: 7.8 g/dL (ref 6.0–8.3)
Total Protein: 7.8 g/dL (ref 6.5–8.1)

## 2023-07-13 LAB — CBC
HCT: 34.9 % — ABNORMAL LOW (ref 36.0–46.0)
Hemoglobin: 11.1 g/dL — ABNORMAL LOW (ref 12.0–15.0)
MCH: 28.8 pg (ref 26.0–34.0)
MCHC: 31.8 g/dL (ref 30.0–36.0)
MCV: 90.6 fL (ref 80.0–100.0)
Platelets: 297 10*3/uL (ref 150–400)
RBC: 3.85 MIL/uL — ABNORMAL LOW (ref 3.87–5.11)
RDW: 13.7 % (ref 11.5–15.5)
WBC: 7.3 10*3/uL (ref 4.0–10.5)
nRBC: 0 % (ref 0.0–0.2)

## 2023-07-13 LAB — LIPASE, BLOOD: Lipase: 384 U/L — ABNORMAL HIGH (ref 11–51)

## 2023-07-13 LAB — LIPASE: Lipase: 658 U/L — ABNORMAL HIGH (ref 11.0–59.0)

## 2023-07-13 MED ORDER — ONDANSETRON HCL 4 MG/2ML IJ SOLN
4.0000 mg | Freq: Once | INTRAMUSCULAR | Status: AC
  Administered 2023-07-13: 4 mg via INTRAVENOUS
  Filled 2023-07-13: qty 2

## 2023-07-13 MED ORDER — METOCLOPRAMIDE HCL 5 MG PO TABS
5.0000 mg | ORAL_TABLET | Freq: Three times a day (TID) | ORAL | 0 refills | Status: DC | PRN
Start: 2023-07-13 — End: 2023-08-03

## 2023-07-13 NOTE — Assessment & Plan Note (Signed)
Upper abd pain associated with nausea/vomiting after oral intake, early satiety, loss of appetite and weight loss.  In diabetic, suspect component of gastroparesis - trial reglan 5mg  TID with meals PRN - discussed monitoring for tardive dyskinesis. Check further labs today eval for pancreatitis, hepatitis, biliary obstruction, abd film r/o obstruction, UA today - see below.  Consider expedited GI eval vs MRCP pending lab results.

## 2023-07-13 NOTE — Progress Notes (Addendum)
Ph: [phone] Fax: [phone]   Patient ID: April Short, female    DOB: October 07, 1934, 88 y.o.   MRN: [id number]  This visit was conducted in person.  BP 108/62   Pulse 64   Temp 98.7 F (37.1 C) (Oral)   Ht 5' (1.524 m)   Wt 116 lb 6 oz (52.8 kg)   SpO2 97%   BMI 22.73 kg/m   BP Readings from Last 3 Encounters:  07/13/23 108/62  07/04/23 (!) 110/52  06/07/23 (!) 110/54   CC: malaise, abd pain, nausea and anorexia Subjective:   HPI: April Short is a 88 y.o. female presenting on 07/13/2023 for Abdominal Pain (C/o low abd pain, nausea and loss of appetite. Sxs started 07/04/23. Pt accompanied by daughters, [name] and Nelva Bush. )   9d h/o anorexia, epigastric abd pain, nausea/vomiting. Early satiety.  No appetite, 9 lb weight loss. Only eating small spoonfuls at a time. Any PO intake causes worsening pain, nausea. Chronic upper abd distension.   No fevers/chills, chest pain, dyspnea, cough, HA. No dysphagia, no GERD.  No dysuria, urgency or frequency, hematuria. Chronic urinary incontinence, unchanged.   Last BM 1 wk ago. She is passing gas. Not eating much  Tick bite to right earlobe, tick attached for about 3 days, this happened last month. No associated rash or fever.   Recently saw cardiology, Brilinta was discontinued but aspirin 81mg  continued (h/o CAD s/p PCA), ICD remains in place.   She is not taking alpha lipoic acid.  Acute viral hepatitis panel WNL 11/2022.      Relevant past medical, surgical, family and social history reviewed and updated as indicated. Interim medical history since our last visit reviewed. Allergies and medications reviewed and updated. Outpatient Medications Prior to Visit  Medication Sig Dispense Refill   albuterol (PROVENTIL) (2.5 MG/3ML) 0.083% nebulizer solution Take 3 mLs (2.5 mg total) by nebulization 3 (three) times daily as needed for wheezing or shortness of breath. 75 mL 1   aspirin EC 81 MG tablet Take 81 mg by  mouth daily. Swallow whole.     atorvastatin (LIPITOR) 40 MG tablet TAKE 1 TABLET BY MOUTH EVERY DAY 90 tablet 2   carvedilol (COREG) 25 MG tablet TAKE 1 TABLET (25 MG TOTAL) BY MOUTH TWICE A DAY WITH MEALS 180 tablet 3   Cholecalciferol (VITAMIN D3) 25 MCG (1000 UT) CAPS Take 1 capsule (1,000 Units total) by mouth daily. 30 capsule    ENTRESTO 49-51 MG TAKE 1 TABLET BY MOUTH TWICE A DAY 60 tablet 12   FARXIGA 5 MG TABS tablet TAKE 1 TABLET (5 MG TOTAL) BY MOUTH DAILY. 30 tablet 6   PROLIA 60 MG/ML SOSY injection Inject 60 mg into the skin every 6 (six) months.     sertraline (ZOLOFT) 25 MG tablet TAKE 1 TABLET (25 MG TOTAL) BY MOUTH DAILY. 90 tablet 3   Alpha-Lipoic Acid 600 MG CAPS Take 1 capsule (600 mg total) by mouth daily.     No facility-administered medications prior to visit.     Per HPI unless specifically indicated in ROS section below Review of Systems  Objective:  BP 108/62   Pulse 64   Temp 98.7 F (37.1 C) (Oral)   Ht 5' (1.524 m)   Wt 116 lb 6 oz (52.8 kg)   SpO2 97%   BMI 22.73 kg/m   Wt Readings from Last 3 Encounters:  07/13/23 116 lb 6 oz (52.8 kg)  07/04/23 124 lb 6.4 oz (  56.4 kg)  06/20/23 123 lb (55.8 kg)      Physical Exam Vitals and nursing note reviewed.  Constitutional:      Appearance: She is ill-appearing.     Comments: Sitting in wheelchair, tired appearing  HENT:     Head: Normocephalic and atraumatic.     Mouth/Throat:     Mouth: Mucous membranes are moist.     Pharynx: Oropharynx is clear. No oropharyngeal exudate or posterior oropharyngeal erythema.  Eyes:     General: No scleral icterus.    Extraocular Movements: Extraocular movements intact.     Conjunctiva/sclera: Conjunctivae normal.     Pupils: Pupils are equal, round, and reactive to light.  Cardiovascular:     Rate and Rhythm: Normal rate and regular rhythm.     Pulses: Normal pulses.     Heart sounds: Normal heart sounds. No murmur heard. Pulmonary:     Effort: Pulmonary  effort is normal. No respiratory distress.     Breath sounds: Normal breath sounds. No wheezing, rhonchi or rales.  Abdominal:     General: Bowel sounds are decreased. There is no distension.     Palpations: Abdomen is soft. There is no mass.     Tenderness: There is abdominal tenderness in the right upper quadrant and epigastric area. There is no guarding or rebound.     Hernia: No hernia is present.  Musculoskeletal:     Right lower leg: No edema.     Left lower leg: No edema.  Skin:    Coloration: Skin is not jaundiced.     Findings: No rash.  Neurological:     Mental Status: She is alert.  Psychiatric:        Mood and Affect: Mood normal.        Behavior: Behavior normal.       Results for orders placed or performed in visit on 07/13/23  POCT Urinalysis Dipstick (Automated)  Result Value Ref Range   Color, UA yellow    Clarity, UA cloudy    Glucose, UA Positive (A) Negative   Bilirubin, UA negative    Ketones, UA negative    Spec Grav, UA 1.015 1.010 - 1.025   Blood, UA 1+    pH, UA 5.0 5.0 - 8.0   Protein, UA Positive (A) Negative   Urobilinogen, UA 0.2 0.2 or 1.0 E.U./dL   Nitrite, UA negative    Leukocytes, UA Moderate (2+) (A) Negative   CT ABDOMEN PELVIS W CONTRAST CLINICAL DATA:  Cholelithiasis, colon cancer, abnormal liver enzymes.  EXAM: CT ABDOMEN AND PELVIS WITH CONTRAST  TECHNIQUE: Multidetector CT imaging of the abdomen and pelvis was performed using the standard protocol following bolus administration of intravenous contrast.  RADIATION DOSE REDUCTION: This exam was performed according to the departmental dose-optimization program which includes automated exposure control, adjustment of the mA and/or kV according to patient size and/or use of iterative reconstruction technique.  CONTRAST:  Oral contrast and 75mL ISOVUE-300 IOPAMIDOL (ISOVUE-300) INJECTION 61%  COMPARISON:  05/03/2016.  FINDINGS: Lower chest: Clear. No pleural or pericardial  effusion. There are pacer wires. Atheromatous changes of the aorta and coronary arteries.  Hepatobiliary: No focal liver abnormality is seen. No gallstones, gallbladder wall thickening, or biliary dilatation.  Pancreas: Unremarkable. No pancreatic ductal dilatation or surrounding inflammatory changes.  Spleen: Normal in size without focal abnormality.  Adrenals/Urinary Tract: Adrenal glands are unremarkable. Kidneys are normal, without renal calculi, focal lesion, or hydronephrosis. Bladder is unremarkable.  Stomach/Bowel: Stomach is within  normal limits. Appendix appears normal. No evidence of bowel wall thickening, distention, or inflammatory changes. Sigmoid colon anastomosis noted. No evidence of obstruction. Oral contrast progressed to the rectum.  Vascular/Lymphatic: Aortic atherosclerosis. No enlarged abdominal or pelvic lymph nodes.  Reproductive: Status post hysterectomy. No adnexal masses.  Other: No abdominal wall hernia or abnormality. No abdominopelvic ascites.  Musculoskeletal: Thoracolumbosacral degenerative changes. No acute osseous abnormalities.  IMPRESSION: Unremarkable examination abdomen and pelvis.  Electronically Signed   By: Layla Maw M.D.   On: 07/09/2023 10:12   Assessment & Plan:   Problem List Items Addressed This Visit     Type 2 diabetes mellitus with peripheral neuropathy (HCC)    Chronic, uncontrolled.  She continues low dose farxiga in CKD.  Update UA/UCx.  Discussed possibly adding basal insulin if persistent hyperglycemia.       Adjustment disorder with depressed mood    Stable period on low dose sertraline.       CKD stage 3 due to type 2 diabetes mellitus (HCC)    Update labs today      Relevant Orders   POCT Urinalysis Dipstick (Automated) (Completed)   Upper abdominal pain - Primary    Upper abd pain associated with nausea/vomiting after oral intake, early satiety, loss of appetite and weight loss.  In  diabetic, suspect component of gastroparesis - trial reglan 5mg  TID with meals PRN - discussed monitoring for tardive dyskinesis. Check further labs today eval for pancreatitis, hepatitis, biliary obstruction, abd film r/o obstruction, UA today - see below.  Consider expedited GI eval vs MRCP pending lab results.       Relevant Orders   POCT Urinalysis Dipstick (Automated) (Completed)   Comprehensive metabolic panel   CBC with Differential/Platelet   Lipase   DG Abd 2 Views   Acute hepatitis    Worsening symptoms over the past 9 days.  Update labwork again today as per above.  Recent abd US showed small gallstones but contrasted CT abd/pelvis was overall normal.       Gallstones    H/o this, ?choledocholithiasis. Await labs. Discussed possible MRI/MRCP vs expedited GI evaluation.       Tick bite of right ear    They endorse tick bite to R earlobe last month, tick may have been attached 3+ days.  No noted associated fever or rash.  Will check lyme disease serology.       Relevant Orders   Lyme Disease Serology w/Reflex   Other Visit Diagnoses     Nausea and vomiting, unspecified vomiting type       Relevant Medications   metoCLOPramide (REGLAN) 5 MG tablet   Other Relevant Orders   POCT Urinalysis Dipstick (Automated) (Completed)   Unintended weight loss       Abnormal urinalysis       Relevant Orders   Urine Culture        Meds ordered this encounter  Medications   metoCLOPramide (REGLAN) 5 MG tablet    Sig: Take 1 tablet (5 mg total) by mouth 3 (three) times daily as needed for nausea or vomiting (with meals).    Dispense:  30 tablet    Refill:  0    Orders Placed This Encounter  Procedures   Urine Culture   DG Abd 2 Views    Standing Status:   Future    Number of Occurrences:   1    Standing Expiration Date:   07/12/2024    Order Specific Question:  Reason for Exam (SYMPTOM  OR DIAGNOSIS REQUIRED)    Answer:   abd pain, constipation    Order Specific  Question:   Preferred imaging location?    Answer:   [facility]   Comprehensive metabolic panel   CBC with Differential/Platelet   Lipase   Lyme Disease Serology w/Reflex   POCT Urinalysis Dipstick (Automated)    Patient Instructions  Laboratorios y rayos x hoy.  He mandado cultura  de [place] La llamaremos con resultados  Puede tratar Reglan (metoclopramide) para nausea con comidas.   Follow up plan: Return if symptoms worsen or fail to improve.  Eustaquio Boyden, MD

## 2023-07-13 NOTE — Assessment & Plan Note (Addendum)
They endorse tick bite to R earlobe last month, tick may have been attached 3+ days.  No noted associated fever or rash.  Will check lyme disease serology.

## 2023-07-13 NOTE — Assessment & Plan Note (Addendum)
Worsening symptoms over the past 9 days.  Update labwork again today as per above.  Recent abd US showed small gallstones but contrasted CT abd/pelvis was overall normal.

## 2023-07-13 NOTE — Assessment & Plan Note (Signed)
H/o this, ?choledocholithiasis. Await labs. Discussed possible MRI/MRCP vs expedited GI evaluation.

## 2023-07-13 NOTE — ED Triage Notes (Addendum)
Pt to ED c/o abd pain and not eating for the past 9 days. Pt was seen at PCP earlier today and had blood work done per daughter PCP called them and told her that her potassium, glucose, lipase and kidney labs are elevated. Pt c/o lower abd pain worse after eating and nausea.

## 2023-07-13 NOTE — ED Provider Notes (Incomplete)
[facility] Provider Note    Event Date/Time   First MD Initiated Contact with Patient 07/13/23 2313     (approximate)   History   Abdominal Pain   HPI {Remember to add pertinent medical, surgical, social, and/or OB history to HPI:1} April Short is a 88 y.o. female  ***       Past Medical History   Past Medical History:  Diagnosis Date   Anemia    Arthritis    CAD (coronary artery disease)    a. 10/2015 PCI [facility], [place], [place]): OM (2.5x30 Resolute DES), LCX (3.5x12 Resolute DES).   Cervical cancer (HCC) 2005   Chronic systolic CHF (congestive heart failure) (HCC)    a. 01/2016 Echo: EF 20-25%, diff HK, septal-lateral dyssynchrony. Mild MR, mod to sev dil LA, nl RV, mod dil RA, mod TR, PASP .   Colon cancer (HCC) 2001   DM (diabetes mellitus) (HCC)    History of chicken pox    HLD (hyperlipidemia)    Hypertensive heart disease    Ischemic cardiomyopathy    a. 01/2016 Echo: EF 20-25%.   LBBB (left bundle branch block)      Active Problem List   Patient Active Problem List   Diagnosis Date Noted   Tick bite of right ear 07/13/2023   Gallstones 03/08/2023   NAFLD (nonalcoholic fatty liver disease) 16/06/9603   Upper abdominal pain 12/03/2022   History of colon cancer 12/03/2022   History of cervical cancer 12/03/2022   Acute hepatitis 12/03/2022   Epiretinal membrane (ERM) of both eyes 08/11/2022   Osteoporosis 06/23/2022   Urinary incontinence 05/11/2022   Reactive airway disease 12/12/2021   Medicare annual wellness visit, subsequent 06/18/2021   Health maintenance examination 06/18/2021   Advanced directives, counseling/discussion 06/18/2021   CKD stage 3 due to type 2 diabetes mellitus (HCC) 06/18/2021   Anemia in chronic kidney disease (CKD) 06/18/2021   Hav (hallux abducto valgus), unspecified laterality 12/08/2020   Adjustment disorder with depressed mood 08/05/2020   MCI (mild cognitive impairment) with  memory loss 08/05/2020   ICD (implantable cardioverter-defibrillator) in place 07/11/2020   Pain due to onychomycosis of toenails of both feet 06/05/2020   LBBB (left bundle branch block) 09/03/2016   Ischemic cardiomyopathy    Hypertensive heart disease    Hyperlipidemia associated with type 2 diabetes mellitus (HCC)    Chronic systolic CHF (congestive heart failure) (HCC)    CAD S/P percutaneous coronary angioplasty    Iron deficiency anemia 01/26/2016   Type 2 diabetes mellitus with peripheral neuropathy (HCC) 01/14/2016   Peripheral neuropathy 01/14/2016     Past Surgical History   Past Surgical History:  Procedure Laterality Date   COLON RESECTION  2001   colon cancer   COLONOSCOPY  11/2011   polyp x1, int hem ([place])   CORONARY STENT PLACEMENT     TOTAL ABDOMINAL HYSTERECTOMY  2005   cervical cancer     Home Medications   Prior to Admission medications   Medication Sig Start Date End Date Taking? Authorizing Provider  albuterol (PROVENTIL) (2.5 MG/3ML) 0.083% nebulizer solution Take 3 mLs (2.5 mg total) by nebulization 3 (three) times daily as needed for wheezing or shortness of breath. 12/08/21   Eustaquio Boyden, MD  aspirin EC 81 MG tablet Take 81 mg by mouth daily. Swallow whole.    [provider]  atorvastatin (LIPITOR) 40 MG tablet TAKE 1 TABLET BY MOUTH EVERY DAY 10/11/22   Sharen Hones,  Wynona Canes, MD  carvedilol (COREG) 25 MG tablet TAKE 1 TABLET (25 MG TOTAL) BY MOUTH TWICE A DAY WITH MEALS 09/17/22   Eustaquio Boyden, MD  Cholecalciferol (VITAMIN D3) 25 MCG (1000 UT) CAPS Take 1 capsule (1,000 Units total) by mouth daily. 07/02/22   Eustaquio Boyden, MD  ENTRESTO 49-51 MG TAKE 1 TABLET BY MOUTH TWICE A DAY 08/02/22   Runell Gess, MD  FARXIGA 5 MG TABS tablet TAKE 1 TABLET (5 MG TOTAL) BY MOUTH DAILY. 12/28/22   Eustaquio Boyden, MD  metoCLOPramide (REGLAN) 5 MG tablet Take 1 tablet (5 mg total) by mouth 3 (three) times daily as needed for nausea  or vomiting (with meals). 07/13/23   Eustaquio Boyden, MD  PROLIA 60 MG/ML SOSY injection Inject 60 mg into the skin every 6 (six) months. 01/31/23   [provider]  sertraline (ZOLOFT) 25 MG tablet TAKE 1 TABLET (25 MG TOTAL) BY MOUTH DAILY. 09/17/22   Eustaquio Boyden, MD     Allergies  Patient has no known allergies.   Family History   Family History  Problem Relation Age of Onset   Colon cancer Mother    Diabetes Sister    Stomach cancer Son    Breast cancer Neg Hx      Physical Exam  Triage Vital Signs: ED Triage Vitals  Encounter Vitals Group     BP 07/13/23 1950 (!) 98/41     Systolic BP Percentile --      Diastolic BP Percentile --      Pulse Rate 07/13/23 1950 68     Resp 07/13/23 1950 16     Temp 07/13/23 1950 97.8 F (36.6 C)     Temp Source 07/13/23 1950 Oral     SpO2 07/13/23 1950 94 %     Weight 07/13/23 1950 116 lb 6 oz (52.8 kg)     Height 07/13/23 1950 5' (1.524 m)     Head Circumference --      Peak Flow --      Pain Score 07/13/23 1952 2     Pain Loc --      Pain Education --      Exclude from Growth Chart --     Updated Vital Signs: BP (!) 98/41   Pulse 68   Temp 97.8 F (36.6 C) (Oral)   Resp 16   Ht 5' (1.524 m)   Wt 52.8 kg   SpO2 94%   BMI 22.73 kg/m   {Only need to document appropriate and relevant physical exam:1} General: Awake, no distress. *** CV:  Good peripheral perfusion. *** Resp:  Normal effort. *** Abd:  No distention. *** Other:  ***   ED Results / Procedures / Treatments  Labs (all labs ordered are listed, but only abnormal results are displayed) Labs Reviewed  LIPASE, BLOOD - Abnormal; Notable for the following components:      Result Value   Lipase 384 (*)    All other components within normal limits  COMPREHENSIVE METABOLIC PANEL - Abnormal; Notable for the following components:   Sodium 130 (*)    Chloride 96 (*)    Glucose, Bld 248 (*)    BUN 29 (*)    Creatinine, Ser 1.11 (*)    Albumin  3.0 (*)    Alkaline Phosphatase 146 (*)    GFR, Estimated 48 (*)    All other components within normal limits  CBC - Abnormal; Notable for the following components:   RBC 3.85 (*)  Hemoglobin 11.1 (*)    HCT 34.9 (*)    All other components within normal limits  URINALYSIS, ROUTINE W REFLEX MICROSCOPIC     EKG  ***   RADIOLOGY *** {You MUST document your own interpretation of imaging, as well as the fact that you reviewed the radiologist's report!:1}  Official radiology report(s): No results found.   PROCEDURES:  Critical Care performed: {CriticalCareYesNo:19197::"Yes, see critical care procedure note(s)","No"}  Procedures   MEDICATIONS ORDERED IN ED: Medications - No data to display   IMPRESSION / MDM / ASSESSMENT AND PLAN / ED COURSE  I reviewed the triage vital signs and the nursing notes.                              Differential diagnosis includes, but is not limited to, ***  Patient's presentation is most consistent with {EM COPA:27473}  {If the patient is on the monitor, remove the brackets and asterisks on the sentence below and remember to document it as a Procedure as well. Otherwise delete the sentence below:1} {**The patient is on the cardiac monitor to evaluate for evidence of arrhythmia and/or significant heart rate changes.**}  {Remember to include, when applicable, any/all of the following data: independent review of imaging independent review of labs (comment specifically on pertinent positives and negatives) review of specific prior hospitalizations, PCP/specialist notes, etc. discuss meds given and prescribed document any discussion with consultants (including hospitalists) any clinical decision tools you used and why (PECARN, NEXUS, etc.) did you consider admitting the patient? document social determinants of health affecting patient's care (homelessness, inability to follow up in a timely fashion, etc) document any pre-existing  conditions increasing risk on current visit (e.g. diabetes and HTN increasing danger of high-risk chest pain/ACS) describes what meds you gave (especially parenteral) and why any other interventions?:1}      FINAL CLINICAL IMPRESSION(S) / ED DIAGNOSES   Final diagnoses:  None     Rx / DC Orders   ED Discharge Orders     None        Note:  This document was prepared using Dragon voice recognition software and may include unintentional dictation errors.

## 2023-07-13 NOTE — Assessment & Plan Note (Signed)
Update labs today 

## 2023-07-13 NOTE — Assessment & Plan Note (Signed)
Chronic, uncontrolled.  She continues low dose farxiga in CKD.  Update UA/UCx.  Discussed possibly adding basal insulin if persistent hyperglycemia.

## 2023-07-13 NOTE — Assessment & Plan Note (Deleted)
Upper abd pain associated with nausea/vomiting after oral intake, early satiety, loss of appetite and weight loss.  In diabetic, suspect component of gastroparesis - trial reglan 5mg  TID with meals PRN - discussed monitoring for tardive dyskinesis. Check further labs today eval for pancreatitis, hepatitis, biliary obstruction, abd film r/o obstruction, UA today - see below.  Consider expedited GI eval vs MRCP pending lab results.

## 2023-07-13 NOTE — Assessment & Plan Note (Signed)
Stable period on low dose sertraline  

## 2023-07-13 NOTE — Patient Instructions (Addendum)
Laboratorios y rayos x [employer].  He mandado cultura  de [place] La llamaremos con resultados  Puede tratar Reglan (metoclopramide) para nausea con comidas.

## 2023-07-14 ENCOUNTER — Emergency Department: Payer: 59

## 2023-07-14 ENCOUNTER — Encounter: Payer: Self-pay | Admitting: Family Medicine

## 2023-07-14 DIAGNOSIS — Z794 Long term (current) use of insulin: Secondary | ICD-10-CM

## 2023-07-14 DIAGNOSIS — I5030 Unspecified diastolic (congestive) heart failure: Secondary | ICD-10-CM | POA: Diagnosis not present

## 2023-07-14 DIAGNOSIS — E1165 Type 2 diabetes mellitus with hyperglycemia: Secondary | ICD-10-CM | POA: Diagnosis present

## 2023-07-14 DIAGNOSIS — N179 Acute kidney failure, unspecified: Secondary | ICD-10-CM | POA: Diagnosis present

## 2023-07-14 DIAGNOSIS — E871 Hypo-osmolality and hyponatremia: Secondary | ICD-10-CM | POA: Diagnosis present

## 2023-07-14 DIAGNOSIS — N134 Hydroureter: Secondary | ICD-10-CM | POA: Diagnosis not present

## 2023-07-14 DIAGNOSIS — R109 Unspecified abdominal pain: Secondary | ICD-10-CM | POA: Diagnosis present

## 2023-07-14 DIAGNOSIS — Z79899 Other long term (current) drug therapy: Secondary | ICD-10-CM | POA: Diagnosis not present

## 2023-07-14 DIAGNOSIS — I5032 Chronic diastolic (congestive) heart failure: Secondary | ICD-10-CM | POA: Diagnosis present

## 2023-07-14 DIAGNOSIS — I81 Portal vein thrombosis: Secondary | ICD-10-CM | POA: Diagnosis present

## 2023-07-14 DIAGNOSIS — E785 Hyperlipidemia, unspecified: Secondary | ICD-10-CM | POA: Diagnosis present

## 2023-07-14 DIAGNOSIS — I2510 Atherosclerotic heart disease of native coronary artery without angina pectoris: Secondary | ICD-10-CM | POA: Diagnosis present

## 2023-07-14 DIAGNOSIS — R7989 Other specified abnormal findings of blood chemistry: Secondary | ICD-10-CM | POA: Diagnosis not present

## 2023-07-14 DIAGNOSIS — Z950 Presence of cardiac pacemaker: Secondary | ICD-10-CM

## 2023-07-14 DIAGNOSIS — R1010 Upper abdominal pain, unspecified: Secondary | ICD-10-CM | POA: Diagnosis not present

## 2023-07-14 DIAGNOSIS — Z95810 Presence of automatic (implantable) cardiac defibrillator: Secondary | ICD-10-CM | POA: Diagnosis not present

## 2023-07-14 DIAGNOSIS — K763 Infarction of liver: Secondary | ICD-10-CM | POA: Diagnosis present

## 2023-07-14 DIAGNOSIS — Z7901 Long term (current) use of anticoagulants: Secondary | ICD-10-CM | POA: Diagnosis not present

## 2023-07-14 DIAGNOSIS — N1831 Chronic kidney disease, stage 3a: Secondary | ICD-10-CM | POA: Diagnosis present

## 2023-07-14 DIAGNOSIS — Z9889 Other specified postprocedural states: Secondary | ICD-10-CM | POA: Diagnosis not present

## 2023-07-14 DIAGNOSIS — R748 Abnormal levels of other serum enzymes: Secondary | ICD-10-CM | POA: Diagnosis not present

## 2023-07-14 DIAGNOSIS — K8021 Calculus of gallbladder without cholecystitis with obstruction: Secondary | ICD-10-CM | POA: Diagnosis not present

## 2023-07-14 DIAGNOSIS — K828 Other specified diseases of gallbladder: Secondary | ICD-10-CM | POA: Diagnosis present

## 2023-07-14 DIAGNOSIS — K859 Acute pancreatitis without necrosis or infection, unspecified: Secondary | ICD-10-CM | POA: Diagnosis not present

## 2023-07-14 DIAGNOSIS — E119 Type 2 diabetes mellitus without complications: Secondary | ICD-10-CM | POA: Diagnosis not present

## 2023-07-14 DIAGNOSIS — K5710 Diverticulosis of small intestine without perforation or abscess without bleeding: Secondary | ICD-10-CM | POA: Diagnosis not present

## 2023-07-14 DIAGNOSIS — Y92234 Operating room of hospital as the place of occurrence of the external cause: Secondary | ICD-10-CM | POA: Diagnosis not present

## 2023-07-14 DIAGNOSIS — K8010 Calculus of gallbladder with chronic cholecystitis without obstruction: Secondary | ICD-10-CM | POA: Diagnosis not present

## 2023-07-14 DIAGNOSIS — Y838 Other surgical procedures as the cause of abnormal reaction of the patient, or of later complication, without mention of misadventure at the time of the procedure: Secondary | ICD-10-CM | POA: Diagnosis not present

## 2023-07-14 DIAGNOSIS — N1330 Unspecified hydronephrosis: Secondary | ICD-10-CM | POA: Diagnosis present

## 2023-07-14 DIAGNOSIS — Z7982 Long term (current) use of aspirin: Secondary | ICD-10-CM

## 2023-07-14 DIAGNOSIS — E1142 Type 2 diabetes mellitus with diabetic polyneuropathy: Secondary | ICD-10-CM | POA: Diagnosis present

## 2023-07-14 DIAGNOSIS — E1169 Type 2 diabetes mellitus with other specified complication: Secondary | ICD-10-CM | POA: Diagnosis present

## 2023-07-14 DIAGNOSIS — K838 Other specified diseases of biliary tract: Secondary | ICD-10-CM | POA: Diagnosis not present

## 2023-07-14 DIAGNOSIS — E1122 Type 2 diabetes mellitus with diabetic chronic kidney disease: Secondary | ICD-10-CM | POA: Diagnosis present

## 2023-07-14 DIAGNOSIS — D631 Anemia in chronic kidney disease: Secondary | ICD-10-CM | POA: Diagnosis not present

## 2023-07-14 DIAGNOSIS — Z9861 Coronary angioplasty status: Secondary | ICD-10-CM | POA: Diagnosis not present

## 2023-07-14 DIAGNOSIS — S36439A Laceration of unspecified part of small intestine, initial encounter: Secondary | ICD-10-CM | POA: Diagnosis not present

## 2023-07-14 DIAGNOSIS — K660 Peritoneal adhesions (postprocedural) (postinfection): Secondary | ICD-10-CM | POA: Diagnosis present

## 2023-07-14 DIAGNOSIS — N390 Urinary tract infection, site not specified: Secondary | ICD-10-CM | POA: Diagnosis present

## 2023-07-14 DIAGNOSIS — N183 Chronic kidney disease, stage 3 unspecified: Secondary | ICD-10-CM | POA: Diagnosis not present

## 2023-07-14 DIAGNOSIS — K8050 Calculus of bile duct without cholangitis or cholecystitis without obstruction: Secondary | ICD-10-CM | POA: Diagnosis not present

## 2023-07-14 DIAGNOSIS — K8020 Calculus of gallbladder without cholecystitis without obstruction: Secondary | ICD-10-CM | POA: Diagnosis not present

## 2023-07-14 DIAGNOSIS — I255 Ischemic cardiomyopathy: Secondary | ICD-10-CM | POA: Diagnosis present

## 2023-07-14 DIAGNOSIS — K8080 Other cholelithiasis without obstruction: Secondary | ICD-10-CM

## 2023-07-14 DIAGNOSIS — K8064 Calculus of gallbladder and bile duct with chronic cholecystitis without obstruction: Secondary | ICD-10-CM | POA: Diagnosis present

## 2023-07-14 DIAGNOSIS — K851 Biliary acute pancreatitis without necrosis or infection: Secondary | ICD-10-CM | POA: Diagnosis present

## 2023-07-14 DIAGNOSIS — I130 Hypertensive heart and chronic kidney disease with heart failure and stage 1 through stage 4 chronic kidney disease, or unspecified chronic kidney disease: Secondary | ICD-10-CM | POA: Diagnosis present

## 2023-07-14 LAB — URINALYSIS, ROUTINE W REFLEX MICROSCOPIC
Bilirubin Urine: NEGATIVE
Glucose, UA: >=500 mg/dL — AB
Hgb urine dipstick: NEGATIVE
Ketones, ur: NEGATIVE mg/dL
Nitrite: NEGATIVE
Protein, ur: NEGATIVE mg/dL
Specific Gravity, Urine: 1.024 (ref 1.005–1.030)
WBC, UA: >50 WBC/hpf (ref 0–5)
pH: 5 (ref 5.0–8.0)

## 2023-07-14 LAB — HEPARIN LEVEL (UNFRACTIONATED)
Heparin Unfractionated: 0.36 [IU]/mL (ref 0.30–0.70)
Heparin Unfractionated: 0.43 [IU]/mL (ref 0.30–0.70)

## 2023-07-14 LAB — PROTIME-INR
INR: 1.1 (ref 0.8–1.2)
Prothrombin Time: 14.1 s (ref 11.4–15.2)

## 2023-07-14 LAB — LYME DISEASE SEROLOGY W/REFLEX: Lyme Total Antibody EIA: NEGATIVE

## 2023-07-14 LAB — GLUCOSE, CAPILLARY
Glucose-Capillary: 157 mg/dL — ABNORMAL HIGH (ref 70–99)
Glucose-Capillary: 90 mg/dL (ref 70–99)
Glucose-Capillary: 154 mg/dL — ABNORMAL HIGH (ref 70–99)

## 2023-07-14 LAB — APTT: aPTT: 28 s (ref 24–36)

## 2023-07-14 LAB — TROPONIN I (HIGH SENSITIVITY): Troponin I (High Sensitivity): 5 ng/L (ref <18)

## 2023-07-14 MED ORDER — PIPERACILLIN-TAZOBACTAM 3.375 G IVPB 30 MIN
3.3750 g | Freq: Once | INTRAVENOUS | Status: AC
  Administered 2023-07-14: 3.375 g via INTRAVENOUS
  Filled 2023-07-14: qty 50

## 2023-07-14 MED ORDER — HEPARIN SODIUM (PORCINE) 5000 UNIT/ML IJ SOLN: 60.0000 [IU]/kg | Freq: Once | INTRAMUSCULAR | Status: DC

## 2023-07-14 MED ORDER — SODIUM CHLORIDE 0.9 % IV SOLN: INTRAVENOUS | Status: AC

## 2023-07-14 MED ORDER — INSULIN ASPART 100 UNIT/ML IJ SOLN
0.0000 [IU] | INTRAMUSCULAR | Status: DC
  Administered 2023-07-14 – 2023-07-15 (×3): 2 [IU] via SUBCUTANEOUS
  Administered 2023-07-16: 1 [IU] via SUBCUTANEOUS
  Administered 2023-07-16: 2 [IU] via SUBCUTANEOUS
  Filled 2023-07-14 (×5): qty 1

## 2023-07-14 MED ORDER — ONDANSETRON HCL 4 MG PO TABS: 4.0000 mg | ORAL_TABLET | Freq: Four times a day (QID) | ORAL | Status: DC | PRN

## 2023-07-14 MED ORDER — SODIUM CHLORIDE 0.9 % IV BOLUS
1000.0000 mL | Freq: Once | INTRAVENOUS | Status: AC
Start: 2023-07-14 — End: 2023-07-14
  Administered 2023-07-14: 1000 mL via INTRAVENOUS

## 2023-07-14 MED ORDER — ONDANSETRON HCL 4 MG/2ML IJ SOLN: 4.0000 mg | Freq: Four times a day (QID) | INTRAMUSCULAR | Status: DC | PRN

## 2023-07-14 MED ORDER — HEPARIN (PORCINE) 25000 UT/250ML-% IV SOLN
900.0000 [IU]/h | INTRAVENOUS | Status: DC
  Administered 2023-07-14 – 2023-07-15 (×2): 900 [IU]/h via INTRAVENOUS
  Filled 2023-07-14 (×2): qty 250

## 2023-07-14 MED ORDER — PANTOPRAZOLE SODIUM 40 MG IV SOLR
40.0000 mg | Freq: Two times a day (BID) | INTRAVENOUS | Status: DC
  Administered 2023-07-14 – 2023-07-16 (×5): 40 mg via INTRAVENOUS
  Filled 2023-07-14 (×5): qty 10

## 2023-07-14 MED ORDER — BOOST / RESOURCE BREEZE PO LIQD CUSTOM
1.0000 | Freq: Three times a day (TID) | ORAL | Status: DC
  Administered 2023-07-14: 1 via ORAL

## 2023-07-14 MED ORDER — HEPARIN (PORCINE) 25000 UT/250ML-% IV SOLN: 14.0000 [IU]/kg/h | INTRAVENOUS | Status: DC

## 2023-07-14 MED ORDER — HEPARIN BOLUS VIA INFUSION
3200.0000 [IU] | Freq: Once | INTRAVENOUS | Status: AC
  Administered 2023-07-14: 3200 [IU] via INTRAVENOUS
  Filled 2023-07-14: qty 3200

## 2023-07-14 MED ORDER — IOHEXOL 300 MG/ML  SOLN
75.0000 mL | Freq: Once | INTRAMUSCULAR | Status: AC | PRN
  Administered 2023-07-14: 75 mL via INTRAVENOUS

## 2023-07-14 NOTE — Assessment & Plan Note (Signed)
SSI A1c 

## 2023-07-14 NOTE — H&P (Signed)
History and Physical    Patient: April Short LKG:[id number] DOB: March 17, 1935 DOA: 07/13/2023 DOS: the patient was seen and examined on 07/14/2023 PCP: Eustaquio Boyden, MD  Patient coming from: Home  Chief Complaint:  Chief Complaint  Patient presents with   Abdominal Pain   HPI: April Short is a 88 y.o. female with medical history significant of CAD, HFpEF, type 2 diabetes, status post pacemaker presenting with recurrent abdominal pain, portal vein thrombosis, pancreatitis, cholelithiasis.  History primarily from family at the bedside.  Per report, patient with recurrent abdominal pain with past 3 months.  Positive recurrent nausea and vomiting.  No fevers or chills.  No reported bloody or bilious emesis.  No reported diarrhea.  No chest pain or shortness of breath.  Patient noted to have been seen by PCP September with noted elevated lipase and ultrasound is just a gallstones.  Per the family, they have declined surgery in the past.  Patient had follow-up with her PCP yesterday and was recommended come to the ER because of persistent abnormal lab values.  Positive decreased ability to take p.o.  No reported alcohol or tobacco use. Presented to the ER afebrile, hemodynamically stable.  Satting well on room air.  White count 7.3, hemoglobin 11, platelets 297, creatinine 1.11, glucose 250, sodium 130.  AST 19, ALT 21, alk phos 146.  T. bili 0.8.  Abdominal ultrasound concerning for cholelithiasis without evidence of cholecystitis.  Positive thrombosis in right portal vein.  Positive infiltration at the head of pancreas concerning for acute pancreatitis-?  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 Review of Systems: As mentioned in the history of present illness. All other systems reviewed and are negative. Past Medical History:  Diagnosis Date   Anemia    Arthritis    CAD (coronary artery disease)    a. 10/2015 PCI [facility], [place], [place]): OM (2.5x30 Resolute DES), LCX (3.5x12 Resolute  DES).   Cervical cancer (HCC) 2005   Chronic systolic CHF (congestive heart failure) (HCC)    a. 01/2016 Echo: EF 20-25%, diff HK, septal-lateral dyssynchrony. Mild MR, mod to sev dil LA, nl RV, mod dil RA, mod TR, PASP .   Colon cancer (HCC) 2001   DM (diabetes mellitus) (HCC)    History of chicken pox    HLD (hyperlipidemia)    Hypertensive heart disease    Ischemic cardiomyopathy    a. 01/2016 Echo: EF 20-25%.   LBBB (left bundle branch block)    Past Surgical History:  Procedure Laterality Date   COLON RESECTION  2001   colon cancer   COLONOSCOPY  11/2011   polyp x1, int hem ([place])   CORONARY STENT PLACEMENT     TOTAL ABDOMINAL HYSTERECTOMY  2005   cervical cancer   Social History:  reports that she has never smoked. She has never used smokeless tobacco. She reports that she does not currently use alcohol. She reports that she does not use drugs.  No Known Allergies  Family History  Problem Relation Age of Onset   Colon cancer Mother    Diabetes Sister    Stomach cancer Son    Breast cancer Neg Hx     Prior to Admission medications   Medication Sig Start Date End Date Taking? Authorizing Provider  atorvastatin (LIPITOR) 40 MG tablet TAKE 1 TABLET BY MOUTH EVERY DAY 10/11/22  Yes Eustaquio Boyden, MD  carvedilol (COREG) 25 MG tablet TAKE 1 TABLET (25 MG TOTAL) BY MOUTH TWICE A DAY WITH MEALS 09/17/22  Yes Eustaquio Boyden, MD  ENTRESTO 49-51 MG TAKE 1 TABLET BY MOUTH TWICE A DAY 08/02/22  Yes Runell Gess, MD  FARXIGA 5 MG TABS tablet TAKE 1 TABLET (5 MG TOTAL) BY MOUTH DAILY. 12/28/22  Yes Eustaquio Boyden, MD  metoCLOPramide (REGLAN) 5 MG tablet Take 1 tablet (5 mg total) by mouth 3 (three) times daily as needed for nausea or vomiting (with meals). 07/13/23  Yes Eustaquio Boyden, MD  albuterol (PROVENTIL) (2.5 MG/3ML) 0.083% nebulizer solution Take 3 mLs (2.5 mg total) by nebulization 3 (three) times daily as needed for wheezing or shortness of breath.  12/08/21   Eustaquio Boyden, MD  aspirin EC 81 MG tablet Take 81 mg by mouth daily. Swallow whole.    [provider]  Cholecalciferol (VITAMIN D3) 25 MCG (1000 UT) CAPS Take 1 capsule (1,000 Units total) by mouth daily. 07/02/22   Eustaquio Boyden, MD  PROLIA 60 MG/ML SOSY injection Inject 60 mg into the skin every 6 (six) months. 01/31/23   [provider]  sertraline (ZOLOFT) 25 MG tablet TAKE 1 TABLET (25 MG TOTAL) BY MOUTH DAILY. 09/17/22   Eustaquio Boyden, MD    Physical Exam: Vitals:   07/14/23 0530 07/14/23 0600 07/14/23 0734 07/14/23 0900  BP: (!) 95/51 (!) 103/48 97/78 (!) 122/42  Pulse: 62 61 64 61  Resp: (!) 21 (!) 25 19 16   Temp:   98.4 F (36.9 C) 98.3 F (36.8 C)  TempSrc:   Oral   SpO2: 96% 98% 100% 93%  Weight:      Height:       Physical Exam Constitutional:      Appearance: She is normal weight.  HENT:     Head: Normocephalic and atraumatic.     Nose: Nose normal.     Mouth/Throat:     Mouth: Mucous membranes are dry.  Eyes:     Pupils: Pupils are equal, round, and reactive to light.  Cardiovascular:     Rate and Rhythm: Normal rate and regular rhythm.  Pulmonary:     Effort: Pulmonary effort is normal.  Abdominal:     General: Abdomen is flat. Bowel sounds are normal.  Musculoskeletal:        General: Normal range of motion.  Skin:    General: Skin is warm.  Neurological:     General: No focal deficit present.  Psychiatric:        Mood and Affect: Mood normal.     Data Reviewed:  There are no new results to review at this time.  DG Abd 2 Views CLINICAL DATA:  Abdominal pain and constipation  EXAM: ABDOMEN - 2 VIEW  COMPARISON:  CT 06/20/2023  FINDINGS: Gas and stool throughout the colon. No small or large bowel distention. No abnormal air-fluid levels. No free intra-abdominal air. No radiopaque stones. Surgical clips in the lower abdomen and pelvis. Lumbar scoliosis convex towards the left. Degenerative changes  in the spine. Soft tissue contours are normal. Lung bases are clear.  IMPRESSION: Nonobstructive bowel gas pattern.  Electronically Signed   By: Burman Nieves M.D.   On: 07/14/2023 02:38 CT ABDOMEN PELVIS W CONTRAST CLINICAL DATA:  Acute nonlocalized abdominal pain. Evaluate right branch portal vein thrombosis. Abdominal pain and not eating for the past 9 days.  EXAM: CT ABDOMEN AND PELVIS WITH CONTRAST  TECHNIQUE: Multidetector CT imaging of the abdomen and pelvis was performed using the standard protocol following bolus administration of intravenous contrast.  RADIATION DOSE REDUCTION: This exam  was performed according to the departmental dose-optimization program which includes automated exposure control, adjustment of the mA and/or kV according to patient size and/or use of iterative reconstruction technique.  CONTRAST:  75mL OMNIPAQUE IOHEXOL 300 MG/ML  SOLN  COMPARISON:  Ultrasound abdomen 07/14/2023. Abdominal radiograph 07/13/2023. CT 06/20/2023  FINDINGS: Lower chest: Emphysematous changes and scarring in the lung bases. Mild bronchiectasis.  Hepatobiliary: There is focal decreased flow in the right anterior portal vein with zone of low-attenuation consistent with portal venous thrombosis. This corresponds to ultrasound finding and is new since prior study. The remaining portal veins are patent. Cholelithiasis. No bile duct dilatation. Stone or sludge suggested in the distal common bile duct.  Pancreas: Since the previous study, there is interval development of low-attenuation change in the head of the pancreas and extending along the celiac axis around the hepatic artery. This is causing extrinsic narrowing of the hepatic artery. Given the short time frame during which this developed, it is most likely inflammatory. 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 Follow-up to resolution is recommended to exclude a rapidly developing neoplasm. The remainder of the pancreas  appears otherwise normal.  Spleen: Normal in size without focal abnormality.  Adrenals/Urinary Tract: No adrenal gland nodules. Bilateral hydronephrosis and hydroureter to the level of the bladder. No obstructing lesion is demonstrated. This could represent reflux or stasis. Bladder wall is diffusely thickened suggesting cystitis. Correlate with urinalysis.  Stomach/Bowel: Stomach, small bowel, and colon are not abnormally distended. No wall thickening or inflammatory changes. Appendix is normal.  Vascular/Lymphatic: Aortic atherosclerosis. No enlarged abdominal or pelvic lymph nodes. Surgical clips in the retroperitoneum.  Reproductive: Status post hysterectomy. No adnexal masses.  Other: No free air or free fluid in the abdomen. Abdominal wall musculature appears intact.  Musculoskeletal: Degenerative changes in the spine. No destructive bone lesions.  IMPRESSION: 1. Right anterior branch portal vein thrombosis with zone of low-attenuation in the anterior right lobe extending to the dome, likely representing hepatic infarct. This is new since prior study. 2. New development of masslike low-attenuation and infiltration around the head of the pancreas and extending to the celiac axis surrounding the hepatic artery. 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 Follow-up after resolution of acute process is recommended to exclude underlying neoplasm. 3. Cholelithiasis. Stone or sludge suggested in the distal common bile duct. No bile duct dilatation. 4. Emphysematous changes and scarring in the lung bases with mild bronchiectasis. 5. Bilateral hydronephrosis and hydroureter to the level of the bladder. No stone or obstructing lesion demonstrated. This may indicate reflux or stasis. 6. Diffuse bladder wall thickening may indicate bladder outlet obstruction or cystitis.  Correlate with urinalysis.  Electronically Signed   By: Burman Nieves M.D.   On: 07/14/2023 02:37 US ABDOMEN LIMITED RUQ (LIVER/GB) CLINICAL DATA:  Elevated liver function studies. Elevated lipase. Abdominal pain.  EXAM: ULTRASOUND ABDOMEN LIMITED RIGHT UPPER QUADRANT  COMPARISON:  CT 06/20/2023  FINDINGS: Gallbladder:  Contracted gallbladder with stones and sludge. No wall thickening or edema. Murphy's sign is negative.  Common bile duct:  Diameter: 6 mm, normal  Liver:  No focal lesion identified. Within normal limits in parenchymal echogenicity. A posterior right portal venous branch demonstrates wall thickening with absent flow on color flow Doppler imaging, suggesting thrombosis. This was not present on the previous study. Portal veins in this area appear patent on the CT. Main portal vein is patent with flow in the appropriate direction.  Other: None.  IMPRESSION: 1. Cholelithiasis without evidence of acute cholecystitis. 2. Possible thrombosis of a right portal venous branch. This was not present on previous CT and could be artifact. The main portal vein is patent with flow in the appropriate direction.  Electronically Signed   By: Burman Nieves M.D.   On: 07/14/2023 01:14  Lab Results  Component Value Date   WBC 7.3 07/13/2023   HGB 11.1 (L) 07/13/2023   HCT 34.9 (L) 07/13/2023   MCV 90.6 07/13/2023   PLT 297 07/13/2023   Last metabolic panel Lab Results  Component Value Date   GLUCOSE 248 (H) 07/13/2023   NA 130 (L) 07/13/2023   K 4.9 07/13/2023   CL 96 (L) 07/13/2023   CO2 24 07/13/2023   BUN 29 (H) 07/13/2023   CREATININE 1.11 (H) 07/13/2023   GFRNONAA 48 (L) 07/13/2023   CALCIUM 8.9 07/13/2023   PHOS 3.8 07/02/2022   PROT 7.8 07/13/2023   ALBUMIN 3.0 (L) 07/13/2023   LABGLOB 3.6 06/27/2020   AGRATIO 1.2 06/27/2020   BILITOT 0.8 07/13/2023   ALKPHOS 146 (H) 07/13/2023   AST 19 07/13/2023   ALT 21 07/13/2023   ANIONGAP 10  07/13/2023    Assessment and Plan: * Portal vein thrombosis Noted portal vein thrombosis on abdominal imaging Started on heparin drip in the ER Case preliminarily discussed with vascular surgery Will continue anticoagulation for now Follow-up vascular surgery recommendations  Upper abdominal pain Recurring abdominal pain x 3 months with imaging findings notable for portal vein thrombosis, cholelithiasis as well as a groove pancreatitis Some concern for ? CBD stone in conversation w/ Dr. Tonna Boehringer  Started on heparin drip in the ER General surgery, GI and vascular surgery consulted  IV PPI for gastritis coverage Follow-up specialist recommendations  Pancreatitis Recurrent abdominal pain with CT imaging with infiltration at the head of the pancreas concerning for groove pancreatitis versus neoplasm Lipase 600s to 300s Noted gallstones without overt cholecystitis or choledocholithiasis on imaging, though there is some potential concern for CBD stone in discussion w/ general surgery  T bili and LFTs within normal limits Pain control  IVF  Antiemetics  General surgery and GI consulted  Follow up recommendations      Cholelithiasis Cholelithiasis on abdominal ultrasound as well as CT of the abdomen pelvis with noted overlapping recurrent abdominal pain as well as groove pancreatitis No overt cholecystitis noted on imaging, though general surgery is concerned about potential CBD stone  GI also consulted  LFTs appear stable T. bili within normal limits Follow up GI and general surgery recommendations    (HFpEF) heart failure with preserved ejection fraction (HCC) 2d ECHO 08/2022 w/ EF 50-55%  Mildly dry  Monitor volume status w/ IVF hydration in setting of pancreatitis  Follow    CKD stage 3 due to type 2 diabetes mellitus (HCC) Creatinine 1.1 with GFR in the 40s Appears near baseline Monitor  ICD (implantable cardioverter-defibrillator) in place St Jude BiV ICD placed in  [place] 05/2017   CAD S/P percutaneous coronary angioplasty No active chest pain Troponin within normal limits On heparin drip in the setting of portal vein thrombosis   Hyperlipidemia associated with type 2 diabetes mellitus (HCC) Continue statin  Type 2 diabetes mellitus with peripheral neuropathy (HCC) SSI A1c   Greater than 50% was spent in counseling and coordination of care with patient Total encounter time 80 minutes or more    Advance Care Planning:   Code Status: Full Code   Consults:  GI, vascular surgery. General surgery   Family Communication: Family at the bedside   Severity of Illness: The appropriate patient status for this patient is INPATIENT. Inpatient status is judged to be reasonable and necessary in order to provide the required intensity of service to ensure the patient's safety. The patient's presenting symptoms, physical exam findings, and initial radiographic and laboratory data in the context of their chronic comorbidities is felt to place them at high risk for further clinical deterioration. Furthermore, it is not anticipated that the patient will be medically stable for discharge from the hospital within 2 midnights of admission.   * I certify that at the point of admission it is my clinical judgment that the patient will require inpatient hospital care spanning beyond 2 midnights from the point of admission due to high intensity of service, high risk for further deterioration and high frequency of surveillance required.*  Author: Floydene Flock, MD 07/14/2023 10:10 AM  For on call review [email or web address].

## 2023-07-14 NOTE — Assessment & Plan Note (Addendum)
No active chest pain Troponin within normal limits On heparin drip in the setting of portal vein thrombosis

## 2023-07-14 NOTE — Progress Notes (Signed)
PHARMACY - ANTICOAGULATION CONSULT NOTE  Pharmacy Consult for Heparin  Indication:  hepatic infarct   No Known Allergies  Patient Measurements: Height: 5' (152.4 cm) Weight: 52.8 kg (116 lb 6 oz) IBW/kg (Calculated) : 45.5 Heparin Dosing Weight: 52.  Vital Signs: Temp: 98.3 F (36.8 C) (10/24 0900) Temp Source: Oral (10/24 0734) BP: 122/42 (10/24 0900) Pulse Rate: 61 (10/24 0900)  Labs: Recent Labs    07/13/23 1253 07/13/23 1953 07/14/23 0300 07/14/23 1223  HGB 11.4* 11.1*  --   --   HCT 35.0* 34.9*  --   --   PLT 316.0 297  --   --   APTT  --   --  28  --   LABPROT  --   --  14.1  --   INR  --   --  1.1  --   HEPARINUNFRC  --   --   --  0.36  CREATININE 1.21* 1.11*  --   --   TROPONINIHS  --  5  --   --     Estimated Creatinine Clearance: 26.1 mL/min (A) (by C-G formula based on SCr of 1.11 mg/dL (H)).   Medical History: Past Medical History:  Diagnosis Date   Anemia    Arthritis    CAD (coronary artery disease)    a. 10/2015 PCI [facility], [place], [place]): OM (2.5x30 Resolute DES), LCX (3.5x12 Resolute DES).   Cervical cancer (HCC) 2005   Chronic systolic CHF (congestive heart failure) (HCC)    a. 01/2016 Echo: EF 20-25%, diff HK, septal-lateral dyssynchrony. Mild MR, mod to sev dil LA, nl RV, mod dil RA, mod TR, PASP .   Colon cancer (HCC) 2001   DM (diabetes mellitus) (HCC)    History of chicken pox    HLD (hyperlipidemia)    Hypertensive heart disease    Ischemic cardiomyopathy    a. 01/2016 Echo: EF 20-25%.   LBBB (left bundle branch block)     Medications:  Medications Prior to Admission  Medication Sig Dispense Refill Last Dose   atorvastatin (LIPITOR) 40 MG tablet TAKE 1 TABLET BY MOUTH EVERY DAY 90 tablet 2    carvedilol (COREG) 25 MG tablet TAKE 1 TABLET (25 MG TOTAL) BY MOUTH TWICE A DAY WITH MEALS 180 tablet 3    ENTRESTO 49-51 MG TAKE 1 TABLET BY MOUTH TWICE A DAY 60 tablet 12    FARXIGA 5 MG TABS tablet TAKE 1 TABLET (5 MG TOTAL) BY  MOUTH DAILY. 30 tablet 6    metoCLOPramide (REGLAN) 5 MG tablet Take 1 tablet (5 mg total) by mouth 3 (three) times daily as needed for nausea or vomiting (with meals). 30 tablet 0    albuterol (PROVENTIL) (2.5 MG/3ML) 0.083% nebulizer solution Take 3 mLs (2.5 mg total) by nebulization 3 (three) times daily as needed for wheezing or shortness of breath. 75 mL 1    aspirin EC 81 MG tablet Take 81 mg by mouth daily. Swallow whole.      Cholecalciferol (VITAMIN D3) 25 MCG (1000 UT) CAPS Take 1 capsule (1,000 Units total) by mouth daily. 30 capsule     PROLIA 60 MG/ML SOSY injection Inject 60 mg into the skin every 6 (six) months.      sertraline (ZOLOFT) 25 MG tablet TAKE 1 TABLET (25 MG TOTAL) BY MOUTH DAILY. 90 tablet 3     Assessment: Pharmacy consulted to dose heparin in this 88 yo female admitted with hepatic infarct.   No prior anticoag  noted. CrCl = 26.1 ml/min   Date/Time HL Rate  Comment 10/24 1223 0.36 900 units/hr Therapeutic  Goal of Therapy:  Heparin level 0.3-0.7 units/ml Monitor platelets by anticoagulation protocol: Yes   Plan:  Heparin level therapeutic Continue heparin infusion at a rate of 900 units/hr Check heparin level in 8 hours Continue to monitor CBC daily   Merryl Hacker, PharmD Clinical Pharmacist 07/14/2023,12:56 PM

## 2023-07-14 NOTE — Consult Note (Signed)
Subjective:   CC: Gallstone pancreatitis  HPI:  April Short is a 88 y.o. female who is consulted by [facility] for evaluation of above cc.  Symptoms were first noted few months ago.  Currently, reports no pain at time of exam.  Associated with nausea. History of elevated LFTs earlier in the year.  Downtrending with no specific concerns at that time.  Past Medical History:  has a past medical history of Anemia, Arthritis, CAD (coronary artery disease), Cervical cancer (HCC) (2005), Chronic systolic CHF (congestive heart failure) (HCC), Colon cancer (HCC) (2001), DM (diabetes mellitus) (HCC), History of chicken pox, HLD (hyperlipidemia), Hypertensive heart disease, Ischemic cardiomyopathy, and LBBB (left bundle branch block).  Past Surgical History:  has a past surgical history that includes Colon resection (2001); Total abdominal hysterectomy (2005); Coronary stent placement; and Colonoscopy (11/2011).  Family History: family history includes Colon cancer in her mother; Diabetes in her sister; Stomach cancer in her son.  Social History:  reports that she has never smoked. She has never used smokeless tobacco. She reports that she does not currently use alcohol. She reports that she does not use drugs.  Current Medications:  Prior to Admission medications   Medication Sig Start Date End Date Taking? Authorizing Provider  atorvastatin (LIPITOR) 40 MG tablet TAKE 1 TABLET BY MOUTH EVERY DAY 10/11/22  Yes Eustaquio Boyden, MD  carvedilol (COREG) 25 MG tablet TAKE 1 TABLET (25 MG TOTAL) BY MOUTH TWICE A DAY WITH MEALS 09/17/22  Yes Eustaquio Boyden, MD  ENTRESTO 49-51 MG TAKE 1 TABLET BY MOUTH TWICE A DAY 08/02/22  Yes Runell Gess, MD  FARXIGA 5 MG TABS tablet TAKE 1 TABLET (5 MG TOTAL) BY MOUTH DAILY. 12/28/22  Yes Eustaquio Boyden, MD  metoCLOPramide (REGLAN) 5 MG tablet Take 1 tablet (5 mg total) by mouth 3 (three) times daily as needed for nausea or vomiting (with meals). 07/13/23  Yes  Eustaquio Boyden, MD  albuterol (PROVENTIL) (2.5 MG/3ML) 0.083% nebulizer solution Take 3 mLs (2.5 mg total) by nebulization 3 (three) times daily as needed for wheezing or shortness of breath. 12/08/21   Eustaquio Boyden, MD  aspirin EC 81 MG tablet Take 81 mg by mouth daily. Swallow whole.    [provider]  Cholecalciferol (VITAMIN D3) 25 MCG (1000 UT) CAPS Take 1 capsule (1,000 Units total) by mouth daily. 07/02/22   Eustaquio Boyden, MD  PROLIA 60 MG/ML SOSY injection Inject 60 mg into the skin every 6 (six) months. 01/31/23   [provider]  sertraline (ZOLOFT) 25 MG tablet TAKE 1 TABLET (25 MG TOTAL) BY MOUTH DAILY. 09/17/22   Eustaquio Boyden, MD    Allergies:  Allergies as of 07/13/2023   (No Known Allergies)    ROS:  General: Denies weight loss, weight gain, fatigue, fevers, chills, and night sweats. Eyes: Denies blurry vision, double vision, eye pain, itchy eyes, and tearing. Ears: Denies hearing loss, earache, and ringing in ears. Nose: Denies sinus pain, congestion, infections, runny nose, and nosebleeds. Mouth/throat: Denies hoarseness, sore throat, bleeding gums, and difficulty swallowing. Heart: Denies chest pain, palpitations, racing heart, irregular heartbeat, leg pain or swelling, and decreased activity tolerance. Respiratory: Denies breathing difficulty, shortness of breath, wheezing, cough, and sputum. GI: Denies change in appetite, heartburn, nausea, vomiting, constipation, diarrhea, and blood in stool. GU: Denies difficulty urinating, pain with urinating, urgency, frequency, blood in urine. Musculoskeletal: Denies joint stiffness, pain, swelling, muscle weakness. Skin: Denies rash, itching, mass, tumors, sores, and boils Neurologic: Denies headache, fainting,  dizziness, seizures, numbness, and tingling. Psychiatric: Denies depression, anxiety, difficulty sleeping, and memory loss. Endocrine: Denies heat or cold intolerance, and increased thirst  or urination. Blood/lymph: Denies easy bruising, and swollen glands     Objective:     BP (!) 122/42 (BP Location: Left Arm)   Pulse 61   Temp 98.3 F (36.8 C)   Resp 16   Ht 5' (1.524 m)   Wt 52.8 kg   SpO2 93%   BMI 22.73 kg/m    Constitutional :  alert, cooperative, appears stated age, and no distress  Lymphatics/Throat:  no asymmetry, masses, or scars  Respiratory:  clear to auscultation bilaterally  Cardiovascular:  regular rate and rhythm  Gastrointestinal: soft, non-tender; bowel sounds normal; no masses,  no organomegaly.   Musculoskeletal: Steady movement  Skin: Cool and moist, midline abdominal surgical scar  Psychiatric: Normal affect, non-agitated, not confused       LABS:     Latest Ref Rng & Units 07/13/2023    7:53 PM 07/13/2023   12:53 PM 06/07/2023    9:55 AM  CMP  Glucose 70 - 99 mg/dL 098  119  147   BUN 8 - 23 mg/dL 29  26  28    Creatinine 0.44 - 1.00 mg/dL 8.29  5.62  1.30   Sodium 135 - 145 mmol/L 130  131  134   Potassium 3.5 - 5.1 mmol/L 4.9  5.5  4.7   Chloride 98 - 111 mmol/L 96  96  101   CO2 22 - 32 mmol/L 24  28  26    Calcium 8.9 - 10.3 mg/dL 8.9  9.1  9.5   Total Protein 6.5 - 8.1 g/dL 7.8  7.8  7.8   Total Bilirubin 0.3 - 1.2 mg/dL 0.8  0.6  0.6   Alkaline Phos 38 - 126 U/L 146  159  282   AST 15 - 41 U/L 19  17  738   ALT 0 - 44 U/L 21  18  598   Lipase elevated at 658-day of admission      Latest Ref Rng & Units 07/13/2023    7:53 PM 07/13/2023   12:53 PM 06/07/2023    9:55 AM  CBC  WBC 4.0 - 10.5 K/uL 7.3  8.1  4.7   Hemoglobin 12.0 - 15.0 g/dL 86.5  78.4  69.6   Hematocrit 36.0 - 46.0 % 34.9  35.0  37.3   Platelets 150 - 400 K/uL 297  316.0  175.0      RADS: CLINICAL DATA:  Acute nonlocalized abdominal pain. Evaluate right branch portal vein thrombosis. Abdominal pain and not eating for the past 9 days.   EXAM: CT ABDOMEN AND PELVIS WITH CONTRAST   TECHNIQUE: Multidetector CT imaging of the abdomen and pelvis  was performed using the standard protocol following bolus administration of intravenous contrast.   RADIATION DOSE REDUCTION: This exam was performed according to the departmental dose-optimization program which includes automated exposure control, adjustment of the mA and/or kV according to patient size and/or use of iterative reconstruction technique.   CONTRAST:  75mL OMNIPAQUE IOHEXOL 300 MG/ML  SOLN   COMPARISON:  Ultrasound abdomen 07/14/2023. Abdominal radiograph 07/13/2023. CT 06/20/2023   FINDINGS: Lower chest: Emphysematous changes and scarring in the lung bases. Mild bronchiectasis.   Hepatobiliary: There is focal decreased flow in the right anterior portal vein with zone of low-attenuation consistent with portal venous thrombosis. This corresponds to ultrasound finding and is new since prior study.  The remaining portal veins are patent. Cholelithiasis. No bile duct dilatation. Stone or sludge suggested in the distal common bile duct.   Pancreas: Since the previous study, there is interval development of low-attenuation change in the head of the pancreas and extending along the celiac axis around the hepatic artery. This is causing extrinsic narrowing of the hepatic artery. Given the short time frame during which this developed, it is most likely inflammatory. 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 Follow-up to resolution is recommended to exclude a rapidly developing neoplasm. The remainder of the pancreas appears otherwise normal.   Spleen: Normal in size without focal abnormality.   Adrenals/Urinary Tract: No adrenal gland nodules. Bilateral hydronephrosis and hydroureter to the level of the bladder. No obstructing lesion is demonstrated. This could represent reflux or stasis. Bladder wall is diffusely thickened suggesting cystitis. Correlate with urinalysis.   Stomach/Bowel: Stomach, small bowel, and colon are not abnormally distended. No wall thickening or  inflammatory changes. Appendix is normal.   Vascular/Lymphatic: Aortic atherosclerosis. No enlarged abdominal or pelvic lymph nodes. Surgical clips in the retroperitoneum.   Reproductive: Status post hysterectomy. No adnexal masses.   Other: No free air or free fluid in the abdomen. Abdominal wall musculature appears intact.   Musculoskeletal: Degenerative changes in the spine. No destructive bone lesions.   IMPRESSION: 1. Right anterior branch portal vein thrombosis with zone of low-attenuation in the anterior right lobe extending to the dome, likely representing hepatic infarct. This is new since prior study. 2. New development of masslike low-attenuation and infiltration around the head of the pancreas and extending to the celiac axis surrounding the hepatic artery. 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 Follow-up after resolution of acute process is recommended to exclude underlying neoplasm. 3. Cholelithiasis. Stone or sludge suggested in the distal common bile duct. No bile duct dilatation. 4. Emphysematous changes and scarring in the lung bases with mild bronchiectasis. 5. Bilateral hydronephrosis and hydroureter to the level of the bladder. No stone or obstructing lesion demonstrated. This may indicate reflux or stasis. 6. Diffuse bladder wall thickening may indicate bladder outlet obstruction or cystitis. Correlate with urinalysis.     Electronically Signed   By: Burman Nieves M.D.   On: 07/14/2023 02:37  CLINICAL DATA:  Elevated liver function studies. Elevated lipase. Abdominal pain.   EXAM: ULTRASOUND ABDOMEN LIMITED RIGHT UPPER QUADRANT   COMPARISON:  CT 06/20/2023   FINDINGS: Gallbladder:   Contracted gallbladder with stones and sludge. No wall thickening or edema. Murphy's sign is negative.   Common bile duct:   Diameter: 6 mm, normal    Liver:   No focal lesion identified. Within normal limits in parenchymal echogenicity. A posterior right portal venous branch demonstrates wall thickening with absent flow on color flow Doppler imaging, suggesting thrombosis. This was not present on the previous study. Portal veins in this area appear patent on the CT. Main portal vein is patent with flow in the appropriate direction.   Other: None.   IMPRESSION: 1. Cholelithiasis without evidence of acute cholecystitis. 2. Possible thrombosis of a right portal venous branch. This was not present on previous CT and could be artifact. The main portal vein is patent with flow in the appropriate direction.     Electronically Signed   By: Burman Nieves M.D.   On: 07/14/2023 01:14     Assessment:      Pancreatitis.  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  Gallstone pancreatitis cannot be completely excluded.  Plan:    Discussed with patient family members diagnoses above and possibility of proceeding with cholecystectomy in the near future, to ensure gallstones and/or sludge does not cause issues in the future.  Discussed how exact timing of such procedure will need to be determined at a later time once further workup for the possibility of persistent common bile duct obstruction is completed by GI service.  Surgery will be on standby for now.  Agree with heparin drip for now for the treatment of portal vein thrombosis.  Unclear how much of this diagnosis is contributing to her current symptomatology.  Further care per hospitalist service.  All questions addressed at this time.   labs/images/medications/previous chart entries reviewed personally and relevant changes/updates noted above.

## 2023-07-14 NOTE — Assessment & Plan Note (Addendum)
Recurrent abdominal pain with CT imaging with infiltration at the head of the pancreas concerning for groove pancreatitis versus neoplasm Lipase 600s to 300s Noted gallstones without overt cholecystitis or choledocholithiasis on imaging, though there is some potential concern for CBD stone in discussion w/ general surgery  T bili and LFTs within normal limits Pain control  IVF  Antiemetics  General surgery and GI consulted  Follow up recommendations

## 2023-07-14 NOTE — Progress Notes (Signed)
Pt placed on telemetry, has a pacer/defibrillator, is AV paced. Per family pt has had for 6 years.

## 2023-07-14 NOTE — Progress Notes (Signed)
PHARMACY - ANTICOAGULATION CONSULT NOTE  Pharmacy Consult for Heparin  Indication:  hepatic infarct   No Known Allergies  Patient Measurements: Height: 5' (152.4 cm) Weight: 52.8 kg (116 lb 6 oz) IBW/kg (Calculated) : 45.5 Heparin Dosing Weight: 52.  Vital Signs: Temp: 98 F (36.7 C) (10/24 1625) Temp Source: Oral (10/24 1625) BP: 118/55 (10/24 1625) Pulse Rate: 87 (10/24 1625)  Labs: Recent Labs    07/13/23 1253 07/13/23 1953 07/14/23 0300 07/14/23 1223 07/14/23 2130  HGB 11.4* 11.1*  --   --   --   HCT 35.0* 34.9*  --   --   --   PLT 316.0 297  --   --   --   APTT  --   --  28  --   --   LABPROT  --   --  14.1  --   --   INR  --   --  1.1  --   --   HEPARINUNFRC  --   --   --  0.36 0.43  CREATININE 1.21* 1.11*  --   --   --   TROPONINIHS  --  5  --   --   --     Estimated Creatinine Clearance: 26.1 mL/min (A) (by C-G formula based on SCr of 1.11 mg/dL (H)).   Medical History: Past Medical History:  Diagnosis Date   Anemia    Arthritis    CAD (coronary artery disease)    a. 10/2015 PCI [facility], [place], [place]): OM (2.5x30 Resolute DES), LCX (3.5x12 Resolute DES).   Cervical cancer (HCC) 2005   Chronic systolic CHF (congestive heart failure) (HCC)    a. 01/2016 Echo: EF 20-25%, diff HK, septal-lateral dyssynchrony. Mild MR, mod to sev dil LA, nl RV, mod dil RA, mod TR, PASP .   Colon cancer (HCC) 2001   DM (diabetes mellitus) (HCC)    History of chicken pox    HLD (hyperlipidemia)    Hypertensive heart disease    Ischemic cardiomyopathy    a. 01/2016 Echo: EF 20-25%.   LBBB (left bundle branch block)     Medications:  Medications Prior to Admission  Medication Sig Dispense Refill Last Dose   albuterol (PROVENTIL) (2.5 MG/3ML) 0.083% nebulizer solution Take 3 mLs (2.5 mg total) by nebulization 3 (three) times daily as needed for wheezing or shortness of breath. 75 mL 1 prn at unknown   aspirin EC 81 MG tablet Take 81 mg by mouth daily. Swallow  whole.   Past Week   atorvastatin (LIPITOR) 40 MG tablet TAKE 1 TABLET BY MOUTH EVERY DAY 90 tablet 2 Past Week   carvedilol (COREG) 25 MG tablet TAKE 1 TABLET (25 MG TOTAL) BY MOUTH TWICE A DAY WITH MEALS 180 tablet 3 Past Week   Cholecalciferol (VITAMIN D3) 25 MCG (1000 UT) CAPS Take 1 capsule (1,000 Units total) by mouth daily. 30 capsule  Past Week   ENTRESTO 49-51 MG TAKE 1 TABLET BY MOUTH TWICE A DAY 60 tablet 12 Past Week   FARXIGA 5 MG TABS tablet TAKE 1 TABLET (5 MG TOTAL) BY MOUTH DAILY. 30 tablet 6 Past Week   metoCLOPramide (REGLAN) 5 MG tablet Take 1 tablet (5 mg total) by mouth 3 (three) times daily as needed for nausea or vomiting (with meals). 30 tablet 0    PROLIA 60 MG/ML SOSY injection Inject 60 mg into the skin every 6 (six) months.      sertraline (ZOLOFT) 25 MG tablet TAKE  1 TABLET (25 MG TOTAL) BY MOUTH DAILY. (Patient not taking: Reported on 07/14/2023) 90 tablet 3 Not Taking    Assessment: Pharmacy consulted to dose heparin in this 88 yo female admitted with hepatic infarct.   No prior anticoag noted. CrCl = 26.1 ml/min   Date/Time HL Rate  Comment 10/24 1223 0.36 900 units/hr Therapeutic 10/24 2130 0.43   Goal of Therapy:  Heparin level 0.3-0.7 units/ml Monitor platelets by anticoagulation protocol: Yes   Plan:  Heparin level is therapeutic. Will continue heparin infusion at 900 units/hr. Recheck heparin level and CBC with AM labs.    Ronnald Ramp, PharmD Clinical Pharmacist 07/14/2023,10:00 PM

## 2023-07-14 NOTE — Consult Note (Signed)
April Short , MD [address], [place], [place], [address] [address], [place], [place], [address] Phone: [phone]  Fax: [phone]  Consultation  Referring Provider:    Dr Alvester Morin Primary Care Physician:  Eustaquio Boyden, MD Primary Gastroenterologist:  [facility]         Reason for Consultation:     Common bile stone  Date of Admission:  07/13/2023 Date of Consultation:  07/14/2023         HPI:   April Short is a 88 y.o. female who has past medical history of a pacemaker, CAD, heart failure pancreatitis portal vein thrombosis cholelithiasis having abdominal pain for 3 months presented with nausea vomiting.  She was admitted earlier today.  X-ray abdomen showed no obstruction of the bowel pattern right upper quadrant ultrasound demonstrated cholelithiasis with about evidence of acute cholecystitis.  Possible thrombosis of the right portal venous branch CT abdomen with contrast showed right anterior branch portal vein thrombosis new since prior study.  New development of masslike low-attenuation infiltration around the head of the pancreas extending to the celiac axis surrounding the hepatic artery the short timeframe of development suggests most likely diagnosis of acute pancreatitis possibly group pancreatitis follow-up recommended cholelithiasis with possible stone obstructing the distal common bile duct.  Bilateral hydronephrosis and hydroureter at the level of the bladder  She is being started on heparin for portal vein thrombosis . Spoke with family , patient has no pain nausea or vomiting presently . No cvomplaints.   Creatinine 1.11, lipase on admission was 658 alkaline phosphatase is 159 was 282 1 month back and 7 months back was 283 with an ALT of 1121 and AST of 575 which has been trending down since.  Bilirubin has not been elevated all along.  Past Medical History:  Diagnosis Date   Anemia    Arthritis    CAD (coronary artery disease)    a.  10/2015 [facility], [place], [place]): OM (2.5x30 Resolute DES), LCX (3.5x12 Resolute DES).   Cervical cancer (HCC) 2005   Chronic systolic CHF (congestive heart failure) (HCC)    a. 01/2016 Echo: EF 20-25%, diff HK, septal-lateral dyssynchrony. Mild MR, mod to sev dil LA, nl RV, mod dil RA, mod TR, PASP .   Colon cancer (HCC) 2001   DM (diabetes mellitus) (HCC)    History of chicken pox    HLD (hyperlipidemia)    Hypertensive heart disease    Ischemic cardiomyopathy    a. 01/2016 Echo: EF 20-25%.   LBBB (left bundle branch block)     Past Surgical History:  Procedure Laterality Date   COLON RESECTION  2001   colon cancer   COLONOSCOPY  11/2011   polyp x1, int hem ([place])   CORONARY STENT PLACEMENT     TOTAL ABDOMINAL HYSTERECTOMY  2005   cervical cancer    Prior to Admission medications   Medication Sig Start Date End Date Taking? Authorizing Provider  atorvastatin (LIPITOR) 40 MG tablet TAKE 1 TABLET BY MOUTH EVERY DAY 10/11/22  Yes Eustaquio Boyden, MD  carvedilol (COREG) 25 MG tablet TAKE 1 TABLET (25 MG TOTAL) BY MOUTH TWICE A DAY WITH MEALS 09/17/22  Yes Eustaquio Boyden, MD  ENTRESTO 49-51 MG TAKE 1 TABLET BY MOUTH TWICE A DAY 08/02/22  Yes Runell Gess, MD  FARXIGA 5 MG TABS tablet TAKE 1 TABLET (5 MG TOTAL) BY MOUTH DAILY. 12/28/22  Yes Eustaquio Boyden, MD  metoCLOPramide [facility]) 5  MG tablet Take 1 tablet (5 mg total) by mouth 3 (three) times daily as needed for nausea or vomiting (with meals). 07/13/23  Yes Eustaquio Boyden, MD  albuterol (PROVENTIL) (2.5 MG/3ML) 0.083% nebulizer solution Take 3 mLs (2.5 mg total) by nebulization 3 (three) times daily as needed for wheezing or shortness of breath. 12/08/21   Eustaquio Boyden, MD  aspirin EC 81 MG tablet Take 81 mg by mouth daily. Swallow whole.    [provider]  Cholecalciferol (VITAMIN D3) 25 MCG (1000 UT) CAPS Take 1 capsule (1,000 Units total) by mouth daily. 07/02/22   Eustaquio Boyden, MD   PROLIA 60 MG/ML SOSY injection Inject 60 mg into the skin every 6 (six) months. 01/31/23   [provider]  sertraline (ZOLOFT) 25 MG tablet TAKE 1 TABLET (25 MG TOTAL) BY MOUTH DAILY. 09/17/22   Eustaquio Boyden, MD    Family History  Problem Relation Age of Onset   Colon cancer Mother    Diabetes Sister    Stomach cancer Son    Breast cancer Neg Hx      Social History   Tobacco Use   Smoking status: Never   Smokeless tobacco: Never  Substance Use Topics   Alcohol use: Not Currently    Comment: occasionally   Drug use: No    Allergies as of 07/13/2023   (No Known Allergies)    Review of Systems:    All systems reviewed and negative except where noted in HPI.   Physical Exam:  Vital signs in last 24 hours: Temp:  [97.8 F (36.6 C)-98.7 F (37.1 C)] 98.3 F (36.8 C) (10/24 0900) Pulse Rate:  [61-77] 61 (10/24 0900) Resp:  [16-28] 16 (10/24 0900) BP: (95-122)/(41-78) 122/42 (10/24 0900) SpO2:  [93 %-100 %] 93 % (10/24 0900) Weight:  [52.8 kg] 52.8 kg (10/23 1950)   General:   Pleasant, cooperative in NAD Head:  Normocephalic and atraumatic. Eyes:   No icterus.   Conjunctiva pink. PERRLA. Ears:  Normal auditory acuity. Neck:  Supple; no masses or thyroidomegaly Lungs: Respirations even and unlabored. Lungs clear to auscultation bilaterally.   No wheezes, crackles, or rhonchi.  Heart:  Regular rate and rhythm;  Without murmur, clicks, rubs or gallops Abdomen:  Soft, nondistended, nontender. Normal bowel sounds. No appreciable masses or hepatomegaly.  No rebound or guarding.  Neurologic:  Alert and oriented x3;  grossly normal neurologically. Skin:  Intact without significant lesions or rashes. Cervical Nodes:  No significant cervical adenopathy. Psych:  Alert and cooperative. Normal affect.  LAB RESULTS: Recent Labs    07/13/23 1253 07/13/23 1953  WBC 8.1 7.3  HGB 11.4* 11.1*  HCT 35.0* 34.9*  PLT 316.0 297   BMET Recent Labs    07/13/23 1253  07/13/23 1953  NA 131* 130*  K 5.5* 4.9  CL 96 96*  CO2 28 24  GLUCOSE 303* 248*  BUN 26* 29*  CREATININE 1.21* 1.11*  CALCIUM 9.1 8.9   LFT Recent Labs    07/13/23 1953  PROT 7.8  ALBUMIN 3.0*  AST 19  ALT 21  ALKPHOS 146*  BILITOT 0.8   PT/INR Recent Labs    07/14/23 0300  LABPROT 14.1  INR 1.1    STUDIES: DG Abd 2 Views  Result Date: 07/14/2023 CLINICAL DATA:  Abdominal pain and constipation EXAM: ABDOMEN - 2 VIEW COMPARISON:  CT 06/20/2023 FINDINGS: Gas and stool throughout the colon. No small or large bowel distention. No abnormal air-fluid levels. No free intra-abdominal  air. No radiopaque stones. Surgical clips in the lower abdomen and pelvis. Lumbar scoliosis convex towards the left. Degenerative changes in the spine. Soft tissue contours are normal. Lung bases are clear. IMPRESSION: Nonobstructive bowel gas pattern. Electronically Signed   By: Burman Nieves M.D.   On: 07/14/2023 02:38   CT ABDOMEN PELVIS W CONTRAST  Result Date: 07/14/2023 CLINICAL DATA:  Acute nonlocalized abdominal pain. Evaluate right branch portal vein thrombosis. Abdominal pain and not eating for the past 9 days. EXAM: CT ABDOMEN AND PELVIS WITH CONTRAST TECHNIQUE: Multidetector CT imaging of the abdomen and pelvis was performed using the standard protocol following bolus administration of intravenous contrast. RADIATION DOSE REDUCTION: This exam was performed according to the departmental dose-optimization program which includes automated exposure control, adjustment of the mA and/or kV according to patient size and/or use of iterative reconstruction technique. CONTRAST:  75mL OMNIPAQUE IOHEXOL 300 MG/ML  SOLN COMPARISON:  Ultrasound abdomen 07/14/2023. Abdominal radiograph 07/13/2023. CT 06/20/2023 FINDINGS: Lower chest: Emphysematous changes and scarring in the lung bases. Mild bronchiectasis. Hepatobiliary: There is focal decreased flow in the right anterior portal vein with zone of  low-attenuation consistent with portal venous thrombosis. This corresponds to ultrasound finding and is new since prior study. The remaining portal veins are patent. Cholelithiasis. No bile duct dilatation. Stone or sludge suggested in the distal common bile duct. Pancreas: Since the previous study, there is interval development of low-attenuation change in the head of the pancreas and extending along the celiac axis around the hepatic artery. This is causing extrinsic narrowing of the hepatic artery. Given the short time frame during which this developed, it is most likely inflammatory. 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 Follow-up to resolution is recommended to exclude a rapidly developing neoplasm. The remainder of the pancreas appears otherwise normal. Spleen: Normal in size without focal abnormality. Adrenals/Urinary Tract: No adrenal gland nodules. Bilateral hydronephrosis and hydroureter to the level of the bladder. No obstructing lesion is demonstrated. This could represent reflux or stasis. Bladder wall is diffusely thickened suggesting cystitis. Correlate with urinalysis. Stomach/Bowel: Stomach, small bowel, and colon are not abnormally distended. No wall thickening or inflammatory changes. Appendix is normal. Vascular/Lymphatic: Aortic atherosclerosis. No enlarged abdominal or pelvic lymph nodes. Surgical clips in the retroperitoneum. Reproductive: Status post hysterectomy. No adnexal masses. Other: No free air or free fluid in the abdomen. Abdominal wall musculature appears intact. Musculoskeletal: Degenerative changes in the spine. No destructive bone lesions. IMPRESSION: 1. Right anterior branch portal vein thrombosis with zone of low-attenuation in the anterior right lobe extending to the dome, likely representing hepatic infarct. This is new since prior study. 2. New development of masslike low-attenuation and infiltration around the head of the pancreas and extending to the celiac axis surrounding the  hepatic artery. 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 Follow-up after resolution of acute process is recommended to exclude underlying neoplasm. 3. Cholelithiasis. Stone or sludge suggested in the distal common bile duct. No bile duct dilatation. 4. Emphysematous changes and scarring in the lung bases with mild bronchiectasis. 5. Bilateral hydronephrosis and hydroureter to the level of the bladder. No stone or obstructing lesion demonstrated. This may indicate reflux or stasis. 6. Diffuse bladder wall thickening may indicate bladder outlet obstruction or cystitis. Correlate with urinalysis. Electronically Signed   By: Burman Nieves M.D.   On: 07/14/2023 02:37   US ABDOMEN LIMITED RUQ (LIVER/GB)  Result Date: 07/14/2023 CLINICAL DATA:  Elevated  liver function studies. Elevated lipase. Abdominal pain. EXAM: ULTRASOUND ABDOMEN LIMITED RIGHT UPPER QUADRANT COMPARISON:  CT 06/20/2023 FINDINGS: Gallbladder: Contracted gallbladder with stones and sludge. No wall thickening or edema. Murphy's sign is negative. Common bile duct: Diameter: 6 mm, normal Liver: No focal lesion identified. Within normal limits in parenchymal echogenicity. A posterior right portal venous branch demonstrates wall thickening with absent flow on color flow Doppler imaging, suggesting thrombosis. This was not present on the previous study. Portal veins in this area appear patent on the CT. Main portal vein is patent with flow in the appropriate direction. Other: None. IMPRESSION: 1. Cholelithiasis without evidence of acute cholecystitis. 2. Possible thrombosis of a right portal venous branch. This was not present on previous CT and could be artifact. The main portal vein is patent with flow in the appropriate direction. Electronically Signed   By: Burman Nieves M.D.   On: 07/14/2023 01:14      Impression / Plan:   April Short is a 88 y.o. y/o female with a history of CAD heart failure type 2 diabetes pacemaker presents with abdominal pain.  CT scan of the abdomen shows features of pancreatitis possibly masslike lesion in the pancreatic head and new acute portal vein thrombosis.  There is concern for possible sludge or stones in the distal CBD.  LFTs were significantly elevated in the past but have improved significantly.  Differential diagnosis would be gallstone pancreatitis leading to portal vein thrombosis or a mass in the pancreatic head leading to tumor invasion portal vein thrombosis versus a combination.  It is possible that the stone has passed or it is still present.  Usually with a stone or sludge in the common bile duct it would be an indication for an ERCP, in view of her age may not be unreasonable to obtain an MRCP to confirm that there is a stone or if it has passed but she cannot have 1 due to a pacemaker.  I have discussed this with Dr. Servando Snare who does the ERCPs and options are for her to be aware of the risk of ERCP including but not limited to pancreatitis, perforation, bleeding, death versus EUS at [facility] or [facility] to confirm the presence of stones as well as rule out a mass in the head of the pancreas followed by ERCP if stones are present.discussed with family and they would like to think about the options and let me know by tomorrow . I will drop by her room tomorrow again .   Thank you for involving me in the care of this patient.      LOS: 0 days   April Mood, MD  07/14/2023, 11:06 AM

## 2023-07-14 NOTE — Consult Note (Signed)
MRN : [id number]  April Short is a 88 y.o. (September 03, 1935) female who presents with chief complaint of legs hurt and swell.  History of Present Illness:   I am asked to evaluate the patient by Dr. Alvester Morin.  Patient is an 88 year old woman who was admitted to [facility] earlier today with gallstone pancreatitis and found to have portal vein thrombosis.  She has past medical history of a pacemaker, CAD, heart failure pancreatitis portal vein thrombosis cholelithiasis having abdominal pain for 3 months presented with nausea vomiting.  She was started on heparin.  At the time of my evaluation she is feeling much better with essentially no abdominal pain and has tolerated some clear liquids.  Current Meds  Medication Sig   albuterol (PROVENTIL) (2.5 MG/3ML) 0.083% nebulizer solution Take 3 mLs (2.5 mg total) by nebulization 3 (three) times daily as needed for wheezing or shortness of breath.   aspirin EC 81 MG tablet Take 81 mg by mouth daily. Swallow whole.   atorvastatin (LIPITOR) 40 MG tablet TAKE 1 TABLET BY MOUTH EVERY DAY   carvedilol (COREG) 25 MG tablet TAKE 1 TABLET (25 MG TOTAL) BY MOUTH TWICE A DAY WITH MEALS   Cholecalciferol (VITAMIN D3) 25 MCG (1000 UT) CAPS Take 1 capsule (1,000 Units total) by mouth daily.   ENTRESTO 49-51 MG TAKE 1 TABLET BY MOUTH TWICE A DAY   FARXIGA 5 MG TABS tablet TAKE 1 TABLET (5 MG TOTAL) BY MOUTH DAILY.   metoCLOPramide (REGLAN) 5 MG tablet Take 1 tablet (5 mg total) by mouth 3 (three) times daily as needed for nausea or vomiting (with meals).    Past Medical History:  Diagnosis Date   Anemia    Arthritis    CAD (coronary artery disease)    a. 10/2015 PCI [facility], [place], [place]): OM (2.5x30 Resolute DES), LCX (3.5x12 Resolute DES).   Cervical cancer (HCC) 2005   Chronic systolic CHF (congestive heart failure) (HCC)    a. 01/2016 Echo: EF 20-25%, diff HK, septal-lateral dyssynchrony. Mild MR, mod to sev dil LA, nl  RV, mod dil RA, mod TR, PASP .   Colon cancer (HCC) 2001   DM (diabetes mellitus) (HCC)    History of chicken pox    HLD (hyperlipidemia)    Hypertensive heart disease    Ischemic cardiomyopathy    a. 01/2016 Echo: EF 20-25%.   LBBB (left bundle branch block)     Past Surgical History:  Procedure Laterality Date   COLON RESECTION  2001   colon cancer   COLONOSCOPY  11/2011   polyp x1, int hem ([place])   CORONARY STENT PLACEMENT     TOTAL ABDOMINAL HYSTERECTOMY  2005   cervical cancer    Social History Social History   Tobacco Use   Smoking status: Never   Smokeless tobacco: Never  Substance Use Topics   Alcohol use: Not Currently    Comment: occasionally   Drug use: No    Family History Family History  Problem Relation Age of Onset   Colon cancer Mother    Diabetes Sister    Stomach cancer Son    Breast cancer Neg Hx     No Known Allergies   REVIEW OF SYSTEMS (Negative unless checked)  Constitutional: [] Weight loss  [] Fever  [] Chills Cardiac: [] Chest pain   [] Chest pressure   [] Palpitations   [] Shortness of breath when laying flat   [] Shortness of breath with exertion.  Vascular:  [] Pain in legs with walking   [x] Pain in legs at rest  [] History of DVT   [] Phlebitis   [x] Swelling in legs   [] Varicose veins   [] Non-healing ulcers Pulmonary:   [] Uses home oxygen   [] Productive cough   [] Hemoptysis   [] Wheeze  [] COPD   [] Asthma Neurologic:  [] Dizziness   [] Seizures   [] History of stroke   [] History of TIA  [] Aphasia   [] Vissual changes   [] Weakness or numbness in arm   [] Weakness or numbness in leg Musculoskeletal:   [] Joint swelling   [] Joint pain   [] Low back pain Hematologic:  [] Easy bruising  [] Easy bleeding   [] Hypercoagulable state   [] Anemic Gastrointestinal:  [] Diarrhea   [] Vomiting  [] Gastroesophageal reflux/heartburn   [] Difficulty swallowing. Genitourinary:  [] Chronic kidney disease   [] Difficult urination  [] Frequent urination   [] Blood in  urine Skin:  [] Rashes   [] Ulcers  Psychological:  [] History of anxiety   []  History of major depression.  Physical Examination  Vitals:   07/14/23 0600 07/14/23 0734 07/14/23 0900 07/14/23 1625  BP: (!) 103/48 97/78 (!) 122/42 (!) 118/55  Pulse: 61 64 61 87  Resp: (!) 25 19 16 17   Temp:  98.4 F (36.9 C) 98.3 F (36.8 C) 98 F (36.7 C)  TempSrc:  Oral  Oral  SpO2: 98% 100% 93% 98%  Weight:      Height:       Body mass index is 22.73 kg/m. Gen: WD/WN, NAD Head: [place]/AT, No temporalis wasting.  Ear/Nose/Throat: Hearing grossly intact, nares w/o erythema or drainage, pinna without lesions Eyes: PER, EOMI, sclera nonicteric.  Neck: Supple, no gross masses.  No JVD.  Pulmonary:  Good air movement, no audible wheezing, no use of accessory muscles.  Cardiac: RRR, precordium not hyperdynamic. Vascular:   Vessel Right Left  Radial Palpable Palpable  Gastrointestinal: soft, non-distended. No guarding/no peritoneal signs.  Musculoskeletal: M/S 5/5 throughout.  No deformity.  Neurologic: CN 2-12 intact. Pain and light touch intact in extremities.  Symmetrical.  Speech is fluent. Motor exam as listed above. Psychiatric: Judgment intact, Mood & affect appropriate for pt's clinical situation. Dermatologic: Venous rashes no ulcers noted.  No changes consistent with cellulitis. Lymph : No lichenification or skin changes of chronic lymphedema.  CBC Lab Results  Component Value Date   WBC 7.3 07/13/2023   HGB 11.1 (L) 07/13/2023   HCT 34.9 (L) 07/13/2023   MCV 90.6 07/13/2023   PLT 297 07/13/2023    BMET    Component Value Date/Time   NA 130 (L) 07/13/2023 1953   NA 135 06/27/2020 1143   K 4.9 07/13/2023 1953   CL 96 (L) 07/13/2023 1953   CO2 24 07/13/2023 1953   GLUCOSE 248 (H) 07/13/2023 1953   BUN 29 (H) 07/13/2023 1953   BUN 25 06/27/2020 1143   CREATININE 1.11 (H) 07/13/2023 1953   CREATININE 1.23 (H) 09/14/2016 0937   CALCIUM 8.9 07/13/2023 1953   GFRNONAA 48 (L)  07/13/2023 1953   GFRNONAA 32 (L) 09/02/2016 1619   GFRAA 50 (L) 06/27/2020 1143   GFRAA 37 (L) 09/02/2016 1619   Estimated Creatinine Clearance: 26.1 mL/min (A) (by C-G formula based on SCr of 1.11 mg/dL (H)).  COAG Lab Results  Component Value Date   INR 1.1 07/14/2023    Radiology DG Abd 2 Views  Result Date: 07/14/2023 CLINICAL DATA:  Abdominal pain and constipation EXAM: ABDOMEN - 2 VIEW COMPARISON:  CT 06/20/2023 FINDINGS: Gas and stool throughout  the colon. No small or large bowel distention. No abnormal air-fluid levels. No free intra-abdominal air. No radiopaque stones. Surgical clips in the lower abdomen and pelvis. Lumbar scoliosis convex towards the left. Degenerative changes in the spine. Soft tissue contours are normal. Lung bases are clear. IMPRESSION: Nonobstructive bowel gas pattern. Electronically Signed   By: Burman Nieves M.D.   On: 07/14/2023 02:38   CT ABDOMEN PELVIS W CONTRAST  Result Date: 07/14/2023 CLINICAL DATA:  Acute nonlocalized abdominal pain. Evaluate right branch portal vein thrombosis. Abdominal pain and not eating for the past 9 days. EXAM: CT ABDOMEN AND PELVIS WITH CONTRAST TECHNIQUE: Multidetector CT imaging of the abdomen and pelvis was performed using the standard protocol following bolus administration of intravenous contrast. RADIATION DOSE REDUCTION: This exam was performed according to the departmental dose-optimization program which includes automated exposure control, adjustment of the mA and/or kV according to patient size and/or use of iterative reconstruction technique. CONTRAST:  75mL OMNIPAQUE IOHEXOL 300 MG/ML  SOLN COMPARISON:  Ultrasound abdomen 07/14/2023. Abdominal radiograph 07/13/2023. CT 06/20/2023 FINDINGS: Lower chest: Emphysematous changes and scarring in the lung bases. Mild bronchiectasis. Hepatobiliary: There is focal decreased flow in the right anterior portal vein with zone of low-attenuation consistent with portal venous  thrombosis. This corresponds to ultrasound finding and is new since prior study. The remaining portal veins are patent. Cholelithiasis. No bile duct dilatation. Stone or sludge suggested in the distal common bile duct. Pancreas: Since the previous study, there is interval development of low-attenuation change in the head of the pancreas and extending along the celiac axis around the hepatic artery. This is causing extrinsic narrowing of the hepatic artery. Given the short time frame during which this developed, it is most likely inflammatory. 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 Follow-up to resolution is recommended to exclude a rapidly developing neoplasm. The remainder of the pancreas appears otherwise normal. Spleen: Normal in size without focal abnormality. Adrenals/Urinary Tract: No adrenal gland nodules. Bilateral hydronephrosis and hydroureter to the level of the bladder. No obstructing lesion is demonstrated. This could represent reflux or stasis. Bladder wall is diffusely thickened suggesting cystitis. Correlate with urinalysis. Stomach/Bowel: Stomach, small bowel, and colon are not abnormally distended. No wall thickening or inflammatory changes. Appendix is normal. Vascular/Lymphatic: Aortic atherosclerosis. No enlarged abdominal or pelvic lymph nodes. Surgical clips in the retroperitoneum. Reproductive: Status post hysterectomy. No adnexal masses. Other: No free air or free fluid in the abdomen. Abdominal wall musculature appears intact. Musculoskeletal: Degenerative changes in the spine. No destructive bone lesions. IMPRESSION: 1. Right anterior branch portal vein thrombosis with zone of low-attenuation in the anterior right lobe extending to the dome, likely representing hepatic infarct. This is new since prior study. 2. New development of masslike low-attenuation and infiltration around the head of the pancreas and extending to the celiac axis surrounding the hepatic artery. 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 Follow-up after resolution of acute process is recommended to exclude underlying neoplasm. 3. Cholelithiasis. Stone or sludge suggested in the distal common bile duct. No bile duct dilatation. 4. Emphysematous changes and scarring in the lung bases with mild bronchiectasis. 5. Bilateral hydronephrosis and hydroureter to the level of the bladder. No stone or obstructing lesion demonstrated. This may indicate reflux or stasis. 6. Diffuse bladder wall thickening may indicate bladder outlet obstruction or cystitis. Correlate with urinalysis. Electronically Signed   By: Burman Nieves M.D.   On: 07/14/2023 02:37  US ABDOMEN LIMITED RUQ (LIVER/GB)  Result Date: 07/14/2023 CLINICAL DATA:  Elevated liver function studies. Elevated lipase. Abdominal pain. EXAM: ULTRASOUND ABDOMEN LIMITED RIGHT UPPER QUADRANT COMPARISON:  CT 06/20/2023 FINDINGS: Gallbladder: Contracted gallbladder with stones and sludge. No wall thickening or edema. Murphy's sign is negative. Common bile duct: Diameter: 6 mm, normal Liver: No focal lesion identified. Within normal limits in parenchymal echogenicity. A posterior right portal venous branch demonstrates wall thickening with absent flow on color flow Doppler imaging, suggesting thrombosis. This was not present on the previous study. Portal veins in this area appear patent on the CT. Main portal vein is patent with flow in the appropriate direction. Other: None. IMPRESSION: 1. Cholelithiasis without evidence of acute cholecystitis. 2. Possible thrombosis of a right portal venous branch. This was not present on previous CT and could be artifact. The main portal vein is patent with flow in the appropriate direction. Electronically Signed   By: Burman Nieves M.D.   On: 07/14/2023 01:14   CT ABDOMEN PELVIS W CONTRAST  Result Date: 07/09/2023 CLINICAL DATA:   Cholelithiasis, colon cancer, abnormal liver enzymes. EXAM: CT ABDOMEN AND PELVIS WITH CONTRAST TECHNIQUE: Multidetector CT imaging of the abdomen and pelvis was performed using the standard protocol following bolus administration of intravenous contrast. RADIATION DOSE REDUCTION: This exam was performed according to the departmental dose-optimization program which includes automated exposure control, adjustment of the mA and/or kV according to patient size and/or use of iterative reconstruction technique. CONTRAST:  Oral contrast and 75mL ISOVUE-300 IOPAMIDOL (ISOVUE-300) INJECTION 61% COMPARISON:  05/03/2016. FINDINGS: Lower chest: Clear. No pleural or pericardial effusion. There are pacer wires. Atheromatous changes of the aorta and coronary arteries. Hepatobiliary: No focal liver abnormality is seen. No gallstones, gallbladder wall thickening, or biliary dilatation. Pancreas: Unremarkable. No pancreatic ductal dilatation or surrounding inflammatory changes. Spleen: Normal in size without focal abnormality. Adrenals/Urinary Tract: Adrenal glands are unremarkable. Kidneys are normal, without renal calculi, focal lesion, or hydronephrosis. Bladder is unremarkable. Stomach/Bowel: Stomach is within normal limits. Appendix appears normal. No evidence of bowel wall thickening, distention, or inflammatory changes. Sigmoid colon anastomosis noted. No evidence of obstruction. Oral contrast progressed to the rectum. Vascular/Lymphatic: Aortic atherosclerosis. No enlarged abdominal or pelvic lymph nodes. Reproductive: Status post hysterectomy. No adnexal masses. Other: No abdominal wall hernia or abnormality. No abdominopelvic ascites. Musculoskeletal: Thoracolumbosacral degenerative changes. No acute osseous abnormalities. IMPRESSION: Unremarkable examination abdomen and pelvis. Electronically Signed   By: Layla Maw M.D.   On: 07/09/2023 10:12   CUP PACEART REMOTE DEVICE CHECK  Result Date: 06/27/2023 Scheduled  remote reviewed. Normal device function.  Next remote 91 days. LA, CVRS    Assessment/Plan 1.  Portal vein thrombosis: Patient is initiated on a heparin drip and this is the primary treatment.  There is no role for open portal vein thrombectomy.  Furthermore, transhepatic thrombolysis has not been documented to improve outcome.  More importantly treatment of the underlying cause of the portal vein thrombosis in this case the gallstone pancreatitis is the primary therapy.  She will continue on anticoagulation for 6 months.  Oral anticoagulation can be initiated once treatment of her biliary gallstone disease has been finalized.  2.  Gallstone pancreatitis: I spent the majority of my our long discussion with the patient and family including the patient's daughter as well as her niece in reviewing images demonstrating biliary disease and gallstone pancreatitis.  We discussed at length the role of ERCP as well as the role of MR studies.  They had  numerous questions regarding the reason to transport to [facility].  They did not understand why this was indicated with respect to an MRI study.  I explained that given her pacemaker defibrillator we do not have an MRI that is compatible and that transfer to [facility] does allow for this study to be performed.  We also discussed extensively the indications for ERCP and how it can be both diagnostic as well as therapeutic.  I answered his many of the questions with the family had his I could and have deferred the decision regarding transferred to Dr. Tonna Boehringer.  3.  Coronary artery disease: Continue cardiac and antihypertensive medications as already ordered and reviewed, no changes at this time.  Continue statin as ordered and reviewed, no changes at this time  Nitrates PRN for chest pain  4.  Diabetes mellitus: Continue hypoglycemic medications as already ordered, these medications have been reviewed and there are no changes at this time.  Hgb A1C  to be monitored as already arranged by primary service     Levora Dredge, MD  07/14/2023 10:30 PM

## 2023-07-14 NOTE — Assessment & Plan Note (Signed)
Noted portal vein thrombosis on abdominal imaging Started on heparin drip in the ER Case preliminarily discussed with vascular surgery Will continue anticoagulation for now Follow-up vascular surgery recommendations

## 2023-07-14 NOTE — Assessment & Plan Note (Signed)
2d ECHO 08/2022 w/ EF 50-55%  Mildly dry  Monitor volume status w/ IVF hydration in setting of pancreatitis  Follow

## 2023-07-14 NOTE — Assessment & Plan Note (Signed)
Continue statin. 

## 2023-07-14 NOTE — Inpatient Diabetes Management (Signed)
Inpatient Diabetes Program Recommendations  AACE/ADA: New Consensus Statement on Inpatient Glycemic Control (2015)  Target Ranges:  Prepandial:   less than 140 mg/dL      Peak postprandial:   less than 180 mg/dL (1-2 hours)      Critically ill patients:  140 - 180 mg/dL    Latest Reference Range & Units 07/13/23 12:53 07/13/23 19:53  Glucose 70 - 99 mg/dL 161 (H) 096 (H)  (H): Data is abnormally high    Admit with: Upper abdominal pain and decreased oral intake for the past 9 days   History: DM2  Home DM Meds: Farxiga 5 mg daily  Current Orders: None yet    MD- Please consider starting Novolog Sensitive Correction Scale/ SSI (0-9 units) Q4 hours    --Will follow patient during hospitalization--  Ambrose Finland RN, MSN, CDCES Diabetes Coordinator Inpatient Glycemic Control Team Team Pager: [phone] (8a-5p)

## 2023-07-14 NOTE — Assessment & Plan Note (Addendum)
Recurring abdominal pain x 3 months with imaging findings notable for portal vein thrombosis, cholelithiasis as well as a groove pancreatitis Some concern for ? CBD stone in conversation w/ Dr. Tonna Boehringer  Started on heparin drip in the ER General surgery, GI and vascular surgery consulted  IV PPI for gastritis coverage Follow-up specialist recommendations

## 2023-07-14 NOTE — Assessment & Plan Note (Signed)
[employer] Jude BiV ICD placed in [place] 05/2017

## 2023-07-14 NOTE — Progress Notes (Signed)
PHARMACY - ANTICOAGULATION CONSULT NOTE  Pharmacy Consult for Heparin  Indication:  hepatic infarct   No Known Allergies  Patient Measurements: Height: 5' (152.4 cm) Weight: 52.8 kg (116 lb 6 oz) IBW/kg (Calculated) : 45.5 Heparin Dosing Weight: 52.  Vital Signs: Temp: 98 F (36.7 C) (10/24 0207) Temp Source: Oral (10/24 0207) BP: 119/47 (10/24 0130) Pulse Rate: 72 (10/24 0130)  Labs: Recent Labs    07/13/23 1253 07/13/23 1953  HGB 11.4* 11.1*  HCT 35.0* 34.9*  PLT 316.0 297  CREATININE 1.21* 1.11*  TROPONINIHS  --  5    Estimated Creatinine Clearance: 26.1 mL/min (A) (by C-G formula based on SCr of 1.11 mg/dL (H)).   Medical History: Past Medical History:  Diagnosis Date   Anemia    Arthritis    CAD (coronary artery disease)    a. 10/2015 PCI [facility], [place], [place]): OM (2.5x30 Resolute DES), LCX (3.5x12 Resolute DES).   Cervical cancer (HCC) 2005   Chronic systolic CHF (congestive heart failure) (HCC)    a. 01/2016 Echo: EF 20-25%, diff HK, septal-lateral dyssynchrony. Mild MR, mod to sev dil LA, nl RV, mod dil RA, mod TR, PASP .   Colon cancer (HCC) 2001   DM (diabetes mellitus) (HCC)    History of chicken pox    HLD (hyperlipidemia)    Hypertensive heart disease    Ischemic cardiomyopathy    a. 01/2016 Echo: EF 20-25%.   LBBB (left bundle branch block)     Medications:  (Not in a hospital admission)   Assessment: Pharmacy consulted to dose heparin in this 88 yo female admitted with hepatic infarct.   No prior anticoag noted. CrCl = 26.1 ml/min   Goal of Therapy:  Heparin level 0.3-0.7 units/ml Monitor platelets by anticoagulation protocol: Yes   Plan:  Give 3200 units bolus x 1 Start heparin infusion at 900 units/hr Check anti-Xa level in 8 hours and daily while on heparin Continue to monitor H&H and platelets  Graesyn Schreifels D 07/14/2023,2:54 AM

## 2023-07-14 NOTE — Assessment & Plan Note (Signed)
Creatinine 1.1 with GFR in the 40s Appears near baseline Monitor

## 2023-07-14 NOTE — Assessment & Plan Note (Addendum)
Cholelithiasis on abdominal ultrasound as well as CT of the abdomen pelvis with noted overlapping recurrent abdominal pain as well as groove pancreatitis No overt cholecystitis noted on imaging, though general surgery is concerned about potential CBD stone  GI also consulted  LFTs appear stable T. bili within normal limits Follow up GI and general surgery recommendations

## 2023-07-15 ENCOUNTER — Inpatient Hospital Stay: Payer: 59

## 2023-07-15 ENCOUNTER — Inpatient Hospital Stay: Payer: 59 | Admitting: Certified Registered"

## 2023-07-15 ENCOUNTER — Encounter
Admission: EM | Disposition: A | Discharge status: Other Acute Inpatient Hospital | Payer: Self-pay | Source: Ambulatory Visit | Attending: Internal Medicine

## 2023-07-15 ENCOUNTER — Other Ambulatory Visit: Payer: Self-pay

## 2023-07-15 ENCOUNTER — Ambulatory Visit: Payer: 59 | Admitting: Podiatry

## 2023-07-15 DIAGNOSIS — Z9861 Coronary angioplasty status: Secondary | ICD-10-CM

## 2023-07-15 DIAGNOSIS — I5032 Chronic diastolic (congestive) heart failure: Secondary | ICD-10-CM

## 2023-07-15 DIAGNOSIS — E1142 Type 2 diabetes mellitus with diabetic polyneuropathy: Secondary | ICD-10-CM

## 2023-07-15 DIAGNOSIS — K8021 Calculus of gallbladder without cholecystitis with obstruction: Secondary | ICD-10-CM | POA: Diagnosis not present

## 2023-07-15 DIAGNOSIS — K851 Biliary acute pancreatitis without necrosis or infection: Secondary | ICD-10-CM

## 2023-07-15 DIAGNOSIS — N183 Chronic kidney disease, stage 3 unspecified: Secondary | ICD-10-CM

## 2023-07-15 DIAGNOSIS — E1122 Type 2 diabetes mellitus with diabetic chronic kidney disease: Secondary | ICD-10-CM

## 2023-07-15 DIAGNOSIS — I81 Portal vein thrombosis: Secondary | ICD-10-CM | POA: Diagnosis not present

## 2023-07-15 DIAGNOSIS — I2510 Atherosclerotic heart disease of native coronary artery without angina pectoris: Secondary | ICD-10-CM | POA: Diagnosis not present

## 2023-07-15 DIAGNOSIS — E785 Hyperlipidemia, unspecified: Secondary | ICD-10-CM

## 2023-07-15 DIAGNOSIS — Z95810 Presence of automatic (implantable) cardiac defibrillator: Secondary | ICD-10-CM

## 2023-07-15 HISTORY — PX: INTRAOPERATIVE CHOLANGIOGRAM: SHX5230

## 2023-07-15 HISTORY — PX: ROBOTIC ASSISTED LAPAROSCOPIC CHOLECYSTECTOMY: SHX6521

## 2023-07-15 LAB — GLUCOSE, CAPILLARY
Glucose-Capillary: 181 mg/dL — ABNORMAL HIGH (ref 70–99)
Glucose-Capillary: 188 mg/dL — ABNORMAL HIGH (ref 70–99)
Glucose-Capillary: 71 mg/dL (ref 70–99)
Glucose-Capillary: 72 mg/dL (ref 70–99)
Glucose-Capillary: 75 mg/dL (ref 70–99)
Glucose-Capillary: 90 mg/dL (ref 70–99)

## 2023-07-15 LAB — CBC
HCT: 30.7 % — ABNORMAL LOW (ref 36.0–46.0)
Hemoglobin: 9.8 g/dL — ABNORMAL LOW (ref 12.0–15.0)
MCH: 28.8 pg (ref 26.0–34.0)
MCHC: 31.9 g/dL (ref 30.0–36.0)
MCV: 90.3 fL (ref 80.0–100.0)
Platelets: 270 K/uL (ref 150–400)
RBC: 3.4 MIL/uL — ABNORMAL LOW (ref 3.87–5.11)
RDW: 14 % (ref 11.5–15.5)
WBC: 7.1 K/uL (ref 4.0–10.5)
nRBC: 0 % (ref 0.0–0.2)

## 2023-07-15 LAB — COMPREHENSIVE METABOLIC PANEL WITH GFR
ALT: 17 U/L (ref 0–44)
AST: 18 U/L (ref 15–41)
Albumin: 2.5 g/dL — ABNORMAL LOW (ref 3.5–5.0)
Alkaline Phosphatase: 114 U/L (ref 38–126)
Anion gap: 6 (ref 5–15)
BUN: 16 mg/dL (ref 8–23)
CO2: 24 mmol/L (ref 22–32)
Calcium: 7.8 mg/dL — ABNORMAL LOW (ref 8.9–10.3)
Chloride: 107 mmol/L (ref 98–111)
Creatinine, Ser: 0.97 mg/dL (ref 0.44–1.00)
GFR, Estimated: 57 mL/min — ABNORMAL LOW (ref >60)
Glucose, Bld: 79 mg/dL (ref 70–99)
Potassium: 4.1 mmol/L (ref 3.5–5.1)
Sodium: 137 mmol/L (ref 135–145)
Total Bilirubin: 0.7 mg/dL (ref 0.3–1.2)
Total Protein: 6.6 g/dL (ref 6.5–8.1)

## 2023-07-15 LAB — URINE CULTURE
MICRO NUMBER:: 15632946
SPECIMEN QUALITY:: ADEQUATE

## 2023-07-15 LAB — HEPARIN LEVEL (UNFRACTIONATED): Heparin Unfractionated: 0.4 [IU]/mL (ref 0.30–0.70)

## 2023-07-15 SURGERY — CHOLECYSTECTOMY, ROBOT-ASSISTED, LAPAROSCOPIC
Anesthesia: General

## 2023-07-15 MED ORDER — PROPOFOL 10 MG/ML IV BOLUS
INTRAVENOUS | Status: DC | PRN
  Administered 2023-07-15: 80 mg via INTRAVENOUS

## 2023-07-15 MED ORDER — IOHEXOL 180 MG/ML  SOLN
INTRAMUSCULAR | Status: DC | PRN
  Administered 2023-07-15: 10 mL

## 2023-07-15 MED ORDER — 0.9 % SODIUM CHLORIDE (POUR BTL) OPTIME
TOPICAL | Status: DC | PRN
  Administered 2023-07-15: 500 mL

## 2023-07-15 MED ORDER — EPHEDRINE SULFATE-NACL 50-0.9 MG/10ML-% IV SOSY
PREFILLED_SYRINGE | INTRAVENOUS | Status: DC | PRN
  Administered 2023-07-15: 5 mg via INTRAVENOUS

## 2023-07-15 MED ORDER — ONDANSETRON HCL 4 MG/2ML IJ SOLN
INTRAMUSCULAR | Status: DC | PRN
  Administered 2023-07-15: 4 mg via INTRAVENOUS

## 2023-07-15 MED ORDER — LIDOCAINE HCL (PF) 1 % IJ SOLN
INTRAMUSCULAR | Status: DC | PRN
  Administered 2023-07-15: 50 mL

## 2023-07-15 MED ORDER — BUPIVACAINE-EPINEPHRINE (PF) 0.5% -1:200000 IJ SOLN
INTRAMUSCULAR | Status: AC
  Filled 2023-07-15: qty 30

## 2023-07-15 MED ORDER — SUGAMMADEX SODIUM 200 MG/2ML IV SOLN
INTRAVENOUS | Status: DC | PRN
  Administered 2023-07-15: 211.2 mg via INTRAVENOUS

## 2023-07-15 MED ORDER — PHENYLEPHRINE HCL-NACL 20-0.9 MG/250ML-% IV SOLN
INTRAVENOUS | Status: DC | PRN
  Administered 2023-07-15: 15 ug/min via INTRAVENOUS

## 2023-07-15 MED ORDER — PHENYLEPHRINE 80 MCG/ML (10ML) SYRINGE FOR IV PUSH (FOR BLOOD PRESSURE SUPPORT)
PREFILLED_SYRINGE | INTRAVENOUS | Status: DC | PRN
  Administered 2023-07-15 (×2): 160 ug via INTRAVENOUS
  Administered 2023-07-15: 80 ug via INTRAVENOUS
  Administered 2023-07-15 (×2): 160 ug via INTRAVENOUS
  Administered 2023-07-15: 80 ug via INTRAVENOUS

## 2023-07-15 MED ORDER — INDOCYANINE GREEN 25 MG IV SOLR
1.2500 mg | Freq: Once | INTRAVENOUS | Status: AC
  Administered 2023-07-15: 1.25 mg via INTRAVENOUS

## 2023-07-15 MED ORDER — FENTANYL CITRATE (PF) 100 MCG/2ML IJ SOLN
INTRAMUSCULAR | Status: DC | PRN
  Administered 2023-07-15: 50 ug via INTRAVENOUS

## 2023-07-15 MED ORDER — ACETAMINOPHEN 10 MG/ML IV SOLN
INTRAVENOUS | Status: DC | PRN
  Administered 2023-07-15: 1000 mg via INTRAVENOUS

## 2023-07-15 MED ORDER — ACETAMINOPHEN 500 MG PO TABS
1000.0000 mg | ORAL_TABLET | Freq: Four times a day (QID) | ORAL | Status: DC
  Filled 2023-07-15 (×2): qty 2

## 2023-07-15 MED ORDER — CEFAZOLIN SODIUM-DEXTROSE 2-4 GM/100ML-% IV SOLN
INTRAVENOUS | Status: AC
  Filled 2023-07-15: qty 100

## 2023-07-15 MED ORDER — OXYCODONE HCL 5 MG/5ML PO SOLN
5.0000 mg | Freq: Once | ORAL | Status: DC | PRN
Start: 2023-07-15 — End: 2023-07-15

## 2023-07-15 MED ORDER — GLYCOPYRROLATE 0.2 MG/ML IJ SOLN
INTRAMUSCULAR | Status: DC | PRN
  Administered 2023-07-15: .2 mg via INTRAVENOUS

## 2023-07-15 MED ORDER — FENTANYL CITRATE (PF) 100 MCG/2ML IJ SOLN: 25.0000 ug | INTRAMUSCULAR | Status: DC | PRN

## 2023-07-15 MED ORDER — OXYCODONE HCL 5 MG PO TABS: 5.0000 mg | ORAL_TABLET | Freq: Once | ORAL | Status: DC | PRN

## 2023-07-15 MED ORDER — OXYCODONE HCL 5 MG PO TABS: 5.0000 mg | ORAL_TABLET | ORAL | Status: DC | PRN

## 2023-07-15 MED ORDER — LIDOCAINE HCL (PF) 1 % IJ SOLN
INTRAMUSCULAR | Status: AC
  Filled 2023-07-15: qty 30

## 2023-07-15 MED ORDER — INDOCYANINE GREEN 25 MG IV SOLR
INTRAVENOUS | Status: AC
  Filled 2023-07-15: qty 10

## 2023-07-15 MED ORDER — ROCURONIUM BROMIDE 100 MG/10ML IV SOLN
INTRAVENOUS | Status: DC | PRN
  Administered 2023-07-15: 10 mg via INTRAVENOUS
  Administered 2023-07-15: 30 mg via INTRAVENOUS
  Administered 2023-07-15: 20 mg via INTRAVENOUS
  Administered 2023-07-15: 10 mg via INTRAVENOUS

## 2023-07-15 MED ORDER — LACTATED RINGERS IV SOLN: INTRAVENOUS | Status: DC | PRN

## 2023-07-15 MED ORDER — CEFAZOLIN SODIUM-DEXTROSE 2-4 GM/100ML-% IV SOLN
2.0000 g | Freq: Once | INTRAVENOUS | Status: AC
  Administered 2023-07-15: 2 g via INTRAVENOUS

## 2023-07-15 MED ORDER — SODIUM CHLORIDE (PF) 0.9 % IJ SOLN
INTRAMUSCULAR | Status: AC
  Filled 2023-07-15: qty 100

## 2023-07-15 MED ORDER — TRAMADOL HCL 50 MG PO TABS: 50.0000 mg | ORAL_TABLET | Freq: Four times a day (QID) | ORAL | Status: DC | PRN

## 2023-07-15 MED ORDER — ACETAMINOPHEN 10 MG/ML IV SOLN
INTRAVENOUS | Status: AC
  Filled 2023-07-15: qty 100

## 2023-07-15 MED ORDER — FENTANYL CITRATE (PF) 100 MCG/2ML IJ SOLN
INTRAMUSCULAR | Status: AC
  Filled 2023-07-15: qty 2

## 2023-07-15 MED ORDER — HYDROMORPHONE HCL 1 MG/ML IJ SOLN
0.5000 mg | INTRAMUSCULAR | Status: DC | PRN
  Administered 2023-07-16: 0.5 mg via INTRAVENOUS
  Filled 2023-07-15: qty 0.5

## 2023-07-15 MED ORDER — DEXAMETHASONE SODIUM PHOSPHATE 10 MG/ML IJ SOLN
INTRAMUSCULAR | Status: DC | PRN
  Administered 2023-07-15: 10 mg via INTRAVENOUS

## 2023-07-15 MED ORDER — LIDOCAINE HCL (CARDIAC) PF 100 MG/5ML IV SOSY
PREFILLED_SYRINGE | INTRAVENOUS | Status: DC | PRN
  Administered 2023-07-15: 60 mg via INTRAVENOUS

## 2023-07-15 SURGICAL SUPPLY — 66 items
ADH SKN CLS APL DERMABOND .7 (GAUZE/BANDAGES/DRESSINGS) ×1
ANCHOR TIS RET SYS 235ML (MISCELLANEOUS) ×1 IMPLANT
BAG PRESSURE INF REUSE 1000 (BAG) IMPLANT
BAG TISS RTRVL C235 10X14 (MISCELLANEOUS) ×1
BLADE SURG SZ11 CARB STEEL (BLADE) ×1 IMPLANT
CATH REDDICK CHOLANGI 4FR 50CM (CATHETERS) IMPLANT
CAUTERY HOOK MNPLR 1.6 DVNC XI (INSTRUMENTS) ×1 IMPLANT
CLIP LIGATING HEMO O LOK GREEN (MISCELLANEOUS) ×1 IMPLANT
DERMABOND ADVANCED .7 DNX12 (GAUZE/BANDAGES/DRESSINGS) ×1 IMPLANT
DRAIN CHANNEL JP 15F RND 16 (MISCELLANEOUS) IMPLANT
DRAPE ARM DVNC X/XI (DISPOSABLE) ×4 IMPLANT
DRAPE C-ARM XRAY 36X54 (DRAPES) IMPLANT
DRAPE COLUMN DVNC XI (DISPOSABLE) ×1 IMPLANT
DRIVER NDL MEGA SUTCUT DVNCXI (INSTRUMENTS) IMPLANT
DRIVER NDLE MEGA SUTCUT DVNCXI (INSTRUMENTS) ×1 IMPLANT
ELECT CAUTERY BLADE 6.4 (BLADE) ×1 IMPLANT
ELECT REM PT RETURN 9FT ADLT (ELECTROSURGICAL) ×1
ELECTRODE REM PT RTRN 9FT ADLT (ELECTROSURGICAL) ×1 IMPLANT
EVACUATOR SILICONE 100CC (DRAIN) IMPLANT
FORCEPS BPLR FENES DVNC XI (FORCEP) ×1 IMPLANT
FORCEPS PROGRASP DVNC XI (FORCEP) ×1 IMPLANT
GLOVE BIOGEL PI IND STRL 7.0 (GLOVE) ×2 IMPLANT
GLOVE SURG SYN 6.5 ES PF (GLOVE) ×3 IMPLANT
GLOVE SURG SYN 6.5 PF PI (GLOVE) ×2 IMPLANT
GOWN STRL REUS W/ TWL LRG LVL3 (GOWN DISPOSABLE) ×3 IMPLANT
GOWN STRL REUS W/TWL LRG LVL3 (GOWN DISPOSABLE) ×3
GRASPER SUT TROCAR 14GX15 (MISCELLANEOUS) IMPLANT
HANDLE YANKAUER SUCT OPEN TIP (MISCELLANEOUS) IMPLANT
IRRIGATOR SUCT 8 DISP DVNC XI (IRRIGATION / IRRIGATOR) IMPLANT
IV CATH ANGIO 12GX3 LT BLUE (NEEDLE) IMPLANT
IV NS 1000ML (IV SOLUTION)
IV NS 1000ML BAXH (IV SOLUTION) IMPLANT
KIT TURNOVER KIT A (KITS) ×1 IMPLANT
LABEL OR SOLS (LABEL) ×1 IMPLANT
MANIFOLD NEPTUNE II (INSTRUMENTS) ×1 IMPLANT
NDL HYPO 22X1.5 SAFETY MO (MISCELLANEOUS) ×1 IMPLANT
NDL INSUFFLATION 14GA 120MM (NEEDLE) ×1 IMPLANT
NEEDLE HYPO 22X1.5 SAFETY MO (MISCELLANEOUS) ×1 IMPLANT
NEEDLE INSUFFLATION 14GA 120MM (NEEDLE) ×1 IMPLANT
NS IRRIG 500ML POUR BTL (IV SOLUTION) ×1 IMPLANT
OBTURATOR OPTICAL STND 8 DVNC (TROCAR) ×1
OBTURATOR OPTICALSTD 8 DVNC (TROCAR) ×1 IMPLANT
PACK LAP CHOLECYSTECTOMY (MISCELLANEOUS) ×1 IMPLANT
PENCIL SMOKE EVACUATOR (MISCELLANEOUS) ×1 IMPLANT
SCISSORS METZENBAUM CVD 33 (INSTRUMENTS) IMPLANT
SEAL UNIV 5-12 XI (MISCELLANEOUS) ×4 IMPLANT
SET TUBE SMOKE EVAC HIGH FLOW (TUBING) ×1 IMPLANT
SLEEVE Z-THREAD 5X100MM (TROCAR) IMPLANT
SOL ELECTROSURG ANTI STICK (MISCELLANEOUS) ×1
SOLUTION ELECTROSURG ANTI STCK (MISCELLANEOUS) ×1 IMPLANT
SPIKE FLUID TRANSFER (MISCELLANEOUS) ×2 IMPLANT
SPONGE DRAIN TRACH 4X4 STRL 2S (GAUZE/BANDAGES/DRESSINGS) IMPLANT
SUT ETHILON 3-0 FS-10 30 BLK (SUTURE) ×2
SUT MNCRL 4-0 (SUTURE) ×2
SUT MNCRL 4-0 27XMFL (SUTURE) ×2
SUT SILK 3 0 SH 30 (SUTURE) IMPLANT
SUT SILK 3 0 SH CR/8 (SUTURE) IMPLANT
SUT VICRYL 0 AB UR-6 (SUTURE) IMPLANT
SUT VICRYL 0 UR6 27IN ABS (SUTURE) ×1 IMPLANT
SUTURE EHLN 3-0 FS-10 30 BLK (SUTURE) IMPLANT
SUTURE MNCRL 4-0 27XMF (SUTURE) ×2 IMPLANT
SYR 30ML LL (SYRINGE) IMPLANT
SYSTEM WECK SHIELD CLOSURE (TROCAR) IMPLANT
TAPE TRANSPORE STRL 2 31045 (GAUZE/BANDAGES/DRESSINGS) IMPLANT
TRAP FLUID SMOKE EVACUATOR (MISCELLANEOUS) ×1 IMPLANT
WATER STERILE IRR 500ML POUR (IV SOLUTION) ×1 IMPLANT

## 2023-07-15 NOTE — Anesthesia Preprocedure Evaluation (Addendum)
Anesthesia Evaluation  Patient identified by MRN, date of birth, ID band Patient awake    Reviewed: Allergy & Precautions, NPO status , Patient's Chart, lab work & pertinent test results  History of Anesthesia Complications Negative for: history of anesthetic complications  Airway Mallampati: III  TM Distance: >3 FB Neck ROM: full    Dental  (+) Edentulous Upper, Edentulous Lower   Pulmonary neg pulmonary ROS   Pulmonary exam normal        Cardiovascular + CAD  Normal cardiovascular exam+ Cardiac Defibrillator      Neuro/Psych  Neuromuscular disease  negative psych ROS   GI/Hepatic negative GI ROS, Neg liver ROS,,,  Endo/Other  diabetes    Renal/GU Renal disease     Musculoskeletal   Abdominal   Peds  Hematology negative hematology ROS (+)   Anesthesia Other Findings Past Medical History: No date: Anemia No date: Arthritis No date: CAD (coronary artery disease)     Comment:  a. 10/2015 PCI [facility], [place], [place]): OM (2.5x30               Resolute DES), LCX (3.5x12 Resolute DES). 2005: Cervical cancer (HCC) No date: Chronic systolic CHF (congestive heart failure) (HCC)     Comment:  a. 01/2016 Echo: EF 20-25%, diff HK, septal-lateral               dyssynchrony. Mild MR, mod to sev dil LA, nl RV, mod dil               RA, mod TR, PASP . 2001: Colon cancer (HCC) No date: DM (diabetes mellitus) (HCC) No date: History of chicken pox No date: HLD (hyperlipidemia) No date: Hypertensive heart disease No date: Ischemic cardiomyopathy     Comment:  a. 01/2016 Echo: EF 20-25%. No date: LBBB (left bundle branch block)  Past Surgical History: 2001: COLON RESECTION     Comment:  colon cancer 11/2011: COLONOSCOPY     Comment:  polyp x1, int hem ([place]) No date: CORONARY STENT PLACEMENT 2005: TOTAL ABDOMINAL HYSTERECTOMY     Comment:  cervical cancer  BMI    Body Mass Index: 22.73 kg/m       Reproductive/Obstetrics negative OB ROS                             Anesthesia Physical Anesthesia Plan  ASA: 3  Anesthesia Plan: General ETT   Post-op Pain Management: Ofirmev IV (intra-op)*   Induction: Intravenous  PONV Risk Score and Plan: 4 or greater and Ondansetron, Dexamethasone and Treatment may vary due to age or medical condition  Airway Management Planned: Oral ETT  Additional Equipment:   Intra-op Plan:   Post-operative Plan: Extubation in OR  Informed Consent: I have reviewed the patients History and Physical, chart, labs and discussed the procedure including the risks, benefits and alternatives for the proposed anesthesia with the patient or authorized representative who has indicated his/her understanding and acceptance.     Dental Advisory Given and Interpreter used for interveiw  Plan Discussed with: Anesthesiologist, CRNA and Surgeon  Anesthesia Plan Comments: (Patient consented for risks of anesthesia including but not limited to:  - adverse reactions to medications - damage to eyes, teeth, lips or other oral mucosa - nerve damage due to positioning  - sore throat or hoarseness - Damage to heart, brain, nerves, lungs, other parts of body or loss of life  Patient voiced understanding and assent.)  Anesthesia Quick Evaluation

## 2023-07-15 NOTE — Plan of Care (Signed)

## 2023-07-15 NOTE — Progress Notes (Signed)
PHARMACY - ANTICOAGULATION CONSULT NOTE  Pharmacy Consult for Heparin  Indication:  hepatic infarct   No Known Allergies  Patient Measurements: Height: 5' (152.4 cm) Weight: 52.8 kg (116 lb 6 oz) IBW/kg (Calculated) : 45.5 Heparin Dosing Weight: 52.  Vital Signs: Temp: 97.9 F (36.6 C) (10/25 0043) BP: 106/51 (10/25 0043) Pulse Rate: 67 (10/25 0043)  Labs: Recent Labs    07/13/23 1253 07/13/23 1953 07/14/23 0300 07/14/23 1223 07/14/23 2130 07/15/23 0410  HGB 11.4* 11.1*  --   --   --  9.8*  HCT 35.0* 34.9*  --   --   --  30.7*  PLT 316.0 297  --   --   --  270  APTT  --   --  28  --   --   --   LABPROT  --   --  14.1  --   --   --   INR  --   --  1.1  --   --   --   HEPARINUNFRC  --   --   --  0.36 0.43 0.40  CREATININE 1.21* 1.11*  --   --   --   --   TROPONINIHS  --  5  --   --   --   --     Estimated Creatinine Clearance: 26.1 mL/min (A) (by C-G formula based on SCr of 1.11 mg/dL (H)).   Medical History: Past Medical History:  Diagnosis Date   Anemia    Arthritis    CAD (coronary artery disease)    a. 10/2015 PCI [facility], [place], [place]): OM (2.5x30 Resolute DES), LCX (3.5x12 Resolute DES).   Cervical cancer (HCC) 2005   Chronic systolic CHF (congestive heart failure) (HCC)    a. 01/2016 Echo: EF 20-25%, diff HK, septal-lateral dyssynchrony. Mild MR, mod to sev dil LA, nl RV, mod dil RA, mod TR, PASP .   Colon cancer (HCC) 2001   DM (diabetes mellitus) (HCC)    History of chicken pox    HLD (hyperlipidemia)    Hypertensive heart disease    Ischemic cardiomyopathy    a. 01/2016 Echo: EF 20-25%.   LBBB (left bundle branch block)     Medications:  Medications Prior to Admission  Medication Sig Dispense Refill Last Dose   albuterol (PROVENTIL) (2.5 MG/3ML) 0.083% nebulizer solution Take 3 mLs (2.5 mg total) by nebulization 3 (three) times daily as needed for wheezing or shortness of breath. 75 mL 1 prn at unknown   aspirin EC 81 MG tablet Take 81  mg by mouth daily. Swallow whole.   Past Week   atorvastatin (LIPITOR) 40 MG tablet TAKE 1 TABLET BY MOUTH EVERY DAY 90 tablet 2 Past Week   carvedilol (COREG) 25 MG tablet TAKE 1 TABLET (25 MG TOTAL) BY MOUTH TWICE A DAY WITH MEALS 180 tablet 3 Past Week   Cholecalciferol (VITAMIN D3) 25 MCG (1000 UT) CAPS Take 1 capsule (1,000 Units total) by mouth daily. 30 capsule  Past Week   ENTRESTO 49-51 MG TAKE 1 TABLET BY MOUTH TWICE A DAY 60 tablet 12 Past Week   FARXIGA 5 MG TABS tablet TAKE 1 TABLET (5 MG TOTAL) BY MOUTH DAILY. 30 tablet 6 Past Week   metoCLOPramide (REGLAN) 5 MG tablet Take 1 tablet (5 mg total) by mouth 3 (three) times daily as needed for nausea or vomiting (with meals). 30 tablet 0    PROLIA 60 MG/ML SOSY injection Inject 60 mg into the  skin every 6 (six) months.      sertraline (ZOLOFT) 25 MG tablet TAKE 1 TABLET (25 MG TOTAL) BY MOUTH DAILY. (Patient not taking: Reported on 07/14/2023) 90 tablet 3 Not Taking    Assessment: Pharmacy consulted to dose heparin in this 88 yo female admitted with hepatic infarct.   No prior anticoag noted. CrCl = 26.1 ml/min   Date/Time HL Rate  Comment 10/24 1223 0.36 900 units/hr Therapeutic 10/24 2130 0.43  10/25 0410      0.40                            Therapeutic X 3   Goal of Therapy:  Heparin level 0.3-0.7 units/ml Monitor platelets by anticoagulation protocol: Yes   Plan:  Heparin level is therapeutic. Will continue heparin infusion at 900 units/hr. Recheck heparin level and CBC with AM labs.    Shellia Hartl D, PharmD Clinical Pharmacist 07/15/2023,5:10 AM

## 2023-07-15 NOTE — Transfer of Care (Signed)
Immediate Anesthesia Transfer of Care Note  Patient: April Short  Procedure(s) Performed: XI ROBOTIC ASSISTED LAPAROSCOPIC CHOLECYSTECTOMY WITH PRIMARY CARE OF SMALL BOWEL INDOCYANINE GREEN FLUORESCENCE IMAGING (ICG) INTRAOPERATIVE CHOLANGIOGRAM  Patient Location: PACU  Anesthesia Type:General  Level of Consciousness: drowsy  Airway & Oxygen Therapy: Patient Spontanous Breathing and Patient connected to face mask oxygen  Post-op Assessment: Report given to RN and Post -op Vital signs reviewed and stable  Post vital signs: Reviewed and stable  Last Vitals:  Vitals Value Taken Time  BP 112/46 07/15/23 1658  Temp 36.6 C 07/15/23 1658  Pulse 60 07/15/23 1700  Resp 19 07/15/23 1700  SpO2 100 % 07/15/23 1700  Vitals shown include unfiled device data.  Last Pain:  Vitals:   07/15/23 1658  TempSrc:   PainSc: 0-No pain         Complications: No notable events documented.

## 2023-07-15 NOTE — Plan of Care (Signed)
  Problem: Skin Integrity: Goal: Risk for impaired skin integrity will decrease Outcome: Progressing   Problem: Coping: Goal: Ability to adjust to condition or change in health will improve Outcome: Progressing   Problem: Pain Management: Goal: General experience of comfort will improve Outcome: Progressing   Problem: Safety: Goal: Ability to remain free from injury will improve Outcome: Progressing   Problem: Education: Goal: Ability to describe self-care measures that may prevent or decrease complications (Diabetes Survival Skills Education) will improve Outcome: Progressing

## 2023-07-15 NOTE — Plan of Care (Signed)
  Problem: Education: Goal: Knowledge of General Education information will improve Description: Including pain rating scale, medication(s)/side effects and non-pharmacologic comfort measures Outcome: Progressing   Problem: Clinical Measurements: Goal: Ability to maintain clinical measurements within normal limits will improve Outcome: Progressing Goal: Will remain free from infection Outcome: Progressing   Problem: Pain Management: Goal: General experience of comfort will improve Outcome: Progressing

## 2023-07-15 NOTE — Progress Notes (Signed)
Subjective:  CC: April Short is a 88 y.o. female  Hospital stay day 1, Day of Surgery gallstone pancreatitis  HPI: No new complaints  ROS:  General: Denies weight loss, weight gain, fatigue, fevers, chills, and night sweats. Heart: Denies chest pain, palpitations, racing heart, irregular heartbeat, leg pain or swelling, and decreased activity tolerance. Respiratory: Denies breathing difficulty, shortness of breath, wheezing, cough, and sputum. GI: Denies change in appetite, heartburn, nausea, vomiting, constipation, diarrhea, and blood in stool. GU: Denies difficulty urinating, pain with urinating, urgency, frequency, blood in urine.   Objective:   Temp:  [97.9 F (36.6 C)-98.1 F (36.7 C)] 98.1 F (36.7 C) (10/25 0754) Pulse Rate:  [66-114] 66 (10/25 0754) Resp:  [17-18] 18 (10/25 0754) BP: (106-129)/(48-63) 111/48 (10/25 0754) SpO2:  [94 %-98 %] 95 % (10/25 0754)     Height: 5' (152.4 cm) Weight: 52.8 kg BMI (Calculated): 22.73   Intake/Output this shift:   Intake/Output Summary (Last 24 hours) at 07/15/2023 1236 Last data filed at 07/15/2023 0507 Gross per 24 hour  Intake 1330.33 ml  Output --  Net 1330.33 ml    Constitutional :  alert, cooperative, appears stated age, and no distress  Respiratory:  clear to auscultation bilaterally  Cardiovascular:  regular rate and rhythm  Gastrointestinal: soft, non-tender; bowel sounds normal; no masses,  no organomegaly.   Skin: Cool and moist.   Psychiatric: Normal affect, non-agitated, not confused       LABS:     Latest Ref Rng & Units 07/15/2023    4:10 AM 07/13/2023    7:53 PM 07/13/2023   12:53 PM  CMP  Glucose 70 - 99 mg/dL 79  409  811   BUN 8 - 23 mg/dL 16  29  26    Creatinine 0.44 - 1.00 mg/dL 9.14  7.82  9.56   Sodium 135 - 145 mmol/L 137  130  131   Potassium 3.5 - 5.1 mmol/L 4.1  4.9  5.5   Chloride 98 - 111 mmol/L 107  96  96   CO2 22 - 32 mmol/L 24  24  28    Calcium 8.9 - 10.3 mg/dL 7.8  8.9  9.1    Total Protein 6.5 - 8.1 g/dL 6.6  7.8  7.8   Total Bilirubin 0.3 - 1.2 mg/dL 0.7  0.8  0.6   Alkaline Phos 38 - 126 U/L 114  146  159   AST 15 - 41 U/L 18  19  17    ALT 0 - 44 U/L 17  21  18        Latest Ref Rng & Units 07/15/2023    4:10 AM 07/13/2023    7:53 PM 07/13/2023   12:53 PM  CBC  WBC 4.0 - 10.5 K/uL 7.1  7.3  8.1   Hemoglobin 12.0 - 15.0 g/dL 9.8  21.3  08.6   Hematocrit 36.0 - 46.0 % 30.7  34.9  35.0   Platelets 150 - 400 K/uL 270  297  316.0     RADS: N/a Assessment:   Gallstone pancreatitis.  Labs reassuring, no new complaints.  Discussed case with GI re: best approach to further confirm CBD stone.  Recommended proceeding with robotic lap chole with IOC, since labs continue to downtrend and liklihood of retained stone lower now.  Discussed the risk of surgery including post-op infxn, seroma, biloma, chronic pain, poor-delayed wound healing, retained gallstone, conversion to open procedure, post-op SBO or ileus, and need for additional procedures to  address said risks.  The risks of general anesthetic including MI, CVA, sudden death or even reaction to anesthetic medications also discussed. Alternatives include continued observation.  Benefits include possible symptom relief, prevention of complications including acute cholecystitis, pancreatitis.  Typical post operative recovery of 3-5 days rest, continued pain in area and incision sites, possible loose stools up to 4-6 weeks, also discussed.  The patient understands the risks, any and all questions were answered to the patient's satisfaction.     labs/images/medications/previous chart entries reviewed personally and relevant changes/updates noted above.

## 2023-07-15 NOTE — Progress Notes (Signed)
Progress Note   Patient: April Short MVH:[id number] DOB: 1935-04-19 DOA: 07/13/2023     1 DOS: the patient was seen and examined on 07/15/2023   Brief hospital course: April Short is a 88 y.o. 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  Patient is seen by GI, general surgery, vascular teams.  Patient unable to get MRCP due to pacemaker.  Vascular team advised heparin drip per protocol.  General surgery team evaluated advised proceeding with cholecystectomy.  Assessment and Plan: * Portal vein thrombosis Noted portal vein thrombosis on abdominal imaging Continue heparin drip as per pharmacy protocol. Vascular surgery team evaluated her advised to treat her gallstone pancreatitis and continue oral anticoagulation for 6 months.  Possible gallstone pancreatitis Cholelithiasis Recurring abdominal pain x 3 months with imaging findings notable for portal vein thrombosis, cholelithiasis as well as a groove pancreatitis Surgery team discussed with family-grade for cholecystectomy Will stop heparin drip for possible surgical intervention this morning GI team evaluated her, MRCP cannot be done due to pacemaker.  ERCP after surgical intervention if stones are present. Continue IV PPI for gastritis coverage Will continue to follow-up GI and surgery team recommendations.  (HFpEF) heart failure with preserved ejection fraction (HCC) 2d ECHO 08/2022 w/ EF 50-55%  Caution with IV hydration, high risk for fluid overload Her home medication including beta-blocker, [name], Farxiga on hold at this time.  CKD stage 3 due to type 2 diabetes mellitus (HCC) Creatinine 1.1 with GFR in the 40s Appears near baseline Monitor daily renal function. Avoid nephrotoxic drugs.  ICD (implantable cardioverter-defibrillator) in  place St Jude BiV ICD placed in [place] 05/2017   CAD S/P percutaneous coronary angioplasty No active chest pain Troponin within normal limits On heparin drip in the setting of portal vein thrombosis  Hyperlipidemia associated with type 2 diabetes mellitus (HCC) Continue statin  Type 2 diabetes mellitus with peripheral neuropathy (HCC) Accu-Cheks, sliding scale insulin. Hypoglycemia protocol.  Out of bed to chair. Incentive spirometry. Nursing supportive care. Fall, aspiration precautions. DVT prophylaxis   Code Status: Full Code  Subjective: Patient is seen and examined today morning.  Family agreed for surgical intervention.  Granddaughter at bedside who translated.  She has no abdominal pain, nausea today morning.  N.p.o. for surgery.  Tolerated heparin well.  Physical Exam: Vitals:   07/15/23 1658 07/15/23 1700 07/15/23 1715 07/15/23 1730  BP: (!) 112/46 (!) 109/38 (!) 104/37 (!) 104/48  Pulse: 62 61 64 65  Resp: 20 19 (!) 22 19  Temp: 97.8 F (36.6 C)   97.6 F (36.4 C)  TempSrc:      SpO2: 100% 100% 96% 98%  Weight:      Height:        General - Elderly Hispanic female, no apparent distress HEENT - PERRLA, EOMI, atraumatic head, non tender sinuses. Lung - Clear, diffuse rhonchi, no wheezes. Heart - S1, S2 heard, no murmurs, rubs, no pedal edema. Abdomen - Soft, mild epigastric tenderness, bowel sounds good Neuro - Alert, awake and oriented x 3, non focal exam. Skin - Warm and dry.  Data Reviewed:      Latest Ref Rng & Units 07/15/2023    4:10 AM 07/13/2023    7:53 PM 07/13/2023   12:53 PM  CBC  WBC 4.0 - 10.5 K/uL 7.1  7.3  8.1   Hemoglobin 12.0 - 15.0 g/dL  9.8  11.1  11.4   Hematocrit 36.0 - 46.0 % 30.7  34.9  35.0   Platelets 150 - 400 K/uL 270  297  316.0       Latest Ref Rng & Units 07/15/2023    4:10 AM 07/13/2023    7:53 PM 07/13/2023   12:53 PM  BMP  Glucose 70 - 99 mg/dL 79  914  782   BUN 8 - 23 mg/dL 16  29  26    Creatinine 0.44 - 1.00  mg/dL 9.56  2.13  0.86   Sodium 135 - 145 mmol/L 137  130  131   Potassium 3.5 - 5.1 mmol/L 4.1  4.9  5.5   Chloride 98 - 111 mmol/L 107  96  96   CO2 22 - 32 mmol/L 24  24  28    Calcium 8.9 - 10.3 mg/dL 7.8  8.9  9.1    DG C-Arm 1-60 Min-No Report  Result Date: 07/15/2023 Fluoroscopy was utilized by the requesting physician.  No radiographic interpretation.   DG C-Arm 1-60 Min-No Report  Result Date: 07/15/2023 Fluoroscopy was utilized by the requesting physician.  No radiographic interpretation.   CT ABDOMEN PELVIS W CONTRAST  Result Date: 07/14/2023 CLINICAL DATA:  Acute nonlocalized abdominal pain. Evaluate right branch portal vein thrombosis. Abdominal pain and not eating for the past 9 days. EXAM: CT ABDOMEN AND PELVIS WITH CONTRAST TECHNIQUE: Multidetector CT imaging of the abdomen and pelvis was performed using the standard protocol following bolus administration of intravenous contrast. RADIATION DOSE REDUCTION: This exam was performed according to the departmental dose-optimization program which includes automated exposure control, adjustment of the mA and/or kV according to patient size and/or use of iterative reconstruction technique. CONTRAST:  75mL OMNIPAQUE IOHEXOL 300 MG/ML  SOLN COMPARISON:  Ultrasound abdomen 07/14/2023. Abdominal radiograph 07/13/2023. CT 06/20/2023 FINDINGS: Lower chest: Emphysematous changes and scarring in the lung bases. Mild bronchiectasis. Hepatobiliary: There is focal decreased flow in the right anterior portal vein with zone of low-attenuation consistent with portal venous thrombosis. This corresponds to ultrasound finding and is new since prior study. The remaining portal veins are patent. Cholelithiasis. No bile duct dilatation. Stone or sludge suggested in the distal common bile duct. Pancreas: Since the previous study, there is interval development of low-attenuation change in the head of the pancreas and extending along the celiac axis around the  hepatic artery. This is causing extrinsic narrowing of the hepatic artery. Given the short time frame during which this developed, it is most likely inflammatory. 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 Follow-up to resolution is recommended to exclude a rapidly developing neoplasm. The remainder of the pancreas appears otherwise normal. Spleen: Normal in size without focal abnormality. Adrenals/Urinary Tract: No adrenal gland nodules. Bilateral hydronephrosis and hydroureter to the level of the bladder. No obstructing lesion is demonstrated. This could represent reflux or stasis. Bladder wall is diffusely thickened suggesting cystitis. Correlate with urinalysis. Stomach/Bowel: Stomach, small bowel, and colon are not abnormally distended. No wall thickening or inflammatory changes. Appendix is normal. Vascular/Lymphatic: Aortic atherosclerosis. No enlarged abdominal or pelvic lymph nodes. Surgical clips in the retroperitoneum. Reproductive: Status post hysterectomy. No adnexal masses. Other: No free air or free fluid in the abdomen. Abdominal wall musculature appears intact. Musculoskeletal: Degenerative changes in the spine. No destructive bone lesions. IMPRESSION: 1. Right anterior branch portal vein thrombosis with zone of low-attenuation in the anterior right lobe extending to the dome, likely representing hepatic infarct. This is new since prior study. 2. New development of  masslike low-attenuation and infiltration around the head of the pancreas and extending to the celiac axis surrounding the hepatic artery. 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 Follow-up after resolution of acute process is recommended to exclude underlying neoplasm. 3. Cholelithiasis. Stone or sludge suggested in the distal common bile duct. No bile duct dilatation. 4. Emphysematous changes and scarring in the lung bases with mild  bronchiectasis. 5. Bilateral hydronephrosis and hydroureter to the level of the bladder. No stone or obstructing lesion demonstrated. This may indicate reflux or stasis. 6. Diffuse bladder wall thickening may indicate bladder outlet obstruction or cystitis. Correlate with urinalysis. Electronically Signed   By: Burman Nieves M.D.   On: 07/14/2023 02:37   US ABDOMEN LIMITED RUQ (LIVER/GB)  Result Date: 07/14/2023 CLINICAL DATA:  Elevated liver function studies. Elevated lipase. Abdominal pain. EXAM: ULTRASOUND ABDOMEN LIMITED RIGHT UPPER QUADRANT COMPARISON:  CT 06/20/2023 FINDINGS: Gallbladder: Contracted gallbladder with stones and sludge. No wall thickening or edema. Murphy's sign is negative. Common bile duct: Diameter: 6 mm, normal Liver: No focal lesion identified. Within normal limits in parenchymal echogenicity. A posterior right portal venous branch demonstrates wall thickening with absent flow on color flow Doppler imaging, suggesting thrombosis. This was not present on the previous study. Portal veins in this area appear patent on the CT. Main portal vein is patent with flow in the appropriate direction. Other: None. IMPRESSION: 1. Cholelithiasis without evidence of acute cholecystitis. 2. Possible thrombosis of a right portal venous branch. This was not present on previous CT and could be artifact. The main portal vein is patent with flow in the appropriate direction. Electronically Signed   By: Burman Nieves M.D.   On: 07/14/2023 01:14     Family Communication: Discussed with patient and her granddaughter at bedside.  They understand and agree. All questions answereed.  Disposition: Status is: Inpatient Remains inpatient appropriate because: pending surgical intervention  Planned Discharge Destination: Home with Home Health     MDM level 3-patient is admitted with recurrent abdominal pain, portal vein thrombosis, group pancreatitis, cholelithiasis.  She is seen by surgery, GI and  vascular teams.  Currently she is scheduled for surgical intervention and he she is at high risk for clinical deterioration.  Author: Marcelino Duster, MD 07/15/2023 5:40 PM Secure chat 7am to 7pm For on call review [email or web address].

## 2023-07-15 NOTE — Anesthesia Procedure Notes (Signed)
Procedure Name: Intubation Date/Time: 07/15/2023 1:15 PM  Performed by: Mohammed Kindle, CRNAPre-anesthesia Checklist: Patient identified, Emergency Drugs available, Suction available and Patient being monitored Patient Re-evaluated:Patient Re-evaluated prior to induction Oxygen Delivery Method: Circle system utilized Preoxygenation: Pre-oxygenation with 100% oxygen Induction Type: IV induction Ventilation: Mask ventilation without difficulty Laryngoscope Size: McGraph and 3 Grade View: Grade I Tube type: Oral Tube size: 6.5 mm Number of attempts: 1 Airway Equipment and Method: Stylet Placement Confirmation: ETT inserted through vocal cords under direct vision, positive ETCO2, breath sounds checked- equal and bilateral and CO2 detector Secured at: 21 cm Tube secured with: Tape Dental Injury: Teeth and Oropharynx as per pre-operative assessment

## 2023-07-15 NOTE — Progress Notes (Signed)
GI note: Patient has gone for surgery not in room presently.   I discussed her case with Dr Tonna Boehringer this morning and plan made was to proceed with cholecystectomy for gall stone pancreatitis - also plan for IO cholangiogram - is positive will need ERCP , if negative or inconclusive then depending on her labs overall clinical picture will need EUS as in or outpatient to rule out stones in the common bile duct and rule out mass in the pancreas.   Plan discussed with Dr Servando Snare who is on call over the weekend.   I will sign off.  Please call me if any further GI concerns or questions.  We would like to thank you for the opportunity to participate in the care of April Short.   Dr Wyline Mood MD,MRCP [facility]) Gastroenterology/Hepatology Pager: [phone]

## 2023-07-16 ENCOUNTER — Inpatient Hospital Stay: Admit: 2023-07-16 | Payer: 59 | Admitting: Internal Medicine

## 2023-07-16 ENCOUNTER — Inpatient Hospital Stay (HOSPITAL_COMMUNITY)
Admission: AD | Admit: 2023-07-16 | Discharge: 2023-07-21 | DRG: 444 | Disposition: A | Discharge status: Home / Self Care | Payer: 59 | Source: Other Acute Inpatient Hospital | Attending: Internal Medicine | Admitting: Internal Medicine

## 2023-07-16 ENCOUNTER — Encounter (HOSPITAL_COMMUNITY): Payer: Self-pay

## 2023-07-16 DIAGNOSIS — R1010 Upper abdominal pain, unspecified: Secondary | ICD-10-CM

## 2023-07-16 DIAGNOSIS — R932 Abnormal findings on diagnostic imaging of liver and biliary tract: Secondary | ICD-10-CM | POA: Diagnosis not present

## 2023-07-16 DIAGNOSIS — K839 Disease of biliary tract, unspecified: Secondary | ICD-10-CM

## 2023-07-16 DIAGNOSIS — N1831 Chronic kidney disease, stage 3a: Secondary | ICD-10-CM | POA: Diagnosis not present

## 2023-07-16 DIAGNOSIS — K8021 Calculus of gallbladder without cholecystitis with obstruction: Secondary | ICD-10-CM | POA: Diagnosis not present

## 2023-07-16 DIAGNOSIS — I81 Portal vein thrombosis: Secondary | ICD-10-CM

## 2023-07-16 DIAGNOSIS — K851 Biliary acute pancreatitis without necrosis or infection: Secondary | ICD-10-CM | POA: Diagnosis present

## 2023-07-16 DIAGNOSIS — I130 Hypertensive heart and chronic kidney disease with heart failure and stage 1 through stage 4 chronic kidney disease, or unspecified chronic kidney disease: Secondary | ICD-10-CM | POA: Diagnosis present

## 2023-07-16 DIAGNOSIS — E11649 Type 2 diabetes mellitus with hypoglycemia without coma: Secondary | ICD-10-CM | POA: Diagnosis not present

## 2023-07-16 DIAGNOSIS — K8050 Calculus of bile duct without cholangitis or cholecystitis without obstruction: Secondary | ICD-10-CM | POA: Diagnosis not present

## 2023-07-16 DIAGNOSIS — Z79899 Other long term (current) drug therapy: Secondary | ICD-10-CM

## 2023-07-16 DIAGNOSIS — K5710 Diverticulosis of small intestine without perforation or abscess without bleeding: Secondary | ICD-10-CM | POA: Diagnosis present

## 2023-07-16 DIAGNOSIS — E1142 Type 2 diabetes mellitus with diabetic polyneuropathy: Secondary | ICD-10-CM

## 2023-07-16 DIAGNOSIS — I255 Ischemic cardiomyopathy: Secondary | ICD-10-CM | POA: Diagnosis present

## 2023-07-16 DIAGNOSIS — Z9049 Acquired absence of other specified parts of digestive tract: Secondary | ICD-10-CM

## 2023-07-16 DIAGNOSIS — N1832 Chronic kidney disease, stage 3b: Secondary | ICD-10-CM | POA: Diagnosis present

## 2023-07-16 DIAGNOSIS — Z8541 Personal history of malignant neoplasm of cervix uteri: Secondary | ICD-10-CM

## 2023-07-16 DIAGNOSIS — Z833 Family history of diabetes mellitus: Secondary | ICD-10-CM

## 2023-07-16 DIAGNOSIS — E119 Type 2 diabetes mellitus without complications: Secondary | ICD-10-CM | POA: Diagnosis not present

## 2023-07-16 DIAGNOSIS — K8070 Calculus of gallbladder and bile duct without cholecystitis without obstruction: Secondary | ICD-10-CM | POA: Diagnosis present

## 2023-07-16 DIAGNOSIS — E1122 Type 2 diabetes mellitus with diabetic chronic kidney disease: Secondary | ICD-10-CM | POA: Diagnosis present

## 2023-07-16 DIAGNOSIS — Z95810 Presence of automatic (implantable) cardiac defibrillator: Secondary | ICD-10-CM | POA: Diagnosis not present

## 2023-07-16 DIAGNOSIS — I2510 Atherosclerotic heart disease of native coronary artery without angina pectoris: Secondary | ICD-10-CM | POA: Diagnosis present

## 2023-07-16 DIAGNOSIS — N183 Chronic kidney disease, stage 3 unspecified: Secondary | ICD-10-CM

## 2023-07-16 DIAGNOSIS — Z955 Presence of coronary angioplasty implant and graft: Secondary | ICD-10-CM

## 2023-07-16 DIAGNOSIS — Z7984 Long term (current) use of oral hypoglycemic drugs: Secondary | ICD-10-CM | POA: Diagnosis not present

## 2023-07-16 DIAGNOSIS — I110 Hypertensive heart disease with heart failure: Secondary | ICD-10-CM | POA: Diagnosis not present

## 2023-07-16 DIAGNOSIS — E785 Hyperlipidemia, unspecified: Secondary | ICD-10-CM | POA: Diagnosis present

## 2023-07-16 DIAGNOSIS — E16A1 Hypoglycemia level 1: Secondary | ICD-10-CM | POA: Diagnosis not present

## 2023-07-16 DIAGNOSIS — K8020 Calculus of gallbladder without cholecystitis without obstruction: Secondary | ICD-10-CM | POA: Diagnosis present

## 2023-07-16 DIAGNOSIS — D631 Anemia in chronic kidney disease: Secondary | ICD-10-CM | POA: Diagnosis present

## 2023-07-16 DIAGNOSIS — E875 Hyperkalemia: Secondary | ICD-10-CM | POA: Diagnosis present

## 2023-07-16 DIAGNOSIS — Z7901 Long term (current) use of anticoagulants: Secondary | ICD-10-CM | POA: Diagnosis not present

## 2023-07-16 DIAGNOSIS — Z7982 Long term (current) use of aspirin: Secondary | ICD-10-CM | POA: Diagnosis not present

## 2023-07-16 DIAGNOSIS — Z9889 Other specified postprocedural states: Secondary | ICD-10-CM

## 2023-07-16 DIAGNOSIS — I5022 Chronic systolic (congestive) heart failure: Secondary | ICD-10-CM | POA: Diagnosis present

## 2023-07-16 DIAGNOSIS — R748 Abnormal levels of other serum enzymes: Secondary | ICD-10-CM | POA: Diagnosis not present

## 2023-07-16 DIAGNOSIS — G3184 Mild cognitive impairment, so stated: Secondary | ICD-10-CM | POA: Diagnosis present

## 2023-07-16 DIAGNOSIS — Z85038 Personal history of other malignant neoplasm of large intestine: Secondary | ICD-10-CM | POA: Diagnosis not present

## 2023-07-16 DIAGNOSIS — T797XXA Traumatic subcutaneous emphysema, initial encounter: Secondary | ICD-10-CM | POA: Diagnosis not present

## 2023-07-16 DIAGNOSIS — Z800 Family history of malignant neoplasm of digestive organs: Secondary | ICD-10-CM

## 2023-07-16 DIAGNOSIS — N3000 Acute cystitis without hematuria: Secondary | ICD-10-CM | POA: Diagnosis present

## 2023-07-16 DIAGNOSIS — I447 Left bundle-branch block, unspecified: Secondary | ICD-10-CM | POA: Diagnosis present

## 2023-07-16 LAB — CBC
HCT: 33.7 % — ABNORMAL LOW (ref 36.0–46.0)
Hemoglobin: 10.6 g/dL — ABNORMAL LOW (ref 12.0–15.0)
MCH: 29 pg (ref 26.0–34.0)
MCHC: 31.5 g/dL (ref 30.0–36.0)
MCV: 92.3 fL (ref 80.0–100.0)
Platelets: 256 10*3/uL (ref 150–400)
RBC: 3.65 MIL/uL — ABNORMAL LOW (ref 3.87–5.11)
RDW: 14 % (ref 11.5–15.5)
WBC: 12.7 10*3/uL — ABNORMAL HIGH (ref 4.0–10.5)
nRBC: 0 % (ref 0.0–0.2)

## 2023-07-16 LAB — COMPREHENSIVE METABOLIC PANEL
ALT: 16 U/L (ref 0–44)
AST: 26 U/L (ref 15–41)
Albumin: 2.6 g/dL — ABNORMAL LOW (ref 3.5–5.0)
Alkaline Phosphatase: 116 U/L (ref 38–126)
Anion gap: 9 (ref 5–15)
BUN: 21 mg/dL (ref 8–23)
CO2: 22 mmol/L (ref 22–32)
Calcium: 7.8 mg/dL — ABNORMAL LOW (ref 8.9–10.3)
Chloride: 105 mmol/L (ref 98–111)
Creatinine, Ser: 1.19 mg/dL — ABNORMAL HIGH (ref 0.44–1.00)
GFR, Estimated: 45 mL/min — ABNORMAL LOW (ref >60)
Glucose, Bld: 93 mg/dL (ref 70–99)
Potassium: 5.3 mmol/L — ABNORMAL HIGH (ref 3.5–5.1)
Sodium: 136 mmol/L (ref 135–145)
Total Bilirubin: 0.9 mg/dL (ref 0.3–1.2)
Total Protein: 6.8 g/dL (ref 6.5–8.1)

## 2023-07-16 LAB — GLUCOSE, CAPILLARY
Glucose-Capillary: 121 mg/dL — ABNORMAL HIGH (ref 70–99)
Glucose-Capillary: 169 mg/dL — ABNORMAL HIGH (ref 70–99)
Glucose-Capillary: 178 mg/dL — ABNORMAL HIGH (ref 70–99)
Glucose-Capillary: 69 mg/dL — ABNORMAL LOW (ref 70–99)
Glucose-Capillary: 78 mg/dL (ref 70–99)
Glucose-Capillary: 83 mg/dL (ref 70–99)
Glucose-Capillary: 93 mg/dL (ref 70–99)

## 2023-07-16 MED ORDER — ONDANSETRON HCL 4 MG PO TABS: 4.0000 mg | ORAL_TABLET | Freq: Four times a day (QID) | ORAL | Status: DC | PRN

## 2023-07-16 MED ORDER — OXYCODONE HCL 5 MG PO TABS
5.0000 mg | ORAL_TABLET | ORAL | Status: DC | PRN
  Administered 2023-07-17: 5 mg via ORAL
  Filled 2023-07-16: qty 1

## 2023-07-16 MED ORDER — DEXTROSE 50 % IV SOLN
25.0000 g | INTRAVENOUS | Status: AC
  Administered 2023-07-16: 25 g via INTRAVENOUS
  Filled 2023-07-16: qty 50

## 2023-07-16 MED ORDER — ACETAMINOPHEN 325 MG PO TABS
650.0000 mg | ORAL_TABLET | Freq: Four times a day (QID) | ORAL | Status: DC | PRN
  Filled 2023-07-16 (×2): qty 2

## 2023-07-16 MED ORDER — PANTOPRAZOLE SODIUM 40 MG IV SOLR
40.0000 mg | INTRAVENOUS | Status: DC
  Administered 2023-07-16 – 2023-07-20 (×5): 40 mg via INTRAVENOUS
  Filled 2023-07-16 (×5): qty 10

## 2023-07-16 MED ORDER — ACETAMINOPHEN 650 MG RE SUPP: 650.0000 mg | Freq: Four times a day (QID) | RECTAL | Status: DC | PRN

## 2023-07-16 MED ORDER — CARVEDILOL 12.5 MG PO TABS: 12.5000 mg | ORAL_TABLET | Freq: Two times a day (BID) | ORAL | Status: DC

## 2023-07-16 MED ORDER — HYDROMORPHONE HCL 1 MG/ML IJ SOLN
0.5000 mg | INTRAMUSCULAR | Status: DC | PRN
  Administered 2023-07-17: 0.5 mg via INTRAVENOUS
  Filled 2023-07-16: qty 0.5

## 2023-07-16 MED ORDER — ONDANSETRON HCL 4 MG/2ML IJ SOLN: 4.0000 mg | Freq: Four times a day (QID) | INTRAMUSCULAR | Status: DC | PRN

## 2023-07-16 MED ORDER — INSULIN ASPART 100 UNIT/ML IJ SOLN
0.0000 [IU] | Freq: Three times a day (TID) | INTRAMUSCULAR | Status: DC
  Administered 2023-07-17: 1 [IU] via SUBCUTANEOUS

## 2023-07-16 MED ORDER — LORAZEPAM 2 MG/ML IJ SOLN: 0.5000 mg | Freq: Once | INTRAMUSCULAR | Status: DC

## 2023-07-16 MED ORDER — HEPARIN (PORCINE) 25000 UT/250ML-% IV SOLN
900.0000 [IU]/h | INTRAVENOUS | Status: DC
  Administered 2023-07-16 – 2023-07-17 (×2): 900 [IU]/h via INTRAVENOUS
  Filled 2023-07-16 (×2): qty 250

## 2023-07-16 NOTE — Progress Notes (Signed)
S/p rob chole, IOC w ? Cbd stones She is clinically doing well She has biventr ICD st Jude apparently [facility] not   PE NAD Abd: soft, appropriate incisional tenderness  A/p Needs MRCP if CBD stones may need EUS + ERCP Xfer t [facility] for MRCP

## 2023-07-16 NOTE — Progress Notes (Signed)
PHARMACY - ANTICOAGULATION CONSULT NOTE  Pharmacy Consult for heparin Indication:  portal vein thrombosis   No Known Allergies  Patient Measurements:   Heparin Dosing Weight: 52.8 kg   Vital Signs: Temp: 98.3 F (36.8 C) (10/26 1713) Temp Source: Oral (10/26 1713) BP: 112/45 (10/26 1713) Pulse Rate: 69 (10/26 1713)  Labs: Recent Labs    07/13/23 1953 07/14/23 0300 07/14/23 1223 07/14/23 2130 07/15/23 0410 07/16/23 0458  HGB 11.1*  --   --   --  9.8* 10.6*  HCT 34.9*  --   --   --  30.7* 33.7*  PLT 297  --   --   --  270 256  APTT  --  28  --   --   --   --   LABPROT  --  14.1  --   --   --   --   INR  --  1.1  --   --   --   --   HEPARINUNFRC  --   --  0.36 0.43 0.40  --   CREATININE 1.11*  --   --   --  0.97 1.19*  TROPONINIHS 5  --   --   --   --   --     Estimated Creatinine Clearance: 24.4 mL/min (A) (by C-G formula based on SCr of 1.19 mg/dL (H)).   Medical History: Past Medical History:  Diagnosis Date   Anemia    Arthritis    CAD (coronary artery disease)    a. 10/2015 PCI [facility], [place], [place]): OM (2.5x30 Resolute DES), LCX (3.5x12 Resolute DES).   Cervical cancer (HCC) 2005   Chronic systolic CHF (congestive heart failure) (HCC)    a. 01/2016 Echo: EF 20-25%, diff HK, septal-lateral dyssynchrony. Mild MR, mod to sev dil LA, nl RV, mod dil RA, mod TR, PASP .   Colon cancer (HCC) 2001   DM (diabetes mellitus) (HCC)    History of chicken pox    HLD (hyperlipidemia)    Hypertensive heart disease    Ischemic cardiomyopathy    a. 01/2016 Echo: EF 20-25%.   LBBB (left bundle branch block)     Medications:  Scheduled:   pantoprazole (PROTONIX) IV  40 mg Intravenous Q24H   Infusions:  PRN: acetaminophen **OR** acetaminophen, HYDROmorphone (DILAUDID) injection, ondansetron **OR** ondansetron (ZOFRAN) IV, oxyCODONE  Assessment: Patient was on heparin infusion at [facility] for portal vein thrombosis. Upon transfer to [facility], I visited the room and did  not find the patient to be on heparin. Pharmacy was reconsulted. Given stability on dosing at [facility], will resume heparin at 900 units/hr as the level was therapeutic multiple times at this dose.   Goal of Therapy:  Heparin level 0.3-0.7 units/ml Monitor platelets by anticoagulation protocol: Yes   Plan:  Start heparin infusion at 900 units/hr Daily heparin level and CBC  Cedric Fishman 07/16/2023,5:19 PM

## 2023-07-16 NOTE — Plan of Care (Signed)
  Problem: Education: Goal: Knowledge of General Education information will improve Description: Including pain rating scale, medication(s)/side effects and non-pharmacologic comfort measures Outcome: Progressing   Problem: Pain Management: Goal: General experience of comfort will improve Outcome: Progressing   Problem: Safety: Goal: Ability to remain free from injury will improve Outcome: Progressing   Problem: Skin Integrity: Goal: Risk for impaired skin integrity will decrease Outcome: Progressing

## 2023-07-16 NOTE — Op Note (Addendum)
Preoperative diagnosis:  cholelithiasis and pancreatitis  Postoperative diagnosis: same as above plus intra-abdominal adhesions, small bowel serosal tear, choledocolithiasis  Procedure: Robotic assisted Laparoscopic Cholecystectomy, lysis of adhesions, intraopertive cholangiogram  Anesthesia: GETA   Surgeon: Sung Amabile  Specimen: Gallbladder  Complications: None  EBL: 15mL  Wound Classification: Clean Contaminated  Indications: see HPI  Findings: 1.Dense intraabdominal adhesions involving small bowel, 2.Serosal tear noted along 2cm segment of small bowel after lysis of adhesions, no spillage of enteric contents 3.Critical view of safety noted 4. IOC completed, choledocolithiasis noted on final radiology interpretation. 5.Cystic duct and artery identified, ligated and divided, clips remained intact at end of procedure Adequate hemostasis  Description of procedure:  The patient was placed on the operating table in the supine position. SCDs placed, pre-op abx administered.  General anesthesia was induced and OG tube placed by anesthesia. A time-out was completed verifying correct patient, procedure, site, positioning, and implant(s) and/or special equipment prior to beginning this procedure. The abdomen was prepped and draped in the usual sterile fashion.    Veress needle was placed at the Palmer's point and insufflation was started after confirming a positive saline drop test and no immediate increase in abdominal pressure.  After reaching 15 mm, the Veress needle was removed and a 8 mm port was placed via optiview technique in same area.  Abdominal cavity seemed to be entered but multiple adhesions noted to fatty tissue and what looked like bowel serosa.  Additional port placed inferior to initial insertion to try to obtain better view, again using optiview technique.  This allowed actual visualization of the remaining abdominal cavity.  Dense adhesions were noted around the site of  palmer's point.  Two Additional ports placed along level of umbilicus and right of the umbilicus under direct visualization.  Laparoscopic scissors then used to perform extensive lysis of adhesions to remove all adhesions from abdominal wall.  Once area cleared for remaining ports on left side of adomen, the 12mm and 8mm port placed along umbilcal level for the typical setup for a robotic lap chole. This portion took the majority of the operative time. Once all adhesions removed, inspection of involved bowel noted possible serosal injury.  Decision made to directly inspect and palpate area of concern after lap chole and IOC portion of procedure since it was difficult to fully assess with even the robotic camera.  No obvious enteric contents noted at this time.  The table was placed in the reverse Trendelenburg position with the right side up. The Xi platform was brought into the operative field and docked to the ports successfully.  An endoscope was placed through the umbilical port, fenestrated grasper through the adjacent patient right port, prograsp to the far patient left port, and then a hook cautery in the left port. 12G angio cath then placed over the gallbladder in preparation for the IOC catheter.  The dome of the gallbladder was grasped with prograsp, passed and retracted over the dome of the liver. Adhesions between the gallbladder and omentum, duodenum and transverse colon were lysed via hook cautery. The infundibulum was grasped with the fenestrated grasper and retracted toward the right lower quadrant. This maneuver exposed Calot's triangle. The peritoneum overlying the gallbladder infundibulum was then dissected  and the cystic duct and cystic artery identified.  Critical view of safety with the liver bed clearly visible behind the duct and artery with no additional structures noted.    The cystic duct clipped on distal apsect, and small hole made  in cystic duct with hook, confirming bile.   Reddick catheter then guided through previously placed angio cath and graspers used to feed into cystic duct.  Balloon inflated and saline injected to confirm no leak from hole created.  Robot arms then adjusted to allow c-arm to take adequate video and picture for cholangiogram.  Cholangiogram performed by injecting contrast with successful visualization of biliary tree.  Please see rad report for details.  C-arm removed out of operative field and catheter removed under direct visualization.  Two clips placed proximal to hole made in cystic duct, confirmed no further bile leakeage.  Clips than placed on cystic artery to ligate it.    The gallbladder was then dissected from its peritoneal and liver bed attachments by electrocautery. Suction used to remove excess fluid and leaked bile from area.  24fr drain placed through RLQ port and placed in fossa area, secured to skin using 3-0 nylon. Hemostasis was checked prior to removing the hook cautery and the Endo Catch bag was then placed through the 12 mm port and the gallbladder was removed.  The gallbladder was passed off the table as a specimen. There was no evidence of bleeding from the gallbladder fossa or cystic artery or leakage of the bile from the cystic duct stump.   Abdomen desufflated and  trocars removed. 12mm port site extended to pull out portion of small bowel of concern that was noted to be directly deep to port site area during initial LOA.  Inspection of the bowel did indeed note superficial serosal tear but no full thickness injury.  No additional area of concern noted beyond this area.  3-0 silk used to lembert suture the serosal tear, and bowel contents reduced back into abdomen.    The 12 mm port site using 0 vicryl x2 under direct vision.  all ports sites infused with local and all skin incisions then closed with subcuticular sutures of 4-0 monocryl and dressed with topical skin adhesive. The orogastric tube was removed and patient  extubated.  The patient tolerated the procedure well and was taken to the postanesthesia care unit in stable condition.  All sponge and instrument count correct at end of procedure.

## 2023-07-16 NOTE — Progress Notes (Signed)
Patient's CBG was 69 mg/dL. MD notified. Nurse advised by MD to follow hypoglycemia protocol, as detrose 50 % 25 g was ordered to administer. Nurse administered 50 mL of dextrose. Nurse rechecked patient's CBG; CBG was 178. Notified MD, nurse asked if insulin according to sliding scale should be given now or at 12 p. Was advised to wait until 12p to give insulin.

## 2023-07-16 NOTE — Discharge Summary (Addendum)
Physician Discharge Summary   Patient: April Short MRN: [id number] DOB: 07/11/1935  Admit date:     07/13/2023  Discharge date: 07/16/23  Discharge Physician: Delfino Lovett   PCP: Eustaquio Boyden, MD   Recommendations at discharge:   Transfer to [facility] for special MRCP due to [facility] biventricular defibrillator   Discharge Diagnoses: Principal Problem:   Portal vein thrombosis Active Problems:   Upper abdominal pain   Pancreatitis   Cholelithiasis   Type 2 diabetes mellitus with peripheral neuropathy (HCC)   Hyperlipidemia associated with type 2 diabetes mellitus (HCC)   CAD S/P percutaneous coronary angioplasty   ICD (implantable cardioverter-defibrillator) in place   CKD stage 3 due to type 2 diabetes mellitus (HCC)   (HFpEF) heart failure with preserved ejection fraction [facility])  Hospital Course: Assessment and Plan:  April Short is a 88 y.o. 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  Patient is seen by GI, general surgery, vascular teams.  Patient unable to get MRCP due to pacemaker.  Vascular team advised heparin drip per protocol.  General surgery team evaluated advised proceeding with cholecystectomy.  10/25: Robotic assisted lap chole, lysis of additions and intraoperative cholangiogram showing possible gallstone in the CBD 10/26: Surgical team requesting MRCP but radiology team is unable to perform it here due to [facility] biventricular defibrillator requiring transfer to [facility]   Assessment and Plan: * Portal vein thrombosis Noted portal vein thrombosis on abdominal imaging Continue heparin drip as per pharmacy protocol. Vascular surgery team evaluated her advised to treat her gallstone pancreatitis and continue oral anticoagulation for 6 months.    Possible gallstone pancreatitis Cholelithiasis Suspected choledocholithiasis Recurring abdominal pain x 3 months with imaging findings notable for portal vein thrombosis, cholelithiasis as well as a groove pancreatitis Robotic assisted lap chole on 10/25 with, lysis of additions and intraoperative cholangiogram showing possible gallstone in the CBD Surgical team recommending MRCP for further evaluation and need for ERCP.  Unfortunately due to patient having [employer] biventricular defibrillator, radiology is unable to perform MRCP here and recommending transfer to [facility] Continue IV PPI for gastritis coverage Consult GI and surgery team at [facility] depending on MRCP results, once patient is admitted under hospitalist there   (HFpEF) heart failure with preserved ejection fraction (HCC) 2d ECHO 08/2022 w/ EF 50-55%  Her home medication including beta-blocker, [name], Farxiga on hold at this time.   CKD stage 3 due to type 2 diabetes mellitus (HCC) Stable   ICD (implantable cardioverter-defibrillator) in place St Jude BiV ICD placed in [place] 05/2017    CAD S/P percutaneous coronary angioplasty No active chest pain Troponin within normal limits   Hyperlipidemia associated with type 2 diabetes mellitus (HCC) Continue statin   Type 2 diabetes mellitus with peripheral neuropathy (HCC) Accu-Cheks, sliding scale insulin. Hypoglycemia protocol.          Consultants: Surgery, GI, vascular surgery Procedures performed: Robotic assisted lap chole, lysis of additions and intraoperative cholangiogram Disposition:  [facility] Diet recommendation:  Discharge Diet Orders (From admission, onward)     Start     Ordered   07/16/23 0000  Diet - low sodium heart healthy        07/16/23 1306           NPO   DISCHARGE MEDICATION: Allergies as of 07/16/2023  No Known Allergies      Medication List     STOP taking these medications    sertraline 25 MG  tablet Commonly known as: ZOLOFT       TAKE these medications    albuterol (2.5 MG/3ML) 0.083% nebulizer solution Commonly known as: PROVENTIL Take 3 mLs (2.5 mg total) by nebulization 3 (three) times daily as needed for wheezing or shortness of breath.   aspirin EC 81 MG tablet Take 81 mg by mouth daily. Swallow whole.   atorvastatin 40 MG tablet Commonly known as: LIPITOR TAKE 1 TABLET BY MOUTH EVERY DAY   carvedilol 25 MG tablet Commonly known as: COREG TAKE 1 TABLET (25 MG TOTAL) BY MOUTH TWICE A DAY WITH MEALS   Entresto 49-51 MG Generic drug: sacubitril-valsartan TAKE 1 TABLET BY MOUTH TWICE A DAY   Farxiga 5 MG Tabs tablet Generic drug: dapagliflozin propanediol TAKE 1 TABLET (5 MG TOTAL) BY MOUTH DAILY.   metoCLOPramide 5 MG tablet Commonly known as: Reglan Take 1 tablet (5 mg total) by mouth 3 (three) times daily as needed for nausea or vomiting (with meals).   Prolia 60 MG/ML Sosy injection Generic drug: denosumab Inject 60 mg into the skin every 6 (six) months.   Vitamin D3 25 MCG (1000 UT) Caps Take 1 capsule (1,000 Units total) by mouth daily.        Discharge Exam: Filed Weights   07/13/23 1950  Weight: 52.8 kg   General - Elderly Spanish speaking female, no apparent distress HEENT - PERRLA, EOMI, atraumatic head, non tender sinuses. Lung - Clear, diffuse rhonchi, no wheezes. Heart - S1, S2 heard, no murmurs, rubs, no pedal edema. Abdomen - Soft, gallbladder drain in place draining serosanguineous/bloody discharge.  Surgical site looking clean Neuro - Alert, awake and oriented x 3, non focal exam. Skin - Warm and dry.  Condition at discharge: stable  The results of significant diagnostics from this hospitalization (including imaging, microbiology, ancillary and laboratory) are listed below for reference.   Imaging Studies: DG Cholangiogram Operative  Result Date: 07/16/2023 CLINICAL DATA:  Intraoperative cholangiogram during  cholecystectomy EXAM: INTRAOPERATIVE CHOLANGIOGRAM TECHNIQUE: Cholangiographic images from the C-arm fluoroscopic device were submitted for interpretation post-operatively. Please see the procedural report for the amount of contrast and the fluoroscopy time utilized. FLUOROSCOPY: Radiation Exposure Index (as provided by the fluoroscopic device): 10.2 mGy Kerma COMPARISON:  CT abdomen pelvis 06/20/2023 FINDINGS: Four intraoperative fluoroscopic images and 1 cine clip were provided for interpretation. The submitted images demonstrate opacification of the intra and extrahepatic bile ducts through cystic duct remnant. Multiple filling defects in the distal common bile duct are most likely due to choledocholithiasis. IMPRESSION: Multiple filling defects seen in the distal common bile duct most likely due to choledocholithiasis. Correlation with MRCP or ERCP would be beneficial to exclude underlying mass. Review of most recent abdominal CT shows a duodenal diverticulum at the insertion of the common bile duct which may make ERCP challenging. Electronically Signed   By: Acquanetta Belling M.D.   On: 07/16/2023 06:17   DG C-Arm 1-60 Min-No Report  Result Date: 07/15/2023 Fluoroscopy was utilized by the requesting physician.  No radiographic interpretation.   DG C-Arm 1-60 Min-No Report  Result Date: 07/15/2023 Fluoroscopy was utilized by the requesting physician.  No radiographic interpretation.   DG Abd 2 Views  Result Date: 07/14/2023 CLINICAL DATA:  Abdominal pain and constipation EXAM: ABDOMEN - 2 VIEW COMPARISON:  CT 06/20/2023 FINDINGS: Gas and stool throughout the colon. No  small or large bowel distention. No abnormal air-fluid levels. No free intra-abdominal air. No radiopaque stones. Surgical clips in the lower abdomen and pelvis. Lumbar scoliosis convex towards the left. Degenerative changes in the spine. Soft tissue contours are normal. Lung bases are clear. IMPRESSION: Nonobstructive bowel gas pattern.  Electronically Signed   By: Burman Nieves M.D.   On: 07/14/2023 02:38   CT ABDOMEN PELVIS W CONTRAST  Result Date: 07/14/2023 CLINICAL DATA:  Acute nonlocalized abdominal pain. Evaluate right branch portal vein thrombosis. Abdominal pain and not eating for the past 9 days. EXAM: CT ABDOMEN AND PELVIS WITH CONTRAST TECHNIQUE: Multidetector CT imaging of the abdomen and pelvis was performed using the standard protocol following bolus administration of intravenous contrast. RADIATION DOSE REDUCTION: This exam was performed according to the departmental dose-optimization program which includes automated exposure control, adjustment of the mA and/or kV according to patient size and/or use of iterative reconstruction technique. CONTRAST:  75mL OMNIPAQUE IOHEXOL 300 MG/ML  SOLN COMPARISON:  Ultrasound abdomen 07/14/2023. Abdominal radiograph 07/13/2023. CT 06/20/2023 FINDINGS: Lower chest: Emphysematous changes and scarring in the lung bases. Mild bronchiectasis. Hepatobiliary: There is focal decreased flow in the right anterior portal vein with zone of low-attenuation consistent with portal venous thrombosis. This corresponds to ultrasound finding and is new since prior study. The remaining portal veins are patent. Cholelithiasis. No bile duct dilatation. Stone or sludge suggested in the distal common bile duct. Pancreas: Since the previous study, there is interval development of low-attenuation change in the head of the pancreas and extending along the celiac axis around the hepatic artery. This is causing extrinsic narrowing of the hepatic artery. Given the short time frame during which this developed, it is most likely inflammatory. 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 Follow-up to resolution is recommended to exclude a rapidly developing neoplasm. The remainder of the pancreas appears otherwise normal. Spleen: Normal in size without focal abnormality. Adrenals/Urinary Tract: No adrenal gland nodules. Bilateral  hydronephrosis and hydroureter to the level of the bladder. No obstructing lesion is demonstrated. This could represent reflux or stasis. Bladder wall is diffusely thickened suggesting cystitis. Correlate with urinalysis. Stomach/Bowel: Stomach, small bowel, and colon are not abnormally distended. No wall thickening or inflammatory changes. Appendix is normal. Vascular/Lymphatic: Aortic atherosclerosis. No enlarged abdominal or pelvic lymph nodes. Surgical clips in the retroperitoneum. Reproductive: Status post hysterectomy. No adnexal masses. Other: No free air or free fluid in the abdomen. Abdominal wall musculature appears intact. Musculoskeletal: Degenerative changes in the spine. No destructive bone lesions. IMPRESSION: 1. Right anterior branch portal vein thrombosis with zone of low-attenuation in the anterior right lobe extending to the dome, likely representing hepatic infarct. This is new since prior study. 2. New development of masslike low-attenuation and infiltration around the head of the pancreas and extending to the celiac axis surrounding the hepatic artery. 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 Follow-up after resolution of acute process is recommended to exclude underlying neoplasm. 3. Cholelithiasis. Stone or sludge suggested in the distal common bile duct. No bile duct dilatation. 4. Emphysematous changes and scarring in the lung bases with mild bronchiectasis. 5. Bilateral hydronephrosis and hydroureter to the level of the bladder. No stone or obstructing lesion demonstrated. This may indicate reflux or stasis. 6. Diffuse bladder wall thickening may indicate bladder outlet obstruction or cystitis. Correlate with urinalysis. Electronically Signed   By: Burman Nieves M.D.   On: 07/14/2023 02:37   US  ABDOMEN LIMITED RUQ (LIVER/GB)  Result Date: 07/14/2023 CLINICAL DATA:  Elevated  liver function studies. Elevated lipase. Abdominal pain. EXAM: ULTRASOUND ABDOMEN LIMITED RIGHT UPPER QUADRANT COMPARISON:  CT 06/20/2023 FINDINGS: Gallbladder: Contracted gallbladder with stones and sludge. No wall thickening or edema. Murphy's sign is negative. Common bile duct: Diameter: 6 mm, normal Liver: No focal lesion identified. Within normal limits in parenchymal echogenicity. A posterior right portal venous branch demonstrates wall thickening with absent flow on color flow Doppler imaging, suggesting thrombosis. This was not present on the previous study. Portal veins in this area appear patent on the CT. Main portal vein is patent with flow in the appropriate direction. Other: None. IMPRESSION: 1. Cholelithiasis without evidence of acute cholecystitis. 2. Possible thrombosis of a right portal venous branch. This was not present on previous CT and could be artifact. The main portal vein is patent with flow in the appropriate direction. Electronically Signed   By: Burman Nieves M.D.   On: 07/14/2023 01:14   CT ABDOMEN PELVIS W CONTRAST  Result Date: 07/09/2023 CLINICAL DATA:  Cholelithiasis, colon cancer, abnormal liver enzymes. EXAM: CT ABDOMEN AND PELVIS WITH CONTRAST TECHNIQUE: Multidetector CT imaging of the abdomen and pelvis was performed using the standard protocol following bolus administration of intravenous contrast. RADIATION DOSE REDUCTION: This exam was performed according to the departmental dose-optimization program which includes automated exposure control, adjustment of the mA and/or kV according to patient size and/or use of iterative reconstruction technique. CONTRAST:  Oral contrast and 75mL ISOVUE-300 IOPAMIDOL (ISOVUE-300) INJECTION 61% COMPARISON:  05/03/2016. FINDINGS: Lower chest: Clear. No pleural or pericardial effusion. There are pacer wires. Atheromatous changes of the aorta and coronary arteries. Hepatobiliary: No focal liver abnormality is seen. No gallstones,  gallbladder wall thickening, or biliary dilatation. Pancreas: Unremarkable. No pancreatic ductal dilatation or surrounding inflammatory changes. Spleen: Normal in size without focal abnormality. Adrenals/Urinary Tract: Adrenal glands are unremarkable. Kidneys are normal, without renal calculi, focal lesion, or hydronephrosis. Bladder is unremarkable. Stomach/Bowel: Stomach is within normal limits. Appendix appears normal. No evidence of bowel wall thickening, distention, or inflammatory changes. Sigmoid colon anastomosis noted. No evidence of obstruction. Oral contrast progressed to the rectum. Vascular/Lymphatic: Aortic atherosclerosis. No enlarged abdominal or pelvic lymph nodes. Reproductive: Status post hysterectomy. No adnexal masses. Other: No abdominal wall hernia or abnormality. No abdominopelvic ascites. Musculoskeletal: Thoracolumbosacral degenerative changes. No acute osseous abnormalities. IMPRESSION: Unremarkable examination abdomen and pelvis. Electronically Signed   By: Layla Maw M.D.   On: 07/09/2023 10:12   CUP PACEART REMOTE DEVICE CHECK  Result Date: 06/27/2023 Scheduled remote reviewed. Normal device function.  Next remote 91 days. LA, CVRS   Microbiology: Results for orders placed or performed in visit on 07/13/23  Urine Culture     Status: None   Collection Time: 07/13/23 12:32 PM   Specimen: Urine  Result Value Ref Range Status   MICRO NUMBER: [id number]  Final   SPECIMEN QUALITY: Adequate  Final   Sample Source URINE  Final   STATUS: FINAL  Final   Result:   Final    Mixed genital flora isolated. These superficial bacteria are not indicative of a urinary tract infection. No further organism identification is warranted on this specimen. If clinically indicated, recollect clean-catch, mid-stream urine and transfer  immediately to Urine Culture Transport Tube.     Labs: CBC: Recent Labs  Lab 07/13/23 1253 07/13/23 1953 07/15/23 0410 07/16/23 0458  WBC 8.1 7.3  7.1 12.7*  NEUTROABS 6.4  --   --   --  HGB 11.4* 11.1* 9.8* 10.6*  HCT 35.0* 34.9* 30.7* 33.7*  MCV 89.3 90.6 90.3 92.3  PLT 316.0 297 270 256   Basic Metabolic Panel: Recent Labs  Lab 07/13/23 1253 07/13/23 1953 07/15/23 0410 07/16/23 0458  NA 131* 130* 137 136  K 5.5* 4.9 4.1 5.3*  CL 96 96* 107 105  CO2 28 24 24 22   GLUCOSE 303* 248* 79 93  BUN 26* 29* 16 21  CREATININE 1.21* 1.11* 0.97 1.19*  CALCIUM 9.1 8.9 7.8* 7.8*   Liver Function Tests: Recent Labs  Lab 07/13/23 1253 07/13/23 1953 07/15/23 0410 07/16/23 0458  AST 17 19 18 26   ALT 18 21 17 16   ALKPHOS 159* 146* 114 116  BILITOT 0.6 0.8 0.7 0.9  PROT 7.8 7.8 6.6 6.8  ALBUMIN 3.4* 3.0* 2.5* 2.6*   CBG: Recent Labs  Lab 07/16/23 0001 07/16/23 0441 07/16/23 0840 07/16/23 0952 07/16/23 1200  GLUCAP 169* 83 69* 178* 121*    Discharge time spent: greater than 30 minutes.  Signed: Delfino Lovett, MD [facility] 07/16/2023

## 2023-07-16 NOTE — H&P (Signed)
[facility] History and Physical  ADEA TRUPIA OAC:[id number] DOB: [birth year] DOA: 07/16/2023   PCP: Eustaquio Boyden, MD  Specialists: Seen by general surgery at [facility].  Seen by gastroenterology at [facility].  Followed by Dr. Ladona Ridgel with electrophysiology.  Chief Complaint: Abdominal pain  HPI: April Short is a 88 y.o. female with past medical history of coronary artery disease, chronic systolic CHF status post ICD/pacemaker who presented to the hospital with abdominal pain.  She initially presented to [facility].  She was found to have cholelithiasis with findings concerning for pancreatitis as well as portal vein thrombosis.  She was hospitalized at [facility].  She underwent robotic assisted laparoscopic cholecystectomy on 10/25.  Intraoperative cholangiogram revealed possible choledocholithiasis.  Because of her ICD/pacemaker she cannot undergo MRCP at [facility] which was what was recommended by gastroenterology and general surgery.  Hence she was transferred to [facility] for further management.  Patient currently complains of some abdominal pain.  Denies any nausea.  No chest pain or shortness of breath.  Home Medications: Prior to Admission medications   Medication Sig Start Date End Date Taking? Authorizing Provider  albuterol (PROVENTIL) (2.5 MG/3ML) 0.083% nebulizer solution Take 3 mLs (2.5 mg total) by nebulization 3 (three) times daily as needed for wheezing or shortness of breath. 12/08/21   Eustaquio Boyden, MD  aspirin EC 81 MG tablet Take 81 mg by mouth daily. Swallow whole.    [provider]  atorvastatin (LIPITOR) 40 MG tablet TAKE 1 TABLET BY MOUTH EVERY DAY 10/11/22   Eustaquio Boyden, MD  carvedilol (COREG) 25 MG tablet TAKE 1 TABLET (25 MG TOTAL) BY MOUTH TWICE A DAY WITH MEALS 09/17/22   Eustaquio Boyden, MD  Cholecalciferol (VITAMIN D3) 25 MCG (1000 UT) CAPS Take 1 capsule (1,000 Units total) by mouth daily. 07/02/22   Eustaquio Boyden, MD  ENTRESTO 49-51  MG TAKE 1 TABLET BY MOUTH TWICE A DAY 08/02/22   Runell Gess, MD  FARXIGA 5 MG TABS tablet TAKE 1 TABLET (5 MG TOTAL) BY MOUTH DAILY. 12/28/22   Eustaquio Boyden, MD  metoCLOPramide (REGLAN) 5 MG tablet Take 1 tablet (5 mg total) by mouth 3 (three) times daily as needed for nausea or vomiting (with meals). 07/13/23   Eustaquio Boyden, MD  PROLIA 60 MG/ML SOSY injection Inject 60 mg into the skin every 6 (six) months. 01/31/23   [provider]    Allergies: No Known Allergies  Past Medical History: Past Medical History:  Diagnosis Date   Anemia    Arthritis    CAD (coronary artery disease)    a. 10/2015 PCI [facility] [place], [place]): OM (2.5x30 Resolute DES), LCX (3.5x12 Resolute DES).   Cervical cancer (HCC) 2005   Chronic systolic CHF (congestive heart failure) (HCC)    a. 01/2016 Echo: EF 20-25%, diff HK, septal-lateral dyssynchrony. Mild MR, mod to sev dil LA, nl RV, mod dil RA, mod TR, PASP .   Colon cancer (HCC) 2001   DM (diabetes mellitus) (HCC)    History of chicken pox    HLD (hyperlipidemia)    Hypertensive heart disease    Ischemic cardiomyopathy    a. 01/2016 Echo: EF 20-25%.   LBBB (left bundle branch block)     Past Surgical History:  Procedure Laterality Date   COLON RESECTION  2001   colon cancer   COLONOSCOPY  11/2011   polyp x1, int hem ([place])   CORONARY STENT PLACEMENT     TOTAL ABDOMINAL HYSTERECTOMY  2005   cervical cancer    Social History: No smoking alcohol use or illicit drug use.  Family History:  Family History  Problem Relation Age of Onset   Colon cancer Mother    Diabetes Sister    Stomach cancer Son    Breast cancer Neg Hx      Review of Systems - History obtained from the patient General ROS: positive for  - fatigue Psychological ROS: negative Ophthalmic ROS: negative ENT ROS: negative Allergy and Immunology ROS: negative Hematological and Lymphatic ROS: negative Endocrine ROS: negative Respiratory ROS:  no cough, shortness of breath, or wheezing Cardiovascular ROS: no chest pain or dyspnea on exertion Gastrointestinal ROS: As in HPI Genito-Urinary ROS: no dysuria, trouble voiding, or hematuria Musculoskeletal ROS: negative Neurological ROS: no TIA or stroke symptoms Dermatological ROS: negative  Physical Examination Temperature 98.3.  Blood pressure 112/45.  Heart rate 69 bpm.  Respiratory 17.  Saturation 94% on room air.  General appearance: alert, cooperative, appears stated age, and no distress Head: Normocephalic, without obvious abnormality, atraumatic Eyes: conjunctivae/corneas clear. PERRL, EOM's intact.  Throat: lips, mucosa, and tongue normal; teeth and gums normal Resp: clear to auscultation bilaterally Cardio: regular rate and rhythm, S1, S2 normal, no murmur, click, rub or gallop GI: Abdomen soft.  JP drain is noted.  Diffusely tender minimally without any rebound rigidity or guarding.  Bowel sounds sluggish. Extremities: extremities normal, atraumatic, no cyanosis or edema Pulses: 2+ and symmetric Skin: Skin color, texture, turgor normal. No rashes or lesions Lymph nodes: Cervical, supraclavicular, and axillary nodes normal. Neurologic: Moving all of her extremities.  Equal strength.  No obvious focal neurological deficits.   Labs on Admission: I have personally reviewed following labs and imaging studies  CBC: Recent Labs  Lab 07/13/23 1253 07/13/23 1953 07/15/23 0410 07/16/23 0458  WBC 8.1 7.3 7.1 12.7*  NEUTROABS 6.4  --   --   --   HGB 11.4* 11.1* 9.8* 10.6*  HCT 35.0* 34.9* 30.7* 33.7*  MCV 89.3 90.6 90.3 92.3  PLT 316.0 297 270 256   Basic Metabolic Panel: Recent Labs  Lab 07/13/23 1253 07/13/23 1953 07/15/23 0410 07/16/23 0458  NA 131* 130* 137 136  K 5.5* 4.9 4.1 5.3*  CL 96 96* 107 105  CO2 28 24 24 22   GLUCOSE 303* 248* 79 93  BUN 26* 29* 16 21  CREATININE 1.21* 1.11* 0.97 1.19*  CALCIUM 9.1 8.9 7.8* 7.8*   GFR: Estimated Creatinine  Clearance: 24.4 mL/min (A) (by C-G formula based on SCr of 1.19 mg/dL (H)). Liver Function Tests: Recent Labs  Lab 07/13/23 1253 07/13/23 1953 07/15/23 0410 07/16/23 0458  AST 17 19 18 26   ALT 18 21 17 16   ALKPHOS 159* 146* 114 116  BILITOT 0.6 0.8 0.7 0.9  PROT 7.8 7.8 6.6 6.8  ALBUMIN 3.4* 3.0* 2.5* 2.6*   Recent Labs  Lab 07/13/23 1253 07/13/23 1953  LIPASE 658.0* 384*    Coagulation Profile: Recent Labs  Lab 07/14/23 0300  INR 1.1    CBG: Recent Labs  Lab 07/16/23 0001 07/16/23 0441 07/16/23 0840 07/16/23 0952 07/16/23 1200  GLUCAP 169* 83 69* 178* 121*    Radiological Exams on Admission: DG Cholangiogram Operative  Result Date: 07/16/2023 CLINICAL DATA:  Intraoperative cholangiogram during cholecystectomy EXAM: INTRAOPERATIVE CHOLANGIOGRAM TECHNIQUE: Cholangiographic images from the C-arm fluoroscopic device were submitted for interpretation post-operatively. Please see the procedural report for the amount of contrast and the fluoroscopy time utilized. FLUOROSCOPY: Radiation Exposure Index (as  provided by the fluoroscopic device): 10.2 mGy Kerma COMPARISON:  CT abdomen pelvis 06/20/2023 FINDINGS: Four intraoperative fluoroscopic images and 1 cine clip were provided for interpretation. The submitted images demonstrate opacification of the intra and extrahepatic bile ducts through cystic duct remnant. Multiple filling defects in the distal common bile duct are most likely due to choledocholithiasis. IMPRESSION: Multiple filling defects seen in the distal common bile duct most likely due to choledocholithiasis. Correlation with MRCP or ERCP would be beneficial to exclude underlying mass. Review of most recent abdominal CT shows a duodenal diverticulum at the insertion of the common bile duct which may make ERCP challenging. Electronically Signed   By: Acquanetta Belling M.D.   On: 07/16/2023 06:17   DG C-Arm 1-60 Min-No Report  Result Date: 07/15/2023 Fluoroscopy was  utilized by the requesting physician.  No radiographic interpretation.   DG C-Arm 1-60 Min-No Report  Result Date: 07/15/2023 Fluoroscopy was utilized by the requesting physician.  No radiographic interpretation.    My interpretation of Electrocardiogram: EKG from 10/23 reveals sinus rhythm with intraventricular conduction delay.  Normal axis.   Problem List  Principal Problem:   Choledocholithiasis Active Problems:   Portal vein thrombosis   Cholelithiasis   Type 2 diabetes mellitus with peripheral neuropathy (HCC)   Ischemic cardiomyopathy   CAD S/P percutaneous coronary angioplasty   ICD (implantable cardioverter-defibrillator) in place   MCI (mild cognitive impairment) with memory loss   CKD stage 3 due to type 2 diabetes mellitus (HCC)   Anemia in chronic kidney disease (CKD)   Acute gallstone pancreatitis   Assessment: This is a 88 year old female with past medical history as stated earlier who presented with abdominal pain and was found to have cholelithiasis, gallstone pancreatitis and portal vein thrombosis.  She underwent laparoscopic cholecystectomy at [facility] and intraoperative cholangiogram revealed choledocholithiasis.  Due to presence of ICD/pacemaker she could not undergo MRCP at [facility] and was transferred to [facility].  Plan:  #1. Gallstone pancreatitis/cholelithiasis/suspected choledocholithiasis: MRCP will be ordered.  This will need to be coordinated with the electrophysiology team due to presence of ICD.  Patient was seen by gastroenterology and general surgery at [facility].  Depending on findings on the MRCP we may need to involve gastroenterology if she needs ERCP.  She does have a JP drain.  We will eventually need to discuss her care with the general surgery team as well. LFTs were unremarkable earlier this morning including normal alkaline phosphatase as well as normal bilirubin. Her lipase was initially elevated at 658 with improvement to 384.  #2. Portal  vein thrombosis: Seen on imaging studies.  This was discussed with vascular surgery at [facility] who recommended oral anticoagulation for 6 months.  Currently on IV heparin which will be reinitiated here as well.  #3.  Chronic systolic CHF: Her EF used to be 20 to 25% based on echo in 2017.  Echocardiogram from December 2023 showed recovery in her systolic function to 50 to 55%.  She is currently she is euvolemic.  She is noted to be on carvedilol and Entresto and [place] prior to admission.  These are currently on hold.  #4.  Chronic kidney disease stage IIIb: Mild hyperkalemia noted this morning.  Will recheck labs tomorrow.  Renal function seems to be close to baseline.  #5.  Coronary artery disease status post PCI: Cardiac status is stable.  No chest pain.  #6.  Diabetes mellitus type 2 with peripheral neuropathy: Monitor CBGs.  SSI.  #7.  Normocytic  anemia: No evidence of overt bleeding.  Monitor daily for now.   DVT Prophylaxis: On IV heparin Code Status: Full code Family Communication: Will update family members Disposition: To be determined.  Possibly return home when improved Consults called: None yet Admission Status: Status is: Inpatient Remains inpatient appropriate because: Portal vein thrombosis, pancreatitis    Severity of Illness: The appropriate patient status for this patient is INPATIENT. Inpatient status is judged to be reasonable and necessary in order to provide the required intensity of service to ensure the patient's safety. The patient's presenting symptoms, physical exam findings, and initial radiographic and laboratory data in the context of their chronic comorbidities is felt to place them at high risk for further clinical deterioration. Furthermore, it is not anticipated that the patient will be medically stable for discharge from the hospital within 2 midnights of admission.   * I certify that at the point of admission it is my clinical judgment that the patient  will require inpatient hospital care spanning beyond 2 midnights from the point of admission due to high intensity of service, high risk for further deterioration and high frequency of surveillance required.*   Further management decisions will depend on results of further testing and patient's response to treatment.   Shaurya Rawdon [employer]  [facility] Web designer on [email or web address]  07/16/2023, 5:25 PM

## 2023-07-16 NOTE — Progress Notes (Signed)
Contacted and reported to [place], Charity fundraiser at [employer]. Patient waiting for [facility] transportation to [facility].

## 2023-07-17 ENCOUNTER — Encounter (HOSPITAL_COMMUNITY): Payer: Self-pay | Admitting: Internal Medicine

## 2023-07-17 DIAGNOSIS — D631 Anemia in chronic kidney disease: Secondary | ICD-10-CM

## 2023-07-17 DIAGNOSIS — K8050 Calculus of bile duct without cholangitis or cholecystitis without obstruction: Secondary | ICD-10-CM | POA: Diagnosis not present

## 2023-07-17 DIAGNOSIS — N1831 Chronic kidney disease, stage 3a: Secondary | ICD-10-CM | POA: Diagnosis not present

## 2023-07-17 DIAGNOSIS — E1142 Type 2 diabetes mellitus with diabetic polyneuropathy: Secondary | ICD-10-CM | POA: Diagnosis not present

## 2023-07-17 DIAGNOSIS — I81 Portal vein thrombosis: Secondary | ICD-10-CM | POA: Diagnosis not present

## 2023-07-17 LAB — URINALYSIS, ROUTINE W REFLEX MICROSCOPIC
Bilirubin Urine: NEGATIVE
Glucose, UA: 150 mg/dL — AB
Ketones, ur: 20 mg/dL — AB
Nitrite: NEGATIVE
Protein, ur: NEGATIVE mg/dL
Specific Gravity, Urine: 1.015 (ref 1.005–1.030)
pH: 5 (ref 5.0–8.0)

## 2023-07-17 LAB — GLUCOSE, CAPILLARY
Glucose-Capillary: 102 mg/dL — ABNORMAL HIGH (ref 70–99)
Glucose-Capillary: 84 mg/dL (ref 70–99)
Glucose-Capillary: 80 mg/dL (ref 70–99)
Glucose-Capillary: 108 mg/dL — ABNORMAL HIGH (ref 70–99)
Glucose-Capillary: 126 mg/dL — ABNORMAL HIGH (ref 70–99)
Glucose-Capillary: 172 mg/dL — ABNORMAL HIGH (ref 70–99)
Glucose-Capillary: 162 mg/dL — ABNORMAL HIGH (ref 70–99)

## 2023-07-17 LAB — COMPREHENSIVE METABOLIC PANEL
ALT: 13 U/L (ref 0–44)
AST: 21 U/L (ref 15–41)
Albumin: 2.4 g/dL — ABNORMAL LOW (ref 3.5–5.0)
Alkaline Phosphatase: 107 U/L (ref 38–126)
Anion gap: 13 (ref 5–15)
BUN: 17 mg/dL (ref 8–23)
CO2: 21 mmol/L — ABNORMAL LOW (ref 22–32)
Calcium: 8.1 mg/dL — ABNORMAL LOW (ref 8.9–10.3)
Chloride: 102 mmol/L (ref 98–111)
Creatinine, Ser: 1.09 mg/dL — ABNORMAL HIGH (ref 0.44–1.00)
GFR, Estimated: 49 mL/min — ABNORMAL LOW (ref >60)
Glucose, Bld: 82 mg/dL (ref 70–99)
Potassium: 4.4 mmol/L (ref 3.5–5.1)
Sodium: 136 mmol/L (ref 135–145)
Total Bilirubin: 0.9 mg/dL (ref 0.3–1.2)
Total Protein: 6.8 g/dL (ref 6.5–8.1)

## 2023-07-17 LAB — CBC
HCT: 33.9 % — ABNORMAL LOW (ref 36.0–46.0)
Hemoglobin: 10.5 g/dL — ABNORMAL LOW (ref 12.0–15.0)
MCH: 28.4 pg (ref 26.0–34.0)
MCHC: 31 g/dL (ref 30.0–36.0)
MCV: 91.6 fL (ref 80.0–100.0)
Platelets: 232 10*3/uL (ref 150–400)
RBC: 3.7 MIL/uL — ABNORMAL LOW (ref 3.87–5.11)
RDW: 14.5 % (ref 11.5–15.5)
WBC: 7.8 10*3/uL (ref 4.0–10.5)
nRBC: 0 % (ref 0.0–0.2)

## 2023-07-17 LAB — HEPARIN LEVEL (UNFRACTIONATED): Heparin Unfractionated: 0.47 [IU]/mL (ref 0.30–0.70)

## 2023-07-17 LAB — LIPASE, BLOOD: Lipase: 63 U/L — ABNORMAL HIGH (ref 11–51)

## 2023-07-17 LAB — PROTIME-INR
INR: 1 (ref 0.8–1.2)
Prothrombin Time: 13.7 s (ref 11.4–15.2)

## 2023-07-17 MED ORDER — SODIUM CHLORIDE 0.9 % IV SOLN
1.0000 g | INTRAVENOUS | Status: DC
  Administered 2023-07-17 – 2023-07-18 (×2): 1 g via INTRAVENOUS
  Filled 2023-07-17 (×2): qty 10

## 2023-07-17 NOTE — Progress Notes (Signed)
Mobility Specialist: Progress Note   07/17/23 1611  Mobility  Activity Ambulated with assistance in room  Level of Assistance Contact guard assist, steadying assist  Assistive Device None  Distance Ambulated (ft) 45 ft  Activity Response Tolerated well  Mobility Referral Yes  $Mobility charge 1 Mobility  Mobility Specialist Start Time (ACUTE ONLY) 1441  Mobility Specialist Stop Time (ACUTE ONLY) 1451  Mobility Specialist Time Calculation (min) (ACUTE ONLY) 10 min    Pt was agreeable to mobility session - received in bed. Daughter assisted with translating. MinA for bed mobility, CG for STS and ambulation. Denies any pain, weakness, or dizziness. Left in bed with all needs met, call bell in reach. Family in room.   Maurene Capes Mobility Specialist Please contact via SecureChat or Rehab office at [phone]

## 2023-07-17 NOTE — Progress Notes (Signed)
PHARMACY - ANTICOAGULATION CONSULT NOTE  Pharmacy Consult for heparin Indication:  portal vein thrombosis   No Known Allergies  Patient Measurements:   Heparin Dosing Weight: 52.8 kg   Vital Signs: Temp: 98.5 F (36.9 C) (10/27 0809) Temp Source: Oral (10/27 0809) BP: 134/62 (10/27 0809) Pulse Rate: 69 (10/27 0809)  Labs: Recent Labs    07/14/23 2130 07/15/23 0410 07/15/23 0410 07/16/23 0458 07/17/23 0825  HGB  --  9.8*   < > 10.6* 10.5*  HCT  --  30.7*  --  33.7* 33.9*  PLT  --  270  --  256 232  LABPROT  --   --   --   --  13.7  INR  --   --   --   --  1.0  HEPARINUNFRC 0.43 0.40  --   --  0.47  CREATININE  --  0.97  --  1.19* 1.09*   < > = values in this interval not displayed.    Estimated Creatinine Clearance: 26.6 mL/min (A) (by C-G formula based on SCr of 1.09 mg/dL (H)).   Medical History: Past Medical History:  Diagnosis Date   Anemia    Arthritis    CAD (coronary artery disease)    a. 10/2015 PCI [facility], [place], [place]): OM (2.5x30 Resolute DES), LCX (3.5x12 Resolute DES).   Cervical cancer (HCC) 2005   Chronic systolic CHF (congestive heart failure) (HCC)    a. 01/2016 Echo: EF 20-25%, diff HK, septal-lateral dyssynchrony. Mild MR, mod to sev dil LA, nl RV, mod dil RA, mod TR, PASP .   Colon cancer (HCC) 2001   DM (diabetes mellitus) (HCC)    History of chicken pox    HLD (hyperlipidemia)    Hypertensive heart disease    Ischemic cardiomyopathy    a. 01/2016 Echo: EF 20-25%.   LBBB (left bundle branch block)     Medications:  Scheduled:   insulin aspart  0-6 Units Subcutaneous TID WC   LORazepam  0.5 mg Intravenous Once   pantoprazole (PROTONIX) IV  40 mg Intravenous Q24H   Infusions:   heparin 900 Units/hr (07/16/23 1919)   PRN: acetaminophen **OR** acetaminophen, HYDROmorphone (DILAUDID) injection, ondansetron **OR** ondansetron (ZOFRAN) IV, oxyCODONE  Assessment: Patient was on heparin infusion at [facility] for portal vein  thrombosis. Upon transfer to [facility], pharmacy was consulted to restart heparin. Resumed 900 u/hr dose due to stability at [facility]. CBC wnl today.  Levels: 10/27 0825: AM daily level: 0.47  Goal of Therapy:  Heparin level 0.3-0.7 units/ml Monitor platelets by anticoagulation protocol: Yes   Plan:  Continue heparin infusion at 900 units/hr Daily heparin level and CBC  Lora Paula, PharmD PGY-2 Infectious Diseases Pharmacy Resident 07/17/2023 9:36 AM

## 2023-07-17 NOTE — Progress Notes (Addendum)
[facility] PROGRESS NOTE   April Short:[id number] DOB: 08/14/1935 DOA: 07/16/2023  PCP: Eustaquio Boyden, MD  Brief History:  88 y.o. female with past medical history of coronary artery disease, chronic systolic CHF status post ICD/pacemaker who presented to the hospital with abdominal pain.  She initially presented to [facility].  She was found to have cholelithiasis with findings concerning for pancreatitis as well as portal vein thrombosis.  She was hospitalized at [facility].  She underwent robotic assisted laparoscopic cholecystectomy on 10/25.  Intraoperative cholangiogram revealed possible choledocholithiasis.  Because of her ICD/pacemaker she cannot undergo MRCP at [facility] which was what was recommended by gastroenterology and general surgery.  Hence she was transferred to [facility] for further management.   Consultants: None yet.  Seen by gastroenterology and general surgery at [facility]  Procedures: Robotic laparoscopic cholecystectomy on 10/25 at [facility]    Subjective/Interval History: Patient granddaughter was at the bedside and was able to interpret.  Patient denies any abdominal pain.  Has been urinating.  No nausea vomiting.    Assessment/Plan:   Gallstone pancreatitis/cholelithiasis/suspected choledocholithiasis Patient was seen by gastroenterology and general surgery at [facility].  She is status post laparoscopic cholecystectomy.  She has a JP drain. LFTs were unremarkable yesterday.  Labs are pending today. Her lipase was initially elevated at 658 with improvement to 384.  Await level from this morning. MRCP has been ordered and is pending.  Will need to be coordinated with EP due to presence of ICD. Mostly asymptomatic at this time.   Portal vein thrombosis Seen on imaging studies.  This was discussed with vascular surgery at [facility] who recommended oral anticoagulation for 6 months.  Currently on IV heparin.   Chronic systolic CHF Her EF used to be 20 to 25% based  on echo in 2017.  Echocardiogram from December 2023 showed recovery in her systolic function to 50 to 55%.   She was noted to be on carvedilol and Entresto and [place] prior to admission.  These are currently on hold. Volume status is stable.   Chronic kidney disease stage IIIb Renal function stable.  Mild hyperkalemia was noted yesterday.  Labs are pending from today.  Monitor urine output.  Urinary bladder wall thickening/bilateral hydronephrosis This was noted on CT scan done on 10/24.  Patient denies any issues with urinating but is tender in suprapubic area.  UA was noted to be abnormal from 10/23 however culture did not show any growth.   Bladder scan without retention. UA abnormal. Will send repeat c/s. Treat with ceftriaxone for now.   Coronary artery disease status post PCI Cardiac status is stable.  No chest pain.   Diabetes mellitus type 2 with peripheral neuropathy Monitor CBGs.  SSI.   Normocytic anemia/leukocytosis No evidence of overt bleeding.  Monitor daily for now. Monitor WBC.  Noted to be afebrile.  Could be reactive after surgery.   DVT Prophylaxis:  on IV heparin Code Status: Full code Family Communication: Discussed with granddaughter Disposition Plan: Hopefully return home when improved  Status is: Inpatient Remains inpatient appropriate because: Need for MRCP, IV heparin      Medications: Scheduled:  insulin aspart  0-6 Units Subcutaneous TID WC   LORazepam  0.5 mg Intravenous Once   pantoprazole (PROTONIX) IV  40 mg Intravenous Q24H   Continuous:  heparin 900 Units/hr (07/16/23 1919)   WJX:[id number] **OR** acetaminophen, HYDROmorphone (DILAUDID) injection, ondansetron **OR** ondansetron (ZOFRAN) IV, oxyCODONE  Antibiotics: Anti-infectives (From admission, onward)    None  Objective:  Vital Signs  Vitals:   07/16/23 2122 07/17/23 0031 07/17/23 0519 07/17/23 0809  BP: (!) 124/45 (!) 123/41 (!) 134/49 134/62  Pulse: 72 73 73  69  Resp: 17 17 16 16   Temp: 98.6 F (37 C) 98.1 F (36.7 C) 98.2 F (36.8 C) 98.5 F (36.9 C)  TempSrc:    Oral  SpO2: 95% 95% 93% 92%    Intake/Output Summary (Last 24 hours) at 07/17/2023 8295 Last data filed at 07/17/2023 0600 Gross per 24 hour  Intake 334.75 ml  Output 20 ml  Net 314.75 ml   There were no vitals filed for this visit.  General appearance: Awake alert.  In no distress Resp: Clear to auscultation bilaterally.  Normal effort Cardio: S1-S2 is normal regular.  No S3-S4.  No rubs murmurs or bruit GI: Abdomen is soft.  Drain is noted.  Mildly tender in the suprapubic area. Extremities: No edema.  Able to move all of her extremities Neurologic: No focal neurological deficits.    Lab Results:  Data Reviewed: I have personally reviewed following labs and reports of the imaging studies  CBC: Recent Labs  Lab 07/13/23 1253 07/13/23 1953 07/15/23 0410 07/16/23 0458  WBC 8.1 7.3 7.1 12.7*  NEUTROABS 6.4  --   --   --   HGB 11.4* 11.1* 9.8* 10.6*  HCT 35.0* 34.9* 30.7* 33.7*  MCV 89.3 90.6 90.3 92.3  PLT 316.0 297 270 256    Basic Metabolic Panel: Recent Labs  Lab 07/13/23 1253 07/13/23 1953 07/15/23 0410 07/16/23 0458  NA 131* 130* 137 136  K 5.5* 4.9 4.1 5.3*  CL 96 96* 107 105  CO2 28 24 24 22   GLUCOSE 303* 248* 79 93  BUN 26* 29* 16 21  CREATININE 1.21* 1.11* 0.97 1.19*  CALCIUM 9.1 8.9 7.8* 7.8*    GFR: Estimated Creatinine Clearance: 24.4 mL/min (A) (by C-G formula based on SCr of 1.19 mg/dL (H)).  Liver Function Tests: Recent Labs  Lab 07/13/23 1253 07/13/23 1953 07/15/23 0410 07/16/23 0458  AST 17 19 18 26   ALT 18 21 17 16   ALKPHOS 159* 146* 114 116  BILITOT 0.6 0.8 0.7 0.9  PROT 7.8 7.8 6.6 6.8  ALBUMIN 3.4* 3.0* 2.5* 2.6*    Recent Labs  Lab 07/13/23 1253 07/13/23 1953  LIPASE 658.0* 384*   Coagulation Profile: Recent Labs  Lab 07/14/23 0300  INR 1.1    CBG: Recent Labs  Lab 07/16/23 1744 07/16/23 2124  07/17/23 0037 07/17/23 0524 07/17/23 0807  GLUCAP 78 93 102* 84 80    Recent Results (from the past 240 hour(s))  Urine Culture     Status: None   Collection Time: 07/13/23 12:32 PM   Specimen: Urine  Result Value Ref Range Status   MICRO NUMBER: [id number]  Final   SPECIMEN QUALITY: Adequate  Final   Sample Source URINE  Final   STATUS: FINAL  Final   Result:   Final    Mixed genital flora isolated. These superficial bacteria are not indicative of a urinary tract infection. No further organism identification is warranted on this specimen. If clinically indicated, recollect clean-catch, mid-stream urine and transfer  immediately to Urine Culture Transport Tube.       Radiology Studies: DG Cholangiogram Operative  Result Date: 07/16/2023 CLINICAL DATA:  Intraoperative cholangiogram during cholecystectomy EXAM: INTRAOPERATIVE CHOLANGIOGRAM TECHNIQUE: Cholangiographic images from the C-arm fluoroscopic device were submitted for interpretation post-operatively. Please see the procedural report for the amount of  contrast and the fluoroscopy time utilized. FLUOROSCOPY: Radiation Exposure Index (as provided by the fluoroscopic device): 10.2 mGy Kerma COMPARISON:  CT abdomen pelvis 06/20/2023 FINDINGS: Four intraoperative fluoroscopic images and 1 cine clip were provided for interpretation. The submitted images demonstrate opacification of the intra and extrahepatic bile ducts through cystic duct remnant. Multiple filling defects in the distal common bile duct are most likely due to choledocholithiasis. IMPRESSION: Multiple filling defects seen in the distal common bile duct most likely due to choledocholithiasis. Correlation with MRCP or ERCP would be beneficial to exclude underlying mass. Review of most recent abdominal CT shows a duodenal diverticulum at the insertion of the common bile duct which may make ERCP challenging. Electronically Signed   By: Acquanetta Belling M.D.   On: 07/16/2023 06:17    DG C-Arm 1-60 Min-No Report  Result Date: 07/15/2023 Fluoroscopy was utilized by the requesting physician.  No radiographic interpretation.   DG C-Arm 1-60 Min-No Report  Result Date: 07/15/2023 Fluoroscopy was utilized by the requesting physician.  No radiographic interpretation.       LOS: 1 day   Shontez Sermon [employer] on [email or web address]  07/17/2023, 9:03 AM

## 2023-07-17 NOTE — Progress Notes (Signed)
Pt experienced chills after receiving first dose of Rocephin. Vitals stable, blood glucose 126. Called  pharmacy spoke with Harrold Donath, pharmacist, to inquire about possible allergic reaction to Rocephin. Per Harrold Donath chills is not a typical allergic reaction. Advised to notify MD, make a note and to continue to monitor the pt. Dr. Osvaldo Shipper notified.   07/17/23 1529  Vitals  Temp 98.8 F (37.1 C)  Temp Source Oral  BP (!) 150/71  MAP (mmHg) 92  BP Location Left Arm  BP Method Automatic  Patient Position (if appropriate) Lying  Pulse Rate 86  Pulse Rate Source Monitor  Resp 18  MEWS COLOR  MEWS Score Color Green  Oxygen Therapy  SpO2 90 %  O2 Device Room Air  MEWS Score  MEWS Temp 0  MEWS Systolic 0  MEWS Pulse 0  MEWS RR 0  MEWS LOC 0  MEWS Score 0

## 2023-07-17 NOTE — Plan of Care (Signed)
  Problem: Pain Management: Goal: General experience of comfort will improve Outcome: Progressing   Problem: Safety: Goal: Ability to remain free from injury will improve Outcome: Progressing

## 2023-07-17 NOTE — Consult Note (Addendum)
Reason for Consult: choledocholithiasis  Referring Physician: Dr. Osvaldo Shipper, [facility]  April Short is an 88 y.o. female.  HPI: Patient is an 88 yo female who underwent robotic cholecystectomy with cholangiography on 07/15/2023 at [facility] by Dr. Tonna Boehringer.  IOC notable for multiple filling defects in the CBD.  Due to cardiac hx patient transferred to [facility] for probable ERCP with stone extraction.  General surgery consulted to assist with post op management.  Granddaughter at bedside - acts as Nurse, learning disability.  Past Medical History:  Diagnosis Date   Anemia    Arthritis    CAD (coronary artery disease)    a. 10/2015 PCI [facility], [place], [place]): OM (2.5x30 Resolute DES), LCX (3.5x12 Resolute DES).   Cervical cancer (HCC) 2005   Chronic systolic CHF (congestive heart failure) (HCC)    a. 01/2016 Echo: EF 20-25%, diff HK, septal-lateral dyssynchrony. Mild MR, mod to sev dil LA, nl RV, mod dil RA, mod TR, PASP .   Colon cancer (HCC) 2001   DM (diabetes mellitus) (HCC)    History of chicken pox    HLD (hyperlipidemia)    Hypertensive heart disease    Ischemic cardiomyopathy    a. 01/2016 Echo: EF 20-25%.   LBBB (left bundle branch block)     Past Surgical History:  Procedure Laterality Date   COLON RESECTION  2001   colon cancer   COLONOSCOPY  11/2011   polyp x1, int hem ([place])   CORONARY STENT PLACEMENT     TOTAL ABDOMINAL HYSTERECTOMY  2005   cervical cancer    Family History  Problem Relation Age of Onset   Colon cancer Mother    Diabetes Sister    Stomach cancer Son    Breast cancer Neg Hx     Social History:  reports that she has never smoked. She has never used smokeless tobacco. She reports that she does not currently use alcohol. She reports that she does not use drugs.  Allergies: No Known Allergies  Medications: I have reviewed the patient's current medications.  Results for orders placed or performed during the hospital  encounter of 07/16/23 (from the past 48 hour(s))  Glucose, capillary     Status: None   Collection Time: 07/16/23  5:44 PM  Result Value Ref Range   Glucose-Capillary 78 70 - 99 mg/dL    Comment: Glucose reference range applies only to samples taken after fasting for at least 8 hours.  Glucose, capillary     Status: None   Collection Time: 07/16/23  9:24 PM  Result Value Ref Range   Glucose-Capillary 93 70 - 99 mg/dL    Comment: Glucose reference range applies only to samples taken after fasting for at least 8 hours.  Glucose, capillary     Status: Abnormal   Collection Time: 07/17/23 12:37 AM  Result Value Ref Range   Glucose-Capillary 102 (H) 70 - 99 mg/dL    Comment: Glucose reference range applies only to samples taken after fasting for at least 8 hours.  Glucose, capillary     Status: None   Collection Time: 07/17/23  5:24 AM  Result Value Ref Range   Glucose-Capillary 84 70 - 99 mg/dL    Comment: Glucose reference range applies only to samples taken after fasting for at least 8 hours.  Glucose, capillary     Status: None   Collection Time: 07/17/23  8:07 AM  Result Value Ref Range   Glucose-Capillary 80 70 -  99 mg/dL    Comment: Glucose reference range applies only to samples taken after fasting for at least 8 hours.  CBC     Status: Abnormal   Collection Time: 07/17/23  8:25 AM  Result Value Ref Range   WBC 7.8 4.0 - 10.5 K/uL   RBC 3.70 (L) 3.87 - 5.11 MIL/uL   Hemoglobin 10.5 (L) 12.0 - 15.0 g/dL   HCT 16.1 (L) 09.6 - 04.5 %   MCV 91.6 80.0 - 100.0 fL   MCH 28.4 26.0 - 34.0 pg   MCHC 31.0 30.0 - 36.0 g/dL   RDW 40.9 81.1 - 91.4 %   Platelets 232 150 - 400 K/uL   nRBC 0.0 0.0 - 0.2 %    Comment: Performed at [facility], [address], [place], [place] [address]  Heparin level (unfractionated)     Status: None   Collection Time: 07/17/23  8:25 AM  Result Value Ref Range   Heparin Unfractionated 0.47 0.30 - 0.70 IU/mL    Comment: (NOTE) The clinical  reportable range upper limit is being lowered to >1.10 to align with the FDA approved guidance for the current laboratory assay.  If heparin results are below expected values, and patient dosage has  been confirmed, suggest follow up testing of antithrombin III levels. Performed at [facility], [address], [place], [place] [address]     DG Cholangiogram Operative  Result Date: 07/16/2023 CLINICAL DATA:  Intraoperative cholangiogram during cholecystectomy EXAM: INTRAOPERATIVE CHOLANGIOGRAM TECHNIQUE: Cholangiographic images from the C-arm fluoroscopic device were submitted for interpretation post-operatively. Please see the procedural report for the amount of contrast and the fluoroscopy time utilized. FLUOROSCOPY: Radiation Exposure Index (as provided by the fluoroscopic device): 10.2 mGy Kerma COMPARISON:  CT abdomen pelvis 06/20/2023 FINDINGS: Four intraoperative fluoroscopic images and 1 cine clip were provided for interpretation. The submitted images demonstrate opacification of the intra and extrahepatic bile ducts through cystic duct remnant. Multiple filling defects in the distal common bile duct are most likely due to choledocholithiasis. IMPRESSION: Multiple filling defects seen in the distal common bile duct most likely due to choledocholithiasis. Correlation with MRCP or ERCP would be beneficial to exclude underlying mass. Review of most recent abdominal CT shows a duodenal diverticulum at the insertion of the common bile duct which may make ERCP challenging. Electronically Signed   By: Acquanetta Belling M.D.   On: 07/16/2023 06:17   DG C-Arm 1-60 Min-No Report  Result Date: 07/15/2023 Fluoroscopy was utilized by the requesting physician.  No radiographic interpretation.   DG C-Arm 1-60 Min-No Report  Result Date: 07/15/2023 Fluoroscopy was utilized by the requesting physician.  No radiographic interpretation.    Review of Systems  Constitutional: Negative.   HENT:  Negative.    Eyes: Negative.   Respiratory: Negative.    Cardiovascular: Negative.   Gastrointestinal:  Positive for abdominal pain.  Endocrine: Negative.   Genitourinary: Negative.   Musculoskeletal: Negative.   Skin: Negative.   Allergic/Immunologic: Negative.   Neurological: Negative.   Hematological: Negative.   Psychiatric/Behavioral: Negative.      Blood pressure 134/62, pulse 69, temperature 98.5 F (36.9 C), temperature source Oral, resp. rate 16, SpO2 92%.  CONSTITUTIONAL: no acute distress; conversant; no obvious deformities  EYES: Conjunctiva clear and moist; pupils equal bilaterally  NECK: trachea midline; no thyroid nodularity  GI: abdomen is soft without distension; wounds dry and intact; JP with thin serosanguinous output  MSK: normal range of motion of extremities; no  clubbing; no cyanosis   Assessment/Plan: Gallstone pancreatitis/cholelithiasis/suspected choledocholithiasis - status post lap chole with IOC at [facility], Dr. Tonna Boehringer - lipase was initially elevated at 658 with improvement to 384.  Await level from this morning. - MRCP has been ordered and is pending.  Coordinate with EP due to ICD.   Portal vein thrombosis Seen on imaging studies.  This was discussed with vascular surgery at [facility] who recommended oral anticoagulation for 6 months.  Currently on IV heparin.   Chronic systolic CHF -systolic function to 50 to 55%.   She was noted to be on carvedilol and Entresto and [place] prior to admission.  These are currently on hold. Volume status is stable.   Chronic kidney disease stage IIIb Renal function stable   Diabetes mellitus type 2 with peripheral neuropathy  Patient seen and examined on [address].  Granddaughter at bedside to interpret.  OK to allow clear liquid diet.  Will order.  Await consultation by GI and cardiology.  Will follow with you.  Darnell Level, MD [facility] practice Office:  [phone]   Darnell Level 07/17/2023, 9:19 AM

## 2023-07-17 NOTE — Plan of Care (Signed)

## 2023-07-18 ENCOUNTER — Encounter: Payer: Self-pay | Admitting: Surgery

## 2023-07-18 ENCOUNTER — Inpatient Hospital Stay (HOSPITAL_COMMUNITY): Payer: 59

## 2023-07-18 DIAGNOSIS — R748 Abnormal levels of other serum enzymes: Secondary | ICD-10-CM

## 2023-07-18 DIAGNOSIS — K8050 Calculus of bile duct without cholangitis or cholecystitis without obstruction: Secondary | ICD-10-CM | POA: Diagnosis not present

## 2023-07-18 DIAGNOSIS — E1142 Type 2 diabetes mellitus with diabetic polyneuropathy: Secondary | ICD-10-CM | POA: Diagnosis not present

## 2023-07-18 DIAGNOSIS — I81 Portal vein thrombosis: Secondary | ICD-10-CM | POA: Diagnosis not present

## 2023-07-18 DIAGNOSIS — K851 Biliary acute pancreatitis without necrosis or infection: Secondary | ICD-10-CM

## 2023-07-18 DIAGNOSIS — N1831 Chronic kidney disease, stage 3a: Secondary | ICD-10-CM | POA: Diagnosis not present

## 2023-07-18 LAB — HEPARIN LEVEL (UNFRACTIONATED): Heparin Unfractionated: 0.48 [IU]/mL (ref 0.30–0.70)

## 2023-07-18 LAB — CBC
HCT: 28.3 % — ABNORMAL LOW (ref 36.0–46.0)
Hemoglobin: 9.2 g/dL — ABNORMAL LOW (ref 12.0–15.0)
MCH: 29.6 pg (ref 26.0–34.0)
MCHC: 32.5 g/dL (ref 30.0–36.0)
MCV: 91 fL (ref 80.0–100.0)
Platelets: 192 10*3/uL (ref 150–400)
RBC: 3.11 MIL/uL — ABNORMAL LOW (ref 3.87–5.11)
RDW: 14.6 % (ref 11.5–15.5)
WBC: 6.5 10*3/uL (ref 4.0–10.5)
nRBC: 0 % (ref 0.0–0.2)

## 2023-07-18 LAB — GLUCOSE, CAPILLARY
Glucose-Capillary: 87 mg/dL (ref 70–99)
Glucose-Capillary: 92 mg/dL (ref 70–99)
Glucose-Capillary: 77 mg/dL (ref 70–99)
Glucose-Capillary: 91 mg/dL (ref 70–99)

## 2023-07-18 MED ORDER — SODIUM CHLORIDE 0.9 % IV SOLN
1.5000 g | Freq: Once | INTRAVENOUS | Status: AC
  Administered 2023-07-19: 1.5 g via INTRAVENOUS
  Filled 2023-07-18: qty 4

## 2023-07-18 MED ORDER — HEPARIN (PORCINE) 25000 UT/250ML-% IV SOLN
900.0000 [IU]/h | INTRAVENOUS | Status: AC
  Administered 2023-07-19: 900 [IU]/h via INTRAVENOUS
  Filled 2023-07-18: qty 250

## 2023-07-18 MED ORDER — LORAZEPAM 2 MG/ML IJ SOLN
0.5000 mg | Freq: Once | INTRAMUSCULAR | Status: AC
  Administered 2023-07-18: 0.5 mg via INTRAVENOUS
  Filled 2023-07-18: qty 1

## 2023-07-18 MED ORDER — GADOBUTROL 1 MMOL/ML IV SOLN
6.0000 mL | Freq: Once | INTRAVENOUS | Status: AC | PRN
  Administered 2023-07-18: 6 mL via INTRAVENOUS

## 2023-07-18 NOTE — Progress Notes (Signed)
PHARMACY - ANTICOAGULATION CONSULT NOTE  Pharmacy Consult for heparin Indication:  portal vein thrombosis   No Known Allergies  Patient Measurements:   Heparin Dosing Weight: 52.8 kg   Vital Signs: Temp: 98.3 F (36.8 C) (10/28 0823) Temp Source: Oral (10/28 0823) BP: 115/48 (10/28 0823) Pulse Rate: 74 (10/28 0823)  Labs: Recent Labs    07/16/23 0458 07/17/23 0825 07/18/23 0858 07/18/23 0859  HGB 10.6* 10.5* 9.2*  --   HCT 33.7* 33.9* 28.3*  --   PLT 256 232 192  --   LABPROT  --  13.7  --   --   INR  --  1.0  --   --   HEPARINUNFRC  --  0.47  --  0.48  CREATININE 1.19* 1.09*  --   --     Estimated Creatinine Clearance: 26.6 mL/min (A) (by C-G formula based on SCr of 1.09 mg/dL (H)).   Medical History: Past Medical History:  Diagnosis Date   Anemia    Arthritis    CAD (coronary artery disease)    a. 10/2015 PCI [facility], [place], [place]): OM (2.5x30 Resolute DES), LCX (3.5x12 Resolute DES).   Cervical cancer (HCC) 2005   Chronic systolic CHF (congestive heart failure) (HCC)    a. 01/2016 Echo: EF 20-25%, diff HK, septal-lateral dyssynchrony. Mild MR, mod to sev dil LA, nl RV, mod dil RA, mod TR, PASP .   Colon cancer (HCC) 2001   DM (diabetes mellitus) (HCC)    History of chicken pox    HLD (hyperlipidemia)    Hypertensive heart disease    Ischemic cardiomyopathy    a. 01/2016 Echo: EF 20-25%.   LBBB (left bundle branch block)     Medications:  Scheduled:   insulin aspart  0-6 Units Subcutaneous TID WC   LORazepam  0.5 mg Intravenous Once   pantoprazole (PROTONIX) IV  40 mg Intravenous Q24H   Infusions:   cefTRIAXone (ROCEPHIN)  IV Stopped (07/17/23 1426)   heparin 900 Units/hr (07/17/23 2347)   PRN: acetaminophen **OR** acetaminophen, HYDROmorphone (DILAUDID) injection, ondansetron **OR** ondansetron (ZOFRAN) IV, oxyCODONE  Assessment: 88 yo female presented with abdominal pain found to have portal vein thrombosis. Started on IV heparin drip  at [facility] and continued upon arrival to [facility]. No AC PTA. Planning MRCP this admission.  Heparin level remains therapeutic at 0.48 on heparin 900 units/hour. CBC downtrended slightly (hgb 10.5 >> 9.2; plts 232 >>192).  No s/sx bleeding.  Goal of Therapy:  Heparin level 0.3-0.7 units/ml Monitor platelets by anticoagulation protocol: Yes   Plan:  Continue heparin infusion at 900 units/hr Daily heparin level and CBC F/u ability to transition to PO [facility]  Rexford Maus, PharmD, BCPS 07/18/2023 9:54 AM

## 2023-07-18 NOTE — Consult Note (Signed)
Consultation  Referring Provider: Delfino Lovett, MD Primary Care Physician:  Eustaquio Boyden, MD Primary Gastroenterologist:  unassigned here- DR Lesia Sago GI  Reason for Consultation: Post laparoscopic cholecystectomy 07/15/2023, IOC concerning for choledocholithiasis  HPI: April Short is a 88 y.o. female, who presented to the ER on 07/14/2023 with complaints of abdominal pain nausea and vomiting intermittent over the previous 3 months. She was admitted to [facility].  Abdominal ultrasound showed cholelithiasis but no definite cholecystitis and possible portal vein thrombosis. Lipase 658 CT of the abdomen pelvis showed right anterior branch portal vein thrombosis, and new masslike low-attenuation infiltration around the head of the pancreas extending to the celiac axis around the hepatic artery and most suggestive of acute pancreatitis, possibly groove pancreatitis, also question stone obstructing the distal common bile duct, she also had evidence of bilateral hydronephrosis. He was started on IV heparin for the portal vein thrombosis. Surgery was consulted, and she underwent laparoscopic cholecystectomy with concern for cholelithiasis and possible gallstone pancreatitis on 07/15/2023.  At [facility] felt to have choledocholithiasis.  MRCP was to be done however patient has an ICD in place and apparently they were unable to accomplish MRI at [facility].  She was transferred here. Other medical issues include ischemic cardiomyopathy with EF in 2017 of 20 to 25% apparently more recent imaging shows EF of 50 to 55% in setting of Entresto.  Also with coronary artery disease status post remote stents, chronic kidney disease stage IIIb, left bundle branch block, diabetes mellitus and prior history of colon cancer.  Abs from yesterday show WBC of 7.8/hemoglobin 10.5/hematocrit 33.9 INR 1.0 LFTs within normal limits Lipase of 63  A WBC 6.5/hemoglobin 9.2/hematocrit 28.3 other  labs are still pending  MRI/MRCP has been done and report is pending.  Patient is resting comfortably this afternoon, has been hungry and asking about food earlier, is currently sleeping.  One of her daughters is at bedside and speaks [place] well.  She states her mom has been feeling better since surgery and not having any continued severe pain.  [name] is the healthcare power of attorney and she will be at the hospital later this evening.  Mom has short-term memory issues and consent will need to be signed by Darel Hong.    Past Medical History:  Diagnosis Date   Anemia    Arthritis    CAD (coronary artery disease)    a. 10/2015 PCI [facility] [place], [place]): OM (2.5x30 Resolute DES), LCX (3.5x12 Resolute DES).   Cervical cancer (HCC) 2005   Chronic systolic CHF (congestive heart failure) (HCC)    a. 01/2016 Echo: EF 20-25%, diff HK, septal-lateral dyssynchrony. Mild MR, mod to sev dil LA, nl RV, mod dil RA, mod TR, PASP .   Colon cancer (HCC) 2001   DM (diabetes mellitus) (HCC)    History of chicken pox    HLD (hyperlipidemia)    Hypertensive heart disease    Ischemic cardiomyopathy    a. 01/2016 Echo: EF 20-25%.   LBBB (left bundle branch block)     Past Surgical History:  Procedure Laterality Date   COLON RESECTION  2001   colon cancer   COLONOSCOPY  11/2011   polyp x1, int hem ([place])   CORONARY STENT PLACEMENT     INTRAOPERATIVE CHOLANGIOGRAM N/A 07/15/2023   Procedure: INTRAOPERATIVE CHOLANGIOGRAM;  Surgeon: Sung Amabile, DO;  Location: [facility] ORS;  Service: General;  Laterality: N/A;   TOTAL ABDOMINAL HYSTERECTOMY  2005  cervical cancer    Prior to Admission medications   Medication Sig Start Date End Date Taking? Authorizing Provider  albuterol (PROVENTIL) (2.5 MG/3ML) 0.083% nebulizer solution Take 3 mLs (2.5 mg total) by nebulization 3 (three) times daily as needed for wheezing or shortness of breath. 12/08/21   Eustaquio Boyden, MD  aspirin EC  81 MG tablet Take 81 mg by mouth daily. Swallow whole.    [provider]  atorvastatin (LIPITOR) 40 MG tablet TAKE 1 TABLET BY MOUTH EVERY DAY 10/11/22   Eustaquio Boyden, MD  carvedilol (COREG) 25 MG tablet TAKE 1 TABLET (25 MG TOTAL) BY MOUTH TWICE A DAY WITH MEALS 09/17/22   Eustaquio Boyden, MD  Cholecalciferol (VITAMIN D3) 25 MCG (1000 UT) CAPS Take 1 capsule (1,000 Units total) by mouth daily. 07/02/22   Eustaquio Boyden, MD  ENTRESTO 49-51 MG TAKE 1 TABLET BY MOUTH TWICE A DAY 08/02/22   Runell Gess, MD  FARXIGA 5 MG TABS tablet TAKE 1 TABLET (5 MG TOTAL) BY MOUTH DAILY. 12/28/22   Eustaquio Boyden, MD  metoCLOPramide (REGLAN) 5 MG tablet Take 1 tablet (5 mg total) by mouth 3 (three) times daily as needed for nausea or vomiting (with meals). 07/13/23   Eustaquio Boyden, MD  PROLIA 60 MG/ML SOSY injection Inject 60 mg into the skin every 6 (six) months. 01/31/23   [provider]    Current Facility-Administered Medications  Medication Dose Route Frequency Provider Last Rate Last Admin   acetaminophen (TYLENOL) tablet 650 mg  650 mg Oral Q6H PRN Osvaldo Shipper, MD       Or   acetaminophen (TYLENOL) suppository 650 mg  650 mg Rectal Q6H PRN Osvaldo Shipper, MD       cefTRIAXone (ROCEPHIN) 1 g in sodium chloride 0.9 % 100 mL IVPB  1 g Intravenous Q24H Osvaldo Shipper, MD 200 mL/hr at 07/18/23 1533 1 g at 07/18/23 1533   heparin ADULT infusion 100 units/mL (25000 units/232mL)  900 Units/hr Intravenous Continuous Osvaldo Shipper, MD 9 mL/hr at 07/17/23 2347 900 Units/hr at 07/17/23 2347   HYDROmorphone (DILAUDID) injection 0.5 mg  0.5 mg Intravenous Q4H PRN Osvaldo Shipper, MD   0.5 mg at 07/17/23 1355   insulin aspart (novoLOG) injection 0-6 Units  0-6 Units Subcutaneous TID WC Osvaldo Shipper, MD   1 Units at 07/17/23 1807   ondansetron (ZOFRAN) tablet 4 mg  4 mg Oral Q6H PRN Osvaldo Shipper, MD       Or   ondansetron [facility]) injection 4 mg  4 mg Intravenous Q6H  PRN Osvaldo Shipper, MD       oxyCODONE (Oxy IR/ROXICODONE) immediate release tablet 5 mg  5 mg Oral Q4H PRN Osvaldo Shipper, MD   5 mg at 07/17/23 1551   pantoprazole (PROTONIX) injection 40 mg  40 mg Intravenous Q24H Osvaldo Shipper, MD   40 mg at 07/17/23 1803    Allergies as of 07/16/2023   (No Known Allergies)    Family History  Problem Relation Age of Onset   Colon cancer Mother    Diabetes Sister    Stomach cancer Son    Breast cancer Neg Hx     Social History   Socioeconomic History   Marital status: Widowed    Spouse name: Not on file   Number of children: 7   Years of education: Not on file   Highest education level: Not on file  Occupational History   Not on file  Tobacco Use   Smoking status:  Never   Smokeless tobacco: Never  Substance and Sexual Activity   Alcohol use: Not Currently    Comment: occasionally   Drug use: No   Sexual activity: Not on file  Other Topics Concern   Not on file  Social History Narrative   Widow 2018, lives with daughter   From [place], lived in [place] then [place].    Moved to [place] [facility] [place] [facility] 2021 to be closer to family    Occ: retired   Activity:    Diet:    Social Determinants of Corporate investment banker Strain: Low Risk  (06/20/2023)   Overall Financial Resource Strain (CARDIA)    Difficulty of Paying Living Expenses: Not hard at all  Food Insecurity: No Food Insecurity (07/14/2023)   Hunger Vital Sign    Worried About Running Out of Food in the Last Year: Never true    Ran Out of Food in the Last Year: Never true  Transportation Needs: No Transportation Needs (07/14/2023)   PRAPARE - Administrator, Civil Service (Medical): No    Lack of Transportation (Non-Medical): No  Physical Activity: Inactive (06/20/2023)   Exercise Vital Sign    Days of Exercise per Week: 0 days    Minutes of Exercise per Session: 0 min  Stress: No Stress Concern Present (06/20/2023)   [employer] of Occupational Health - Occupational Stress  Questionnaire    Feeling of Stress : Not at all  Social Connections: Socially Isolated (06/20/2023)   Social Connection and Isolation Panel [NHANES]    Frequency of Communication with Friends and Family: More than three times a week    Frequency of Social Gatherings with Friends and Family: Three times a week    Attends Religious Services: Never    Active Member of Clubs or Organizations: No    Attends Banker Meetings: Never    Marital Status: Widowed  Intimate Partner Violence: Not At Risk (07/14/2023)   Humiliation, Afraid, Rape, and Kick questionnaire    Fear of Current or Ex-Partner: No    Emotionally Abused: No    Physically Abused: No    Sexually Abused: No    Review of Systems: Pertinent positive and negative review of systems were noted in the above HPI section.  All other review of systems was otherwise negative.   Physical Exam: Vital signs in last 24 hours: Temp:  [97.7 F (36.5 C)-100.2 F (37.9 C)] 97.7 F (36.5 C) (10/28 1451) Pulse Rate:  [70-90] 70 (10/28 1451) Resp:  [16-17] 16 (10/28 1451) BP: (112-115)/(41-55) 113/55 (10/28 1451) SpO2:  [90 %-99 %] 99 % (10/28 1451) Last BM Date : 07/08/23 General:   Alert,  Well-developed, well-nourished, very elderly female, somnolent but arousable in NAD Head:  Normocephalic and atraumatic. Eyes:  Sclera clear, no icterus.   Conjunctiva pink. Ears:  Normal auditory acuity. Nose:  No deformity, discharge,  or lesions. Mouth:  No deformity or lesions.   Neck:  Supple; no masses or thyromegaly. Lungs:  Clear throughout to auscultation.   No wheezes, crackles, or rhonchi.  Pacemaker/ICD in left chest wall  CV; Regular rate and rhythm; no murmurs, clicks, rubs,  or gallops. Abdomen:  Soft, tenderness in the right upper quadrant no port dressed, BS active,nonpalp mass or hsm.   Rectal:  Deferred  Msk:  Symmetrical without gross deformities. . Pulses:  Normal pulses noted. Extremities:  Without clubbing or  edema. Neurologic: Somnolent but arousable Skin:  Intact without significant lesions or rashes.Marland Kitchen  Intake/Output from previous day: 10/27 0701 - 10/28 0700 In: 411.8 [I.V.:211.8; IV Piggyback:200] Out: 200 [Urine:100; Drains:100] Intake/Output this shift: Total I/O In: 120 [P.O.:120] Out: -   Lab Results: Recent Labs    07/16/23 0458 07/17/23 0825 07/18/23 0858  WBC 12.7* 7.8 6.5  HGB 10.6* 10.5* 9.2*  HCT 33.7* 33.9* 28.3*  PLT 256 232 192   BMET Recent Labs    07/16/23 0458 07/17/23 0825  NA 136 136  K 5.3* 4.4  CL 105 102  CO2 22 21*  GLUCOSE 93 82  BUN 21 17  CREATININE 1.19* 1.09*  CALCIUM 7.8* 8.1*   LFT Recent Labs    07/17/23 0825  PROT 6.8  ALBUMIN 2.4*  AST 21  ALT 13  ALKPHOS 107  BILITOT 0.9   PT/INR Recent Labs    07/17/23 0825  LABPROT 13.7  INR 1.0   Hepatitis Panel No results for input(s): "HEPBSAG", "HCVAB", "HEPAIGM", "HEPBIGM" in the last 72 hours.    IMPRESSION:   #22 88 year old Hispanic female, non-English-speaking transferred here from [facility] early today to undergo MRI/MRCP.  She underwent laparoscopic cholecystectomy and IOC at [facility] on 07/15/2023. IOC was concerning for multiple filling defects.  She had presented with what sounds like mild gallstone pancreatitis with lipase initially 658.  LFTs however essentially have been normal.  Complicated by new finding of portal vein thrombus for which she has been on heparin-CT was also a bit concerning because of wording of masslike low-attenuation/infiltration around the head of the pancreas was called acute pancreatitis or possibly groove pancreatitis but would query underlying mass lesion [place] with development of a portal vein thrombus.  MRCP was apparently not able to be done at [facility] due to pacemaker/ICD in place.  MRCP has been done this afternoon however report still pending.  LFTs yesterday remained normal. Has been stable  postoperatively  #2 history of coronary artery disease status post remote stents 2013 #3.  History of ischemic cardiomyopathy with previous EF 20 to 25%, she just had very recent cardiac evaluation with most recent EF at 50 to 55% in setting of Entresto use.  #4 diabetes mellitus #5.  Chronic kidney disease stage IIIb #6.  Anemia acute on chronic #7.  History of colon cancer status postresection, and history of cervical cancer status post hysterectomy.  PLAN: Bowel full liquid diet tonight, n.p.o. after midnight Await MRI/MRCP report Patient will be tentatively scheduled for ERCP with Dr. Leone Payor for 1 PM tomorrow. Heparin will need to be held 6 hours prior to procedure. I discussed the procedure in detail with patient's daughter, we also reviewed biliary images, discussed indications risks and benefits.  Per Sister Darel Hong is the healthcare power of attorney and she will need to sign consent.  Will need to discuss further with Darel Hong in the morning if MRCP is positive for choledocholithiasis. GI will follow with you   Salome Hautala PA-C 07/18/2023, 3:42 PM

## 2023-07-18 NOTE — Progress Notes (Addendum)
   07/18/23 1039  Mobility  Activity Ambulated with assistance in room  Level of Assistance Contact guard assist, steadying assist (MinA for Bed Mobility and STS)  Assistive Device Other (Comment) (IV Pole)  Distance Ambulated (ft) 30 ft  Activity Response Tolerated well  Mobility Referral Yes  $Mobility charge 1 Mobility  Mobility Specialist Start Time (ACUTE ONLY) 0930  Mobility Specialist Stop Time (ACUTE ONLY) 0952  Mobility Specialist Time Calculation (min) (ACUTE ONLY) 22 min   Mobility Specialist: Progress Note  Pre-Mobility: HR 74, BP 108/42 (63) Post-Mobility: HR 74, BP 131/53 (76)  Pt agreeable to mobility session - received in bed. Required MinA for Bed Mobility and STS, CG for ambulation using IV Pole. Pt was asymptomatic throughout session with no complaints. Returned to chair with all needs met - call bell within reach. Daughter present.  Barnie Mort, BS Mobility Specialist Please contact via SecureChat or Rehab office at [phone].

## 2023-07-18 NOTE — Progress Notes (Signed)
[facility] PROGRESS NOTE   April Short WGN:[id number] DOB: 08-Jan-1935 DOA: 07/16/2023  PCP: Eustaquio Boyden, MD  Brief History:  88 y.o. female with past medical history of coronary artery disease, chronic systolic CHF status post ICD/pacemaker who presented to the hospital with abdominal pain.  She initially presented to [facility].  She was found to have cholelithiasis with findings concerning for pancreatitis as well as portal vein thrombosis.  She was hospitalized at [facility].  She underwent robotic assisted laparoscopic cholecystectomy on 10/25.  Intraoperative cholangiogram revealed possible choledocholithiasis.  Because of her ICD/pacemaker she cannot undergo MRCP at [facility] which was what was recommended by gastroenterology and general surgery.  Hence she was transferred to [facility] for further management.   Consultants: None yet.  Seen by gastroenterology and general surgery at [facility]  Procedures: Robotic laparoscopic cholecystectomy on 10/25 at [facility]    Subjective/Interval History: Patient's daughter was at the bedside who was able to interpret.  Patient denied any nausea vomiting.  Still experiencing some discomfort in the upper abdomen.  No difficulty urinating this morning.     Assessment/Plan:   Gallstone pancreatitis/cholelithiasis/suspected choledocholithiasis Patient was seen by gastroenterology and general surgery at [facility].  She is status post laparoscopic cholecystectomy.  She has a JP drain. LFTs have been unremarkable for the most part.  Lipase level improved to 63.   MRCP is still pending. Seen by general surgery as well.  Will likely need to involve gastroenterology depending on results of MRCP.   Noted to have low-grade temperature overnight.  Continue to monitor for now.  On ceftriaxone due to abnormal UA.     Portal vein thrombosis Seen on imaging studies.  This was discussed with vascular surgery at [facility] who recommended oral anticoagulation for 6  months.  Currently on IV heparin.  Will transition to oral agents once we are certain that she will not require any further procedures.   Chronic systolic CHF/ICD pacemaker in situ Her EF used to be 20 to 25% based on echo in 2017.  Echocardiogram from December 2023 showed recovery in her systolic function to 50 to 55%.   She was noted to be on carvedilol and Entresto and [place] prior to admission.  These are currently on hold. Volume status is stable.   Chronic kidney disease stage IIIb Renal function stable.    Urinary bladder wall thickening/bilateral hydronephrosis/acute cystitis This was noted on CT scan done on 10/24.  Patient denies any issues with urinating but is tender in suprapubic area.  UA was abnormal from 10/23.  Repeated here and again noted to be abnormal.  Started on ceftriaxone.  Follow-up on urine cultures.  Bladder scan without any retention.  Will need follow-up imaging studies in the outpatient setting to ensure that hydronephrosis has resolved.  If not she will need to be referred to urology.  Coronary artery disease status post PCI Cardiac status is stable.  No chest pain.   Diabetes mellitus type 2 with peripheral neuropathy Monitor CBGs.  SSI.   Normocytic anemia/leukocytosis No evidence for overt bleeding.  Monitor daily for now. WBC is now normal.   DVT Prophylaxis:  on IV heparin Code Status: Full code Family Communication: Discussed with daughter Disposition Plan: Hopefully return home when improved  Status is: Inpatient Remains inpatient appropriate because: Need for MRCP, IV heparin      Medications: Scheduled:  insulin aspart  0-6 Units Subcutaneous TID WC   LORazepam  0.5 mg Intravenous Once   pantoprazole (  PROTONIX) IV  40 mg Intravenous Q24H   Continuous:  cefTRIAXone (ROCEPHIN)  IV Stopped (07/17/23 1426)   heparin 900 Units/hr (07/17/23 2347)   ION:[id number] **OR** acetaminophen, HYDROmorphone (DILAUDID) injection, ondansetron  **OR** ondansetron (ZOFRAN) IV, oxyCODONE  Antibiotics: Anti-infectives (From admission, onward)    Start     Dose/Rate Route Frequency Ordered Stop   07/17/23 1345  cefTRIAXone (ROCEPHIN) 1 g in sodium chloride 0.9 % 100 mL IVPB        1 g 200 mL/hr over 30 Minutes Intravenous Every 24 hours 07/17/23 1251         Objective:  Vital Signs  Vitals:   07/17/23 1529 07/17/23 2016 07/18/23 0514 07/18/23 0823  BP: (!) 150/71 (!) 112/41 (!) 113/51 (!) 115/48  Pulse: 86 90 74 74  Resp: 18 16 16 17   Temp: 98.8 F (37.1 C) 100.2 F (37.9 C) 97.9 F (36.6 C) 98.3 F (36.8 C)  TempSrc: Oral Oral  Oral  SpO2: 90% 90% 93% 91%    Intake/Output Summary (Last 24 hours) at 07/18/2023 0952 Last data filed at 07/18/2023 0824 Gross per 24 hour  Intake 531.75 ml  Output 200 ml  Net 331.75 ml    General appearance: Awake alert.  In no distress Resp: Clear to auscultation bilaterally.  Normal effort Cardio: S1-S2 is normal regular.  No S3-S4.  No rubs murmurs or bruit GI: Abdomen is soft.  Tender diffusely without any rebound rigidity or guarding.  JP drain is noted. No obvious focal neurological deficits.   Lab Results:  Data Reviewed: I have personally reviewed following labs and reports of the imaging studies  CBC: Recent Labs  Lab 07/13/23 1253 07/13/23 1953 07/15/23 0410 07/16/23 0458 07/17/23 0825 07/18/23 0858  WBC 8.1 7.3 7.1 12.7* 7.8 6.5  NEUTROABS 6.4  --   --   --   --   --   HGB 11.4* 11.1* 9.8* 10.6* 10.5* 9.2*  HCT 35.0* 34.9* 30.7* 33.7* 33.9* 28.3*  MCV 89.3 90.6 90.3 92.3 91.6 91.0  PLT 316.0 297 270 256 232 192    Basic Metabolic Panel: Recent Labs  Lab 07/13/23 1253 07/13/23 1953 07/15/23 0410 07/16/23 0458 07/17/23 0825  NA 131* 130* 137 136 136  K 5.5* 4.9 4.1 5.3* 4.4  CL 96 96* 107 105 102  CO2 28 24 24 22  21*  GLUCOSE 303* 248* 79 93 82  BUN 26* 29* 16 21 17   CREATININE 1.21* 1.11* 0.97 1.19* 1.09*  CALCIUM 9.1 8.9 7.8* 7.8* 8.1*     GFR: Estimated Creatinine Clearance: 26.6 mL/min (A) (by C-G formula based on SCr of 1.09 mg/dL (H)).  Liver Function Tests: Recent Labs  Lab 07/13/23 1253 07/13/23 1953 07/15/23 0410 07/16/23 0458 07/17/23 0825  AST 17 19 18 26 21   ALT 18 21 17 16 13   ALKPHOS 159* 146* 114 116 107  BILITOT 0.6 0.8 0.7 0.9 0.9  PROT 7.8 7.8 6.6 6.8 6.8  ALBUMIN 3.4* 3.0* 2.5* 2.6* 2.4*    Recent Labs  Lab 07/13/23 1253 07/13/23 1953 07/17/23 0825  LIPASE 658.0* 384* 63*   Coagulation Profile: Recent Labs  Lab 07/14/23 0300 07/17/23 0825  INR 1.1 1.0    CBG: Recent Labs  Lab 07/17/23 1153 07/17/23 1532 07/17/23 1757 07/17/23 2018 07/18/23 0756  GLUCAP 108* 126* 172* 162* 87    Recent Results (from the past 240 hour(s))  Urine Culture     Status: None   Collection Time: 07/13/23 12:32 PM  Specimen: Urine  Result Value Ref Range Status   MICRO NUMBER: [id number]  Final   SPECIMEN QUALITY: Adequate  Final   Sample Source URINE  Final   STATUS: FINAL  Final   Result:   Final    Mixed genital flora isolated. These superficial bacteria are not indicative of a urinary tract infection. No further organism identification is warranted on this specimen. If clinically indicated, recollect clean-catch, mid-stream urine and transfer  immediately to Urine Culture Transport Tube.       Radiology Studies: No results found.     LOS: 2 days   Kataleya Zaugg Rito Ehrlich  [facility] Pager on [email or web address]  07/18/2023, 9:52 AM

## 2023-07-18 NOTE — Plan of Care (Signed)
  Problem: Health Behavior/Discharge Planning: Goal: Ability to manage health-related needs will improve Outcome: Progressing   Problem: Clinical Measurements: Goal: Diagnostic test results will improve Outcome: Progressing   Problem: Nutrition: Goal: Adequate nutrition will be maintained Outcome: Progressing   Problem: Coping: Goal: Level of anxiety will decrease Outcome: Progressing   

## 2023-07-18 NOTE — Progress Notes (Signed)
This pt has a Scientist, research (life sciences) rep can program pt @1300  nurse made aware pt will need nurse monitoring for scan and pt needs to stay npo.

## 2023-07-18 NOTE — Progress Notes (Signed)
Trauma/Critical Care Follow Up Note  Subjective:    Overnight Issues:   Objective:  Vital signs for last 24 hours: Temp:  [97.9 F (36.6 C)-100.2 F (37.9 C)] 98.3 F (36.8 C) (10/28 0823) Pulse Rate:  [74-90] 74 (10/28 0823) Resp:  [16-18] 17 (10/28 0823) BP: (112-150)/(41-71) 115/48 (10/28 0823) SpO2:  [90 %-93 %] 91 % (10/28 0823)  Hemodynamic parameters for last 24 hours:    Intake/Output from previous day: 10/27 0701 - 10/28 0700 In: 411.8 [I.V.:211.8; IV Piggyback:200] Out: 200 [Urine:100; Drains:100]  Intake/Output this shift: Total I/O In: 120 [P.O.:120] Out: -   Vent settings for last 24 hours:    Physical Exam:  Gen: comfortable, no distress Neuro: follows commands, alert, communicative HEENT: PERRL Neck: supple CV: RRR Pulm: unlabored breathing on RA Abd: soft, NT, incision clean, dry, intact, JP SS GU: urine clear and yellow, +spontaneous voids Extr: wwp, no edema  Results for orders placed or performed during the hospital encounter of 07/16/23 (from the past 24 hour(s))  Glucose, capillary     Status: Abnormal   Collection Time: 07/17/23 11:53 AM  Result Value Ref Range   Glucose-Capillary 108 (H) 70 - 99 mg/dL  Urinalysis, Routine w reflex microscopic -Urine, Clean Catch     Status: Abnormal   Collection Time: 07/17/23 12:10 PM  Result Value Ref Range   Color, Urine YELLOW YELLOW   APPearance HAZY (A) CLEAR   Specific Gravity, Urine 1.015 1.005 - 1.030   pH 5.0 5.0 - 8.0   Glucose, UA 150 (A) NEGATIVE mg/dL   Hgb urine dipstick SMALL (A) NEGATIVE   Bilirubin Urine NEGATIVE NEGATIVE   Ketones, ur 20 (A) NEGATIVE mg/dL   Protein, ur NEGATIVE NEGATIVE mg/dL   Nitrite NEGATIVE NEGATIVE   Leukocytes,Ua SMALL (A) NEGATIVE   RBC / HPF 0-5 0 - 5 RBC/hpf   WBC, UA 21-50 0 - 5 WBC/hpf   Bacteria, UA RARE (A) NONE SEEN   Squamous Epithelial / HPF 0-5 0 - 5 /HPF   Mucus PRESENT   Glucose, capillary     Status: Abnormal   Collection Time:  07/17/23  3:32 PM  Result Value Ref Range   Glucose-Capillary 126 (H) 70 - 99 mg/dL   Comment 1 Notify RN   Glucose, capillary     Status: Abnormal   Collection Time: 07/17/23  5:57 PM  Result Value Ref Range   Glucose-Capillary 172 (H) 70 - 99 mg/dL   Comment 1 Notify RN   Glucose, capillary     Status: Abnormal   Collection Time: 07/17/23  8:18 PM  Result Value Ref Range   Glucose-Capillary 162 (H) 70 - 99 mg/dL  Glucose, capillary     Status: None   Collection Time: 07/18/23  7:56 AM  Result Value Ref Range   Glucose-Capillary 87 70 - 99 mg/dL  CBC     Status: Abnormal   Collection Time: 07/18/23  8:58 AM  Result Value Ref Range   WBC 6.5 4.0 - 10.5 K/uL   RBC 3.11 (L) 3.87 - 5.11 MIL/uL   Hemoglobin 9.2 (L) 12.0 - 15.0 g/dL   HCT 16.1 (L) 09.6 - 04.5 %   MCV 91.0 80.0 - 100.0 fL   MCH 29.6 26.0 - 34.0 pg   MCHC 32.5 30.0 - 36.0 g/dL   RDW 40.9 81.1 - 91.4 %   Platelets 192 150 - 400 K/uL   nRBC 0.0 0.0 - 0.2 %    Assessment & Plan:  Present on Admission:  MCI (mild cognitive impairment) with memory loss  Cholelithiasis  Type 2 diabetes mellitus with peripheral neuropathy (HCC)  Ischemic cardiomyopathy  ICD (implantable cardioverter-defibrillator) in place  CKD stage 3 due to type 2 diabetes mellitus (HCC)  Anemia in chronic kidney disease (CKD)  Choledocholithiasis  Portal vein thrombosis  Acute gallstone pancreatitis    LOS: 2 days   Additional comments:I reviewed the patient's new clinical lab test results.   and I reviewed the patients new imaging test results.    Gallstone pancreatitis/cholelithiasis/suspected choledocholithiasis - status post robotic chole with IOC at [facility], Dr. Tonna Boehringer - lipase was initially elevated at 658 with improvement to 63.  - MRCP has been ordered and is pending. Coordinate with EP due to ICD.   Portal vein thrombosis Seen on imaging studies.  This was discussed with vascular surgery at [facility] who recommended oral anticoagulation  for 6 months.  Currently on IV heparin.   Chronic systolic CHF -systolic function to 50 to 55%.   She was noted to be on carvedilol and Entresto and [place] prior to admission.  These are currently on hold. Volume status is stable.   Chronic kidney disease stage IIIb Renal function stable   Diabetes mellitus type 2 with peripheral neuropathy   Patient seen and examined on [address].  Granddaughter at bedside to interpret.   OK for soft diet from surgery standpoint.  Will not order given pending imaging and procedural plans.   GI and cardiology on board. Will follow peripherally and be available should surgical needs arise.    Diamantina Monks, MD Trauma & General Surgery Please use [email or web address] to contact on call provider  07/18/2023  *Care during the described time interval was provided by me. I have reviewed this patient's available data, including medical history, events of note, physical examination and test results as part of my evaluation.

## 2023-07-19 ENCOUNTER — Inpatient Hospital Stay (HOSPITAL_COMMUNITY): Payer: 59

## 2023-07-19 ENCOUNTER — Encounter (HOSPITAL_COMMUNITY): Admission: AD | Disposition: A | Discharge status: Home / Self Care | Payer: Self-pay | Attending: Internal Medicine

## 2023-07-19 ENCOUNTER — Encounter: Payer: Self-pay | Admitting: Family Medicine

## 2023-07-19 ENCOUNTER — Inpatient Hospital Stay (HOSPITAL_COMMUNITY): Payer: 59 | Admitting: Registered Nurse

## 2023-07-19 DIAGNOSIS — K8050 Calculus of bile duct without cholangitis or cholecystitis without obstruction: Secondary | ICD-10-CM

## 2023-07-19 DIAGNOSIS — I81 Portal vein thrombosis: Secondary | ICD-10-CM | POA: Diagnosis not present

## 2023-07-19 DIAGNOSIS — E1142 Type 2 diabetes mellitus with diabetic polyneuropathy: Secondary | ICD-10-CM | POA: Diagnosis not present

## 2023-07-19 DIAGNOSIS — N1831 Chronic kidney disease, stage 3a: Secondary | ICD-10-CM | POA: Diagnosis not present

## 2023-07-19 HISTORY — PX: ERCP: SHX5425

## 2023-07-19 HISTORY — PX: SPHINCTEROTOMY: SHX5544

## 2023-07-19 HISTORY — PX: REMOVAL OF STONES: SHX5545

## 2023-07-19 LAB — GLUCOSE, CAPILLARY
Glucose-Capillary: 80 mg/dL (ref 70–99)
Glucose-Capillary: 61 mg/dL — ABNORMAL LOW (ref 70–99)
Glucose-Capillary: 146 mg/dL — ABNORMAL HIGH (ref 70–99)
Glucose-Capillary: 91 mg/dL (ref 70–99)
Glucose-Capillary: 249 mg/dL — ABNORMAL HIGH (ref 70–99)

## 2023-07-19 LAB — CBC
HCT: 29.3 % — ABNORMAL LOW (ref 36.0–46.0)
Hemoglobin: 9.2 g/dL — ABNORMAL LOW (ref 12.0–15.0)
MCH: 28.8 pg (ref 26.0–34.0)
MCHC: 31.4 g/dL (ref 30.0–36.0)
MCV: 91.6 fL (ref 80.0–100.0)
Platelets: 202 10*3/uL (ref 150–400)
RBC: 3.2 MIL/uL — ABNORMAL LOW (ref 3.87–5.11)
RDW: 14.6 % (ref 11.5–15.5)
WBC: 6.6 10*3/uL (ref 4.0–10.5)
nRBC: 0 % (ref 0.0–0.2)

## 2023-07-19 LAB — COMPREHENSIVE METABOLIC PANEL
ALT: 8 U/L (ref 0–44)
AST: 15 U/L (ref 15–41)
Albumin: 2.1 g/dL — ABNORMAL LOW (ref 3.5–5.0)
Alkaline Phosphatase: 78 U/L (ref 38–126)
Anion gap: 8 (ref 5–15)
BUN: 11 mg/dL (ref 8–23)
CO2: 21 mmol/L — ABNORMAL LOW (ref 22–32)
Calcium: 7.9 mg/dL — ABNORMAL LOW (ref 8.9–10.3)
Chloride: 105 mmol/L (ref 98–111)
Creatinine, Ser: 0.97 mg/dL (ref 0.44–1.00)
GFR, Estimated: 57 mL/min — ABNORMAL LOW (ref >60)
Glucose, Bld: 76 mg/dL (ref 70–99)
Potassium: 4.2 mmol/L (ref 3.5–5.1)
Sodium: 134 mmol/L — ABNORMAL LOW (ref 135–145)
Total Bilirubin: 0.7 mg/dL (ref 0.3–1.2)
Total Protein: 6 g/dL — ABNORMAL LOW (ref 6.5–8.1)

## 2023-07-19 LAB — MAGNESIUM: Magnesium: 1.7 mg/dL (ref 1.7–2.4)

## 2023-07-19 LAB — URINE CULTURE: Culture: NO GROWTH

## 2023-07-19 LAB — HEPARIN LEVEL (UNFRACTIONATED): Heparin Unfractionated: 0.55 [IU]/mL (ref 0.30–0.70)

## 2023-07-19 LAB — SURGICAL PATHOLOGY

## 2023-07-19 SURGERY — ERCP, WITH INTERVENTION IF INDICATED
Anesthesia: General

## 2023-07-19 MED ORDER — PHENYLEPHRINE HCL-NACL 20-0.9 MG/250ML-% IV SOLN
INTRAVENOUS | Status: DC | PRN
  Administered 2023-07-19: 30 ug/min via INTRAVENOUS

## 2023-07-19 MED ORDER — DEXTROSE 50 % IV SOLN
INTRAVENOUS | Status: AC
Start: 2023-07-19 — End: ?
  Filled 2023-07-19: qty 50

## 2023-07-19 MED ORDER — SODIUM CHLORIDE 0.9 % IV SOLN
INTRAVENOUS | Status: AC | PRN
  Administered 2023-07-19: 500 mL via INTRAMUSCULAR

## 2023-07-19 MED ORDER — SODIUM CHLORIDE 0.9 % IV SOLN
INTRAVENOUS | Status: DC | PRN
  Administered 2023-07-19: 25 mL

## 2023-07-19 MED ORDER — DEXAMETHASONE SODIUM PHOSPHATE 10 MG/ML IJ SOLN
INTRAMUSCULAR | Status: DC | PRN
  Administered 2023-07-19: 10 mg via INTRAVENOUS

## 2023-07-19 MED ORDER — LIDOCAINE 2% (20 MG/ML) 5 ML SYRINGE
INTRAMUSCULAR | Status: DC | PRN
  Administered 2023-07-19: 20 mg via INTRAVENOUS

## 2023-07-19 MED ORDER — CIPROFLOXACIN IN D5W 400 MG/200ML IV SOLN
INTRAVENOUS | Status: AC
  Filled 2023-07-19: qty 200

## 2023-07-19 MED ORDER — DEXTROSE 50 % IV SOLN
25.0000 mL | Freq: Once | INTRAVENOUS | Status: AC
Start: 2023-07-19 — End: 2023-07-19
  Administered 2023-07-19: 25 mL via INTRAVENOUS

## 2023-07-19 MED ORDER — ROCURONIUM BROMIDE 10 MG/ML (PF) SYRINGE
PREFILLED_SYRINGE | INTRAVENOUS | Status: DC | PRN
  Administered 2023-07-19: 30 mg via INTRAVENOUS

## 2023-07-19 MED ORDER — DICLOFENAC SUPPOSITORY 100 MG
RECTAL | Status: DC | PRN
  Administered 2023-07-19: 100 mg via RECTAL

## 2023-07-19 MED ORDER — HEPARIN (PORCINE) 25000 UT/250ML-% IV SOLN
900.0000 [IU]/h | INTRAVENOUS | Status: AC
  Administered 2023-07-19 – 2023-07-20 (×2): 900 [IU]/h via INTRAVENOUS
  Filled 2023-07-19: qty 250

## 2023-07-19 MED ORDER — FENTANYL CITRATE (PF) 100 MCG/2ML IJ SOLN
INTRAMUSCULAR | Status: AC
  Filled 2023-07-19: qty 2

## 2023-07-19 MED ORDER — PROPOFOL 10 MG/ML IV BOLUS
INTRAVENOUS | Status: DC | PRN
  Administered 2023-07-19: 80 mg via INTRAVENOUS

## 2023-07-19 MED ORDER — ONDANSETRON HCL 4 MG/2ML IJ SOLN
INTRAMUSCULAR | Status: DC | PRN
  Administered 2023-07-19: 4 mg via INTRAVENOUS

## 2023-07-19 MED ORDER — DICLOFENAC SUPPOSITORY 100 MG
RECTAL | Status: AC
Start: 2023-07-19 — End: ?
  Filled 2023-07-19: qty 1

## 2023-07-19 MED ORDER — DICLOFENAC SUPPOSITORY 100 MG: 100.0000 mg | Freq: Once | RECTAL | Status: DC

## 2023-07-19 MED ORDER — FENTANYL CITRATE (PF) 250 MCG/5ML IJ SOLN
INTRAMUSCULAR | Status: DC | PRN
  Administered 2023-07-19: 25 ug via INTRAVENOUS

## 2023-07-19 MED ORDER — GLUCAGON HCL RDNA (DIAGNOSTIC) 1 MG IJ SOLR
INTRAMUSCULAR | Status: AC
  Filled 2023-07-19: qty 1

## 2023-07-19 NOTE — Progress Notes (Signed)
Patient ID: April Short, female   DOB: 06-01-1935, 88 y.o.   MRN: [id number]      Progress Note   Subjective   Day # 3 CC; status post laparoscopic cholecystectomy 07/15/2023, IOC concerning for choledocholithiasis-transferred here for MRCP and consideration of ERCP  IV heparin-  MRI/MRCP resulted last p.m. shows an 8 mm stone in the distal common bile duct no significant ductal dilation, there is a thrombus and a tributary of the portal vein no well-defined mass such as cholangiocarcinoma or hepatocellular carcinoma to explain the segmental portal vein thrombosis, there is some hazy infiltrative process adjacent to the pancreatic head and porta hepatis probably due to a combination of lymph nodes and local inflammation possibly from focal pancreatitis-verbal report mentions that there was motion artifact and subtle mass may need to be considered -Status post left hemicolectomy  Labs this a.m.-WBC 6.6/hemoglobin 9.2/hematocrit 29.3 BUN 11/creatinine 0.97 LFTs normal  Spoke with patient's daughter April Short who is the healthcare power of attorney by phone this morning reviewed the indications for ERCP, reviewed the procedure, indications risks and benefits and she is agreeable to signed the permit.    Objective   Vital signs in last 24 hours: Temp:  [97.7 F (36.5 C)-98 F (36.7 C)] 98 F (36.7 C) (10/29 0833) Pulse Rate:  [70-78] 70 (10/29 0833) Resp:  [16] 16 (10/29 0833) BP: (106-122)/(50-57) 115/57 (10/29 0833) SpO2:  [95 %-100 %] 99 % (10/29 0833) Last BM Date : 07/18/23   Intake/Output from previous day: 10/28 0701 - 10/29 0700 In: 170 [P.O.:170] Out: 60 [Drains:60] Intake/Output this shift: Total I/O In: -  Out: 30 [Drains:30]  Lab Results: Recent Labs    07/17/23 0825 07/18/23 0858 07/19/23 0704  WBC 7.8 6.5 6.6  HGB 10.5* 9.2* 9.2*  HCT 33.9* 28.3* 29.3*  PLT 232 192 202   BMET Recent Labs    07/17/23 0825 07/19/23 0704  NA 136 134*  K 4.4 4.2  CL  102 105  CO2 21* 21*  GLUCOSE 82 76  BUN 17 11  CREATININE 1.09* 0.97  CALCIUM 8.1* 7.9*   LFT Recent Labs    07/19/23 0704  PROT 6.0*  ALBUMIN 2.1*  AST 15  ALT 8  ALKPHOS 78  BILITOT 0.7   PT/INR Recent Labs    07/17/23 0825  LABPROT 13.7  INR 1.0    Studies/Results: MR ABDOMEN MRCP W WO CONTAST  Result Date: 07/18/2023 CLINICAL DATA:  Cholelithiasis, possible choledocholithiasis. Right portal vein thrombosis. EXAM: MRI ABDOMEN WITHOUT AND WITH CONTRAST (INCLUDING MRCP) TECHNIQUE: Multiplanar multisequence MR imaging of the abdomen was performed both before and after the administration of intravenous contrast. Heavily T2-weighted images of the biliary and pancreatic ducts were obtained, and three-dimensional MRCP images were rendered by post processing. CONTRAST:  6mL GADAVIST GADOBUTROL 1 MMOL/ML IV SOLN COMPARISON:  07/14/2023 FINDINGS: Despite efforts by the technologist and patient, motion artifact is present on today's exam and could not be eliminated. This reduces exam sensitivity and specificity. This is a common result when MRCP is attempted in the inpatient setting where patients are less likely to be able to breath hold and cooperate in controlling motion. Lower chest: Mild dependent atelectasis in both lower lobes with trace bilateral pleural effusions. Hepatobiliary: Suboptimal depiction of the biliary tree due to prominent motion artifact on the MRCP images. 8 mm filling defect compatible with stone in the distal common bile duct as on image 16 series 9. Mildly truncated distal CBD on coronal images  although less strikingly so on axial images. No intrahepatic or significant extrahepatic biliary dilatation. Thrombus is present in a tributary of the portal vein in segment 7. No hepatic infarct. I do not see well-defined mass such as cholangiocarcinoma or hepatocellular carcinoma to explain the segmental portal vein thrombosis in segment 7, although sometime such masses can  be subtle and the motion artifact on today's exam could obscure such a lesion. No obvious abnormal arterial phase enhancement in this vicinity. The gallbladder is absent. Expected postoperative edema along the gallbladder fossa. Pancreas:  Unremarkable Spleen:  Unremarkable Adrenals/Urinary Tract:  Unremarkable Stomach/Bowel: Absent left colon compatible left hemicolectomy. Anastomotic staple line in the upper pelvis. Vascular/Lymphatic: Atherosclerosis is present, including aortoiliac atherosclerotic disease. Proximal narrowing of the celiac trunk could be from atheromatous vascular disease or median arcuate ligament. However, the celiac trunk does not appear occluded. Other: Hazy infiltrative process adjacent to the pancreatic head and porta hepatis, probably due to a combination of lymph nodes and local inflammation possibly from focal pancreatitis. Similar findings seen on the CT from 07/14/2023 but not from 06/20/2023, favoring an inflammatory process over malignancy. The patient has had an elevated lipase recently. No overt pseudocysts seen. Musculoskeletal: Levoconvex lumbar scoliosis with spondylosis and degenerative disc disease. IMPRESSION: 1. 8 mm stone in the distal common bile duct. No intrahepatic or significant extrahepatic biliary dilatation. 2. Thrombus in a tributary of the portal vein in segment 7. No hepatic infarct. I do not see well-defined mass such as cholangiocarcinoma or hepatocellular carcinoma to explain the segmental portal vein thrombosis in segment 7, although sometimes such masses can be subtle and the motion artifact on today's exam could obscure such a lesion. 3. Hazy infiltrative process adjacent to the pancreatic head and porta hepatis, probably due to a combination of lymph nodes and local inflammation possibly from focal pancreatitis. Similar findings seen on the CT from 07/14/2023 but not from 06/20/2023, this timeframe favors an inflammatory process over malignancy. The  patient has had an elevated lipase recently. No pseudocyst seen. 4. Mild dependent atelectasis in both lower lobes with trace bilateral pleural effusions. 5. Aortic atherosclerosis. 6. Proximal narrowing of the celiac trunk could be from atheromatous vascular disease or median arcuate ligament. However, the celiac trunk does not appear occluded. 7. Levoconvex lumbar scoliosis with spondylosis and degenerative disc disease. 8. Status post left hemicolectomy. Electronically Signed   By: Gaylyn Rong M.D.   On: 07/18/2023 18:48   DG Chest Port 1 View  Result Date: 07/18/2023 CLINICAL DATA:  Defibrillator placement. EXAM: PORTABLE CHEST 1 VIEW COMPARISON:  None Available. FINDINGS: The heart size and mediastinal contours are within normal limits. Left-sided defibrillator is noted with leads in grossly good position. No pneumothorax or effusion is noted. Mild left lingular subsegmental atelectasis or scarring is noted. Minimal right midlung subsegmental atelectasis or scarring is noted. Mild subcutaneous emphysema is seen in the cervical and supraclavicular tissues bilaterally. The visualized skeletal structures are unremarkable. IMPRESSION: Left-sided fibrillator is noted in grossly good position. Mild left lingular subsegmental atelectasis or scarring. Minimal right midlung subsegmental atelectasis or scarring. Mild subcutaneous emphysema is seen in the cervical and supraclavicular soft tissues bilaterally. Electronically Signed   By: Lupita Raider M.D.   On: 07/18/2023 11:45       Assessment / Plan:    #52 88 year old non-English-speaking Hispanic female transferred here from [facility] after undergoing laparoscopic cholecystectomy on 07/15/2023.  IOC was concerning for multiple filling defects in the common bile duct MRCP was  unable to be done at [place] so transferred here  She was also noted at the time of admission to have a new finding of portal vein thrombus and has been started on  IV heparin  MRCP last p.m. did confirm a distal common bile duct stone 8 mm, and also raise concern as above for possible subtle hazy infiltrative process adjacent to the pancreatic head and porta hepatis, possibly combination of lymph nodes and local inflammation from focal pancreatitis but cannot rule out underlying lesion.  With new portal vein thrombosis, underlying malignancy needs to be considered.  LFTs remain normal  Plan; patient is scheduled for ERCP with stone extraction, with Dr. Leone Payor early this afternoon. I discussed with the patient's power of attorney, daughter April Short this a.m. by phone she will sign permit.  ERCP, indications risks and benefits were reviewed in detail and she is agreeable to proceed. Heparin was stopped at 7 AM  Other recommendations pending results of ERCP     Principal Problem:   Choledocholithiasis Active Problems:   Type 2 diabetes mellitus with peripheral neuropathy (HCC)   Ischemic cardiomyopathy   CAD S/P percutaneous coronary angioplasty   ICD (implantable cardioverter-defibrillator) in place   MCI (mild cognitive impairment) with memory loss   CKD stage 3 due to type 2 diabetes mellitus (HCC)   Anemia in chronic kidney disease (CKD)   Cholelithiasis   Portal vein thrombosis   Acute gallstone pancreatitis     LOS: 3 days   Caralynn Gelber  PA-C10/29/2024, 9:11 AM

## 2023-07-19 NOTE — Progress Notes (Addendum)
Hypoglycemic Event  CBG: 61  Treatment: D50 25 mL (12.5 gm)  Symptoms: None  Follow-up CBG: Time: 1240 CBG Result:146  Possible Reasons for Event: Inadequate meal intake and Other: NPO for procedure.  Comments/MD notified:    Philis Pique

## 2023-07-19 NOTE — Progress Notes (Signed)
[facility] PROGRESS NOTE   April Short:[id number] DOB: [birth year] DOA: 07/16/2023  PCP: Eustaquio Boyden, MD  Brief History:  88 y.o. female with past medical history of coronary artery disease, chronic systolic CHF status post ICD/pacemaker who presented to the hospital with abdominal pain.  She initially presented to [facility].  She was found to have cholelithiasis with findings concerning for pancreatitis as well as portal vein thrombosis.  She was hospitalized at [facility].  She underwent robotic assisted laparoscopic cholecystectomy on 10/25.  Intraoperative cholangiogram revealed possible choledocholithiasis.  Because of her ICD/pacemaker she cannot undergo MRCP at [facility] which was what was recommended by gastroenterology and general surgery.  Hence she was transferred to [id number] for further management.   Consultants: None yet.  Seen by gastroenterology and general surgery at [facility]  Procedures: Robotic laparoscopic cholecystectomy on 10/25 at [facility]    Subjective/Interval History: Patient's daughter was at the bedside.  She mentioned that patient was complaining of irritation in her throat.  Abdominal pain is about the same.  No nausea or vomiting.    Assessment/Plan:  Gallstone pancreatitis/cholelithiasis/suspected choledocholithiasis Patient was seen by gastroenterology and general surgery at [facility].  She is status post laparoscopic cholecystectomy.  She has a JP drain. Lipase level improved to 63.   Seen by general surgery as well.  MRCP does show choledocholithiasis.  Other changes also noted.  Gastroenterology consulted.  Plan is for ERCP this afternoon.   Noted to have low-grade fever.  UA was noted to be abnormal.  See below.  On ceftriaxone currently.  LFTs remain unremarkable.   Portal vein thrombosis Seen on imaging studies.  This was discussed with vascular surgery at [facility] who recommended oral anticoagulation for 6 months.  Currently on IV heparin.   Transition to oral anticoagulants when cleared to do so by gastroenterology.   Chronic systolic CHF/ICD pacemaker in situ Her EF used to be 20 to 25% based on echo in 2017.  Echocardiogram from December 2023 showed recovery in her systolic function to 50 to 55%.   She was noted to be on carvedilol and Entresto and [place] prior to admission.  These are currently on hold. Stable from a cardiac standpoint.   Chronic kidney disease stage IIIb Renal function stable.    Urinary bladder wall thickening/bilateral hydronephrosis/acute cystitis This was noted on CT scan done on 10/24.  Patient denies any issues with urinating but is tender in suprapubic area.  UA was abnormal from 10/23.  Repeated here and again noted to be abnormal.  Started on ceftriaxone.  Follow-up on urine cultures.   Bladder scan without any retention.   Will need follow-up imaging studies in the outpatient setting to ensure that hydronephrosis has resolved.  If not she will need to be referred to urology.  Coronary artery disease status post PCI Cardiac status is stable.  No chest pain.   Diabetes mellitus type 2 with peripheral neuropathy Monitor CBGs.  SSI.   Normocytic anemia/leukocytosis No evidence for overt bleeding.  Monitor daily for now. WBC is now normal.   DVT Prophylaxis:  on IV heparin Code Status: Full code Family Communication: Discussed with daughter Disposition Plan: Hopefully return home in 24 to 48 hours.  Status is: Inpatient Remains inpatient appropriate because: Need for MRCP, IV heparin      Medications: Scheduled:  insulin aspart  0-6 Units Subcutaneous TID WC   pantoprazole (PROTONIX) IV  40 mg Intravenous Q24H   Continuous:  ampicillin-sulbactam (UNASYN) IV  cefTRIAXone (ROCEPHIN)  IV 1 g (07/18/23 1533)   YQI:[id number] **OR** acetaminophen, HYDROmorphone (DILAUDID) injection, ondansetron **OR** ondansetron (ZOFRAN) IV, oxyCODONE  Antibiotics: Anti-infectives (From  admission, onward)    Start     Dose/Rate Route Frequency Ordered Stop   07/19/23 1200  ampicillin-sulbactam (UNASYN) 1.5 g in sodium chloride 0.9 % 100 mL IVPB        1.5 g 200 mL/hr over 30 Minutes Intravenous  Once 07/18/23 1644     07/17/23 1345  cefTRIAXone (ROCEPHIN) 1 g in sodium chloride 0.9 % 100 mL IVPB        1 g 200 mL/hr over 30 Minutes Intravenous Every 24 hours 07/17/23 1251         Objective:  Vital Signs  Vitals:   07/18/23 1451 07/18/23 2107 07/19/23 0346 07/19/23 0833  BP: (!) 113/55 (!) 122/54 (!) 106/50 (!) 115/57  Pulse: 70 71 78 70  Resp: 16   16  Temp: 97.7 F (36.5 C)  97.9 F (36.6 C) 98 F (36.7 C)  TempSrc: Axillary  Oral   SpO2: 99% 100% 95% 99%    Intake/Output Summary (Last 24 hours) at 07/19/2023 0839 Last data filed at 07/19/2023 0720 Gross per 24 hour  Intake 50 ml  Output 90 ml  Net -40 ml    General appearance: Awake alert.  In no distress Resp: Clear to auscultation bilaterally.  Normal effort Cardio: S1-S2 is normal regular.  No S3-S4.  No rubs murmurs or bruit GI: Abdomen is soft.  Tender diffusely without any rebound rigidity or guarding.  JP drain is noted. No obvious focal neurological deficits.   Lab Results:  Data Reviewed: I have personally reviewed following labs and reports of the imaging studies  CBC: Recent Labs  Lab 07/13/23 1253 07/13/23 1953 07/15/23 0410 07/16/23 0458 07/17/23 0825 07/18/23 0858 07/19/23 0704  WBC 8.1   < > 7.1 12.7* 7.8 6.5 6.6  NEUTROABS 6.4  --   --   --   --   --   --   HGB 11.4*   < > 9.8* 10.6* 10.5* 9.2* 9.2*  HCT 35.0*   < > 30.7* 33.7* 33.9* 28.3* 29.3*  MCV 89.3   < > 90.3 92.3 91.6 91.0 91.6  PLT 316.0   < > 270 256 232 192 202   < > = values in this interval not displayed.    Basic Metabolic Panel: Recent Labs  Lab 07/13/23 1953 07/15/23 0410 07/16/23 0458 07/17/23 0825 07/19/23 0704  NA 130* 137 136 136 134*  K 4.9 4.1 5.3* 4.4 4.2  CL 96* 107 105 102 105   CO2 24 24 22  21* 21*  GLUCOSE 248* 79 93 82 76  BUN 29* 16 21 17 11   CREATININE 1.11* 0.97 1.19* 1.09* 0.97  CALCIUM 8.9 7.8* 7.8* 8.1* 7.9*  MG  --   --   --   --  1.7    GFR: Estimated Creatinine Clearance: 29.9 mL/min (by C-G formula based on SCr of 0.97 mg/dL).  Liver Function Tests: Recent Labs  Lab 07/13/23 1953 07/15/23 0410 07/16/23 0458 07/17/23 0825 07/19/23 0704  AST 19 18 26 21 15   ALT 21 17 16 13 8   ALKPHOS 146* 114 116 107 78  BILITOT 0.8 0.7 0.9 0.9 0.7  PROT 7.8 6.6 6.8 6.8 6.0*  ALBUMIN 3.0* 2.5* 2.6* 2.4* 2.1*    Recent Labs  Lab 07/13/23 1253 07/13/23 1953 07/17/23 0825  LIPASE 658.0* 384* 63*  Coagulation Profile: Recent Labs  Lab 07/14/23 0300 07/17/23 0825  INR 1.1 1.0    CBG: Recent Labs  Lab 07/17/23 2018 07/18/23 0756 07/18/23 1136 07/18/23 1653 07/18/23 2108  GLUCAP 162* 87 92 77 91    Recent Results (from the past 240 hour(s))  Urine Culture     Status: None   Collection Time: 07/13/23 12:32 PM   Specimen: Urine  Result Value Ref Range Status   MICRO NUMBER: [id number]  Final   SPECIMEN QUALITY: Adequate  Final   Sample Source URINE  Final   STATUS: FINAL  Final   Result:   Final    Mixed genital flora isolated. These superficial bacteria are not indicative of a urinary tract infection. No further organism identification is warranted on this specimen. If clinically indicated, recollect clean-catch, mid-stream urine and transfer  immediately to Urine Culture Transport Tube.       Radiology Studies: MR ABDOMEN MRCP W WO CONTAST  Result Date: 07/18/2023 CLINICAL DATA:  Cholelithiasis, possible choledocholithiasis. Right portal vein thrombosis. EXAM: MRI ABDOMEN WITHOUT AND WITH CONTRAST (INCLUDING MRCP) TECHNIQUE: Multiplanar multisequence MR imaging of the abdomen was performed both before and after the administration of intravenous contrast. Heavily T2-weighted images of the biliary and pancreatic ducts were obtained,  and three-dimensional MRCP images were rendered by post processing. CONTRAST:  6mL GADAVIST GADOBUTROL 1 MMOL/ML IV SOLN COMPARISON:  07/14/2023 FINDINGS: Despite efforts by the technologist and patient, motion artifact is present on today's exam and could not be eliminated. This reduces exam sensitivity and specificity. This is a common result when MRCP is attempted in the inpatient setting where patients are less likely to be able to breath hold and cooperate in controlling motion. Lower chest: Mild dependent atelectasis in both lower lobes with trace bilateral pleural effusions. Hepatobiliary: Suboptimal depiction of the biliary tree due to prominent motion artifact on the MRCP images. 8 mm filling defect compatible with stone in the distal common bile duct as on image 16 series 9. Mildly truncated distal CBD on coronal images although less strikingly so on axial images. No intrahepatic or significant extrahepatic biliary dilatation. Thrombus is present in a tributary of the portal vein in segment 7. No hepatic infarct. I do not see well-defined mass such as cholangiocarcinoma or hepatocellular carcinoma to explain the segmental portal vein thrombosis in segment 7, although sometime such masses can be subtle and the motion artifact on today's exam could obscure such a lesion. No obvious abnormal arterial phase enhancement in this vicinity. The gallbladder is absent. Expected postoperative edema along the gallbladder fossa. Pancreas:  Unremarkable Spleen:  Unremarkable Adrenals/Urinary Tract:  Unremarkable Stomach/Bowel: Absent left colon compatible left hemicolectomy. Anastomotic staple line in the upper pelvis. Vascular/Lymphatic: Atherosclerosis is present, including aortoiliac atherosclerotic disease. Proximal narrowing of the celiac trunk could be from atheromatous vascular disease or median arcuate ligament. However, the celiac trunk does not appear occluded. Other: Hazy infiltrative process adjacent to the  pancreatic head and porta hepatis, probably due to a combination of lymph nodes and local inflammation possibly from focal pancreatitis. Similar findings seen on the CT from 07/14/2023 but not from 06/20/2023, favoring an inflammatory process over malignancy. The patient has had an elevated lipase recently. No overt pseudocysts seen. Musculoskeletal: Levoconvex lumbar scoliosis with spondylosis and degenerative disc disease. IMPRESSION: 1. 8 mm stone in the distal common bile duct. No intrahepatic or significant extrahepatic biliary dilatation. 2. Thrombus in a tributary of the portal vein in segment 7. No hepatic infarct. I  do not see well-defined mass such as cholangiocarcinoma or hepatocellular carcinoma to explain the segmental portal vein thrombosis in segment 7, although sometimes such masses can be subtle and the motion artifact on today's exam could obscure such a lesion. 3. Hazy infiltrative process adjacent to the pancreatic head and porta hepatis, probably due to a combination of lymph nodes and local inflammation possibly from focal pancreatitis. Similar findings seen on the CT from 07/14/2023 but not from 06/20/2023, this timeframe favors an inflammatory process over malignancy. The patient has had an elevated lipase recently. No pseudocyst seen. 4. Mild dependent atelectasis in both lower lobes with trace bilateral pleural effusions. 5. Aortic atherosclerosis. 6. Proximal narrowing of the celiac trunk could be from atheromatous vascular disease or median arcuate ligament. However, the celiac trunk does not appear occluded. 7. Levoconvex lumbar scoliosis with spondylosis and degenerative disc disease. 8. Status post left hemicolectomy. Electronically Signed   By: Gaylyn Rong M.D.   On: 07/18/2023 18:48   DG Chest Port 1 View  Result Date: 07/18/2023 CLINICAL DATA:  Defibrillator placement. EXAM: PORTABLE CHEST 1 VIEW COMPARISON:  None Available. FINDINGS: The heart size and mediastinal  contours are within normal limits. Left-sided defibrillator is noted with leads in grossly good position. No pneumothorax or effusion is noted. Mild left lingular subsegmental atelectasis or scarring is noted. Minimal right midlung subsegmental atelectasis or scarring is noted. Mild subcutaneous emphysema is seen in the cervical and supraclavicular tissues bilaterally. The visualized skeletal structures are unremarkable. IMPRESSION: Left-sided fibrillator is noted in grossly good position. Mild left lingular subsegmental atelectasis or scarring. Minimal right midlung subsegmental atelectasis or scarring. Mild subcutaneous emphysema is seen in the cervical and supraclavicular soft tissues bilaterally. Electronically Signed   By: Lupita Raider M.D.   On: 07/18/2023 11:45       LOS: 3 days   Ebbie Cherry Rito Ehrlich  [facility] Pager on [email or web address]  07/19/2023, 8:39 AM

## 2023-07-19 NOTE — Anesthesia Procedure Notes (Signed)
Procedure Name: Intubation Date/Time: 07/19/2023 1:20 PM  Performed by: Loleta Emy Angevine, CRNAPre-anesthesia Checklist: Patient identified, Patient being monitored, Timeout performed, Emergency Drugs available and Suction available Patient Re-evaluated:Patient Re-evaluated prior to induction Oxygen Delivery Method: Circle system utilized Preoxygenation: Pre-oxygenation with 100% oxygen Induction Type: IV induction Ventilation: Mask ventilation without difficulty Laryngoscope Size: Mac and 3 Grade View: Grade I Tube type: Oral Tube size: 6.5 mm Number of attempts: 1 Airway Equipment and Method: Stylet Placement Confirmation: ETT inserted through vocal cords under direct vision, positive ETCO2 and breath sounds checked- equal and bilateral Secured at: 21 cm Tube secured with: Tape Dental Injury: Teeth and Oropharynx as per pre-operative assessment

## 2023-07-19 NOTE — Plan of Care (Signed)
  Problem: Clinical Measurements: Goal: Ability to maintain clinical measurements within normal limits will improve Outcome: Progressing   Problem: Activity: Goal: Risk for activity intolerance will decrease Outcome: Progressing   Problem: Coping: Goal: Level of anxiety will decrease Outcome: Progressing   Problem: Pain Management: Goal: General experience of comfort will improve Outcome: Progressing   Problem: Skin Integrity: Goal: Risk for impaired skin integrity will decrease Outcome: Progressing

## 2023-07-19 NOTE — Anesthesia Preprocedure Evaluation (Addendum)
Anesthesia Evaluation  Patient identified by MRN, date of birth, ID band Patient awake    Reviewed: Allergy & Precautions, NPO status , Patient's Chart, lab work & pertinent test results  Airway Mallampati: II  TM Distance: >3 FB Neck ROM: Full    Dental  (+) Dental Advisory Given   Pulmonary neg pulmonary ROS   breath sounds clear to auscultation       Cardiovascular hypertension, Pt. on home beta blockers + CAD, + Cardiac Stents and +CHF  + dysrhythmias (LBBB)  Rhythm:Regular Rate:Normal     Neuro/Psych  Neuromuscular disease    GI/Hepatic negative GI ROS,,,(+) Hepatitis -  Endo/Other  diabetes, Type 2    Renal/GU CRFRenal disease     Musculoskeletal  (+) Arthritis ,    Abdominal   Peds  Hematology  (+) Blood dyscrasia, anemia   Anesthesia Other Findings   Reproductive/Obstetrics                             Anesthesia Physical Anesthesia Plan  ASA: 3  Anesthesia Plan: General   Post-op Pain Management:    Induction: Intravenous  PONV Risk Score and Plan: 3 and Dexamethasone, Ondansetron and Treatment may vary due to age or medical condition  Airway Management Planned: Oral ETT  Additional Equipment:   Intra-op Plan:   Post-operative Plan: Extubation in OR  Informed Consent: I have reviewed the patients History and Physical, chart, labs and discussed the procedure including the risks, benefits and alternatives for the proposed anesthesia with the patient or authorized representative who has indicated his/her understanding and acceptance.     Dental advisory given  Plan Discussed with: CRNA  Anesthesia Plan Comments:        Anesthesia Quick Evaluation

## 2023-07-19 NOTE — Care Management Important Message (Signed)
Important Message  Patient Details  Name: April Short MRN: [id number] Date of Birth: 08/25/1935   Important Message Given:  Yes - Medicare IM     Sherilyn Banker 07/19/2023, 12:09 PM

## 2023-07-19 NOTE — Op Note (Addendum)
[facility] Patient Name: April Short Procedure Date : 07/19/2023 MRN: [id number] Attending MD: Iva Boop , MD, [id number] Date of Birth: 1934/10/16 CSN: [id number] Age: 88 Admit Type: Inpatient Procedure:                ERCP Indications:              Bile duct stone(s) Providers:                Iva Boop, MD, Fransisca Connors, Melany Guernsey, Technician Referring MD:             Ellamae Sia [name], MD Medicines:                General Anesthesia, diclofenac 100 mg pr                           on ceftriaxone continuous and intermittent Complications:            No immediate complications. Estimated Blood Loss:     Estimated blood loss: none. Procedure:                Pre-Anesthesia Assessment:                           - Prior to the procedure, a History and Physical                            was performed, and patient medications and                            allergies were reviewed. The patient's tolerance of                            previous anesthesia was also reviewed. The risks                            and benefits of the procedure and the sedation                            options and risks were discussed with the patient.                            All questions were answered, and informed consent                            was obtained. Prior Anticoagulants: The patient                            last took heparin on the day of the procedure. ASA                            Grade Assessment: III - A patient with severe  systemic disease. After reviewing the risks and                            benefits, the patient was deemed in satisfactory                            condition to undergo the procedure.                           After obtaining informed consent, the scope was                            passed under direct vision. Throughout the                            procedure, the  patient's blood pressure, pulse, and                            oxygen saturations were monitored continuously. The                            [employer] D single use                            duodenoscope was introduced through the mouth, and                            used to inject contrast into and used to cannulate                            the bile duct. The ERCP was accomplished without                            difficulty. The patient tolerated the procedure                            well. Scope In: Scope Out: Findings:      Scout film showed GB fossa drain and clips s/p lap chole. Esophagus not       seen well, stomach grossly normal. The duodenum had a large diverticulum       with a prominent but normal papilla on the edge. The papilla was       cannulated deeply using a wire and contrast injected showing 9-10 mm       maximum diameter CBD and an 8 mm stone, otherwise normal s/p chole. A       mod-large biliary sphincterotomy was performed over the wire and stone       fragments were removed with multiple balloon sweeps and negative       occlusion cholangiogram at end. There were some air bubbles seen at       times. No pancreatogram. Impression:               - Choledocholithiasis was found. Complete removal  was accomplished by biliary sphincterotomy and                            balloon extraction.                           - Duodenal diverticulum - periampullary Recommendation:           - Return to floor                           May stop ceftriaxone unless needed otherwise                           Resume heparin w/o bolus at 1700                           May use DOAC in 2 days if ok                           Primary GI ([place]) or other MD may consider                            additional imaging in future given these MRCP                            finidngs:                           2. Thrombus in a tributary  of the portal vein in                            segment 7. No                           hepatic infarct. I do not see well-defined mass                            such as                           cholangiocarcinoma or hepatocellular carcinoma to                            explain the                           segmental portal vein thrombosis in segment 7,                            although sometimes                           such masses can be subtle and the motion artifact                            on today's exam  could obscure such a lesion.                           3. Hazy infiltrative process adjacent to the                            pancreatic head and                           porta hepatis, probably due to a combination of                            lymph nodes and                           local inflammation possibly from focal                            pancreatitis. Similar                           findings seen on the CT from 07/14/2023 but not                            from 06/20/2023,                           this timeframe favors an inflammatory process over                            malignancy. The                           patient has had an elevated lipase recently. No                            pseudocyst seen. Procedure Code(s):        --- Professional ---                           [phone], Endoscopic retrograde                            cholangiopancreatography (ERCP); with removal of                            calculi/debris from biliary/pancreatic duct(s)                           43262, Endoscopic retrograde                            cholangiopancreatography (ERCP); with                            sphincterotomy/papillotomy Diagnosis Code(s):        --- Professional ---  K80.50, Calculus of bile duct without cholangitis                            or cholecystitis without obstruction CPT copyright 2022  American Medical Association. All rights reserved. The codes documented in this report are preliminary and upon coder review may  be revised to meet current compliance requirements. Iva Boop, MD 07/19/2023 2:26:48 PM This report has been signed electronically. Number of Addenda: 0

## 2023-07-19 NOTE — Transfer of Care (Signed)
Immediate Anesthesia Transfer of Care Note  Patient: April Short  Procedure(s) Performed: ENDOSCOPIC RETROGRADE CHOLANGIOPANCREATOGRAPHY (ERCP) SPHINCTEROTOMY REMOVAL OF STONES  Patient Location: Endoscopy Unit  Anesthesia Type:General  Level of Consciousness: awake  Airway & Oxygen Therapy: Patient Spontanous Breathing  Post-op Assessment: Report given to RN and Post -op Vital signs reviewed and stable  Post vital signs: Reviewed and stable  Last Vitals:  Vitals Value Taken Time  BP 103/40 07/19/23 1418  Temp    Pulse 70 07/19/23 1418  Resp 20 07/19/23 1418  SpO2 95 % 07/19/23 1418    Last Pain:  Vitals:   07/19/23 1418  TempSrc:   PainSc: 0-No pain         Complications: No notable events documented.

## 2023-07-19 NOTE — Progress Notes (Signed)
   07/19/23 0900  Mobility  Activity Ambulated with assistance in room  Level of Assistance Contact guard assist, steadying assist  Assistive Device Other (Comment) (HHA)  Distance Ambulated (ft) 20 ft  Activity Response Tolerated well  Mobility Referral Yes  $Mobility charge 1 Mobility  Mobility Specialist Start Time (ACUTE ONLY) 0900  Mobility Specialist Stop Time (ACUTE ONLY) [id number]  Mobility Specialist Time Calculation (min) (ACUTE ONLY) 22 min   Mobility Specialist: Progress Note Pre-Mobility: HR 69,SpO2 98% 2L Post-Mobility: HR 79, SpO2 98% 2L  Pt agreeable to mobility session - received in bed. Required CG using HHA. Pt was asymptomatic throughout session with no complaints.Returned to chair with all needs met - call bell within reach. Daughter and RN present.   Barnie Mort, BS Mobility Specialist Please contact via SecureChat or Rehab office at [phone].

## 2023-07-19 NOTE — Progress Notes (Signed)
PHARMACY - ANTICOAGULATION CONSULT NOTE  Pharmacy Consult for heparin Indication:  portal vein thrombosis   No Known Allergies  Patient Measurements: Weight: 52.8 kg (116 lb 6.5 oz) Heparin Dosing Weight: 52.8 kg   Vital Signs: Temp: 98.5 F (36.9 C) (10/29 1216) Temp Source: Temporal (10/29 1216) BP: 96/44 (10/29 1440) Pulse Rate: 74 (10/29 1440)  Labs: Recent Labs    07/17/23 0825 07/18/23 0858 07/18/23 0859 07/19/23 0704  HGB 10.5* 9.2*  --  9.2*  HCT 33.9* 28.3*  --  29.3*  PLT 232 192  --  202  LABPROT 13.7  --   --   --   INR 1.0  --   --   --   HEPARINUNFRC 0.47  --  0.48 0.55  CREATININE 1.09*  --   --  0.97    Estimated Creatinine Clearance: 29.9 mL/min (by C-G formula based on SCr of 0.97 mg/dL).   Medical History: Past Medical History:  Diagnosis Date   Anemia    Arthritis    CAD (coronary artery disease)    a. 10/2015 PCI [facility] [place], [place]): OM (2.5x30 Resolute DES), LCX (3.5x12 Resolute DES).   Cervical cancer (HCC) 2005   Chronic systolic CHF (congestive heart failure) (HCC)    a. 01/2016 Echo: EF 20-25%, diff HK, septal-lateral dyssynchrony. Mild MR, mod to sev dil LA, nl RV, mod dil RA, mod TR, PASP .   Colon cancer (HCC) 2001   DM (diabetes mellitus) (HCC)    History of chicken pox    HLD (hyperlipidemia)    Hypertensive heart disease    Ischemic cardiomyopathy    a. 01/2016 Echo: EF 20-25%.   LBBB (left bundle branch block)     Medications:  Scheduled:   insulin aspart  0-6 Units Subcutaneous TID WC   pantoprazole (PROTONIX) IV  40 mg Intravenous Q24H   Infusions:   cefTRIAXone (ROCEPHIN)  IV 1 g (07/18/23 1533)   PRN: acetaminophen **OR** acetaminophen, HYDROmorphone (DILAUDID) injection, ondansetron **OR** ondansetron (ZOFRAN) IV, oxyCODONE  Assessment: 88 yo female presented with abdominal pain found to have portal vein thrombosis. Started on IV heparin drip at [facility] and continued upon arrival to [facility]. No AC PTA.  Planning MRCP this admission.  10/29 Update: heparin drip off at 07:00 this morning for ERCP - Choledocholithiasis was found, complete removal by biliary sphincterotomy and  balloon extraction. Per GI, heparin may resume at 17:00 without a bolus.  Goal of Therapy:  Heparin level 0.3-0.7 units/ml Monitor platelets by anticoagulation protocol: Yes   Plan:  At 17:00, Resume heparin infusion at 900 units/hr based on previous level Check heparin level in 8hrs at 01:00 tomorrow Daily heparin level and CBC F/u ability to transition to PO [facility] (likely in 2 days per GI op note)  Loralee Pacas, PharmD, BCPS 07/19/2023 3:06 PM  Please check AMION for all [facility] Pharmacy phone numbers After 10:00 PM, call Main Pharmacy [phone]

## 2023-07-20 DIAGNOSIS — Z9889 Other specified postprocedural states: Secondary | ICD-10-CM | POA: Diagnosis not present

## 2023-07-20 DIAGNOSIS — I81 Portal vein thrombosis: Secondary | ICD-10-CM | POA: Diagnosis not present

## 2023-07-20 DIAGNOSIS — K8050 Calculus of bile duct without cholangitis or cholecystitis without obstruction: Secondary | ICD-10-CM | POA: Diagnosis not present

## 2023-07-20 LAB — COMPREHENSIVE METABOLIC PANEL
ALT: 51 U/L — ABNORMAL HIGH (ref 0–44)
AST: 87 U/L — ABNORMAL HIGH (ref 15–41)
Albumin: 2.2 g/dL — ABNORMAL LOW (ref 3.5–5.0)
Alkaline Phosphatase: 335 U/L — ABNORMAL HIGH (ref 38–126)
Anion gap: 13 (ref 5–15)
BUN: 17 mg/dL (ref 8–23)
CO2: 20 mmol/L — ABNORMAL LOW (ref 22–32)
Calcium: 8 mg/dL — ABNORMAL LOW (ref 8.9–10.3)
Chloride: 104 mmol/L (ref 98–111)
Creatinine, Ser: 1.4 mg/dL — ABNORMAL HIGH (ref 0.44–1.00)
GFR, Estimated: 37 mL/min — ABNORMAL LOW (ref >60)
Glucose, Bld: 114 mg/dL — ABNORMAL HIGH (ref 70–99)
Potassium: 5 mmol/L (ref 3.5–5.1)
Sodium: 137 mmol/L (ref 135–145)
Total Bilirubin: 0.8 mg/dL (ref 0.3–1.2)
Total Protein: 6.1 g/dL — ABNORMAL LOW (ref 6.5–8.1)

## 2023-07-20 LAB — GLUCOSE, CAPILLARY
Glucose-Capillary: 102 mg/dL — ABNORMAL HIGH (ref 70–99)
Glucose-Capillary: 92 mg/dL (ref 70–99)
Glucose-Capillary: 118 mg/dL — ABNORMAL HIGH (ref 70–99)
Glucose-Capillary: 156 mg/dL — ABNORMAL HIGH (ref 70–99)

## 2023-07-20 LAB — CBC
HCT: 30.6 % — ABNORMAL LOW (ref 36.0–46.0)
Hemoglobin: 9.7 g/dL — ABNORMAL LOW (ref 12.0–15.0)
MCH: 28.7 pg (ref 26.0–34.0)
MCHC: 31.7 g/dL (ref 30.0–36.0)
MCV: 90.5 fL (ref 80.0–100.0)
Platelets: 215 10*3/uL (ref 150–400)
RBC: 3.38 MIL/uL — ABNORMAL LOW (ref 3.87–5.11)
RDW: 14.4 % (ref 11.5–15.5)
WBC: 5.9 10*3/uL (ref 4.0–10.5)
nRBC: 0 % (ref 0.0–0.2)

## 2023-07-20 LAB — HEPARIN LEVEL (UNFRACTIONATED): Heparin Unfractionated: 0.46 [IU]/mL (ref 0.30–0.70)

## 2023-07-20 MED ORDER — SODIUM CHLORIDE 0.9 % IV SOLN: INTRAVENOUS | Status: DC

## 2023-07-20 MED ORDER — SODIUM CHLORIDE 0.9 % IV SOLN: INTRAVENOUS | Status: AC

## 2023-07-20 NOTE — Evaluation (Signed)
Physical Therapy Evaluation Patient Details Name: April Short MRN: [id number] DOB: August 06, 1935 Today's Date: 07/20/2023  History of Present Illness  88 year old female with history of coronary disease, chronic systolic CHF, status post ICD/pacemaker who presented with abdominal pain initially [facility] hospital.  She was found to have cholelithiasis, possible pancreatitis as well as portal vein thrombosis.  She underwent robotic assisted laparoscopic cholecystectomy on 10/25.  Intraoperatively, she was found to have possible choledocholithiasis.  Because of her ICD/pacemaker status, could not undergo MRCP at [facility], she was transferred to [facility].  General surgery consulted here.  Underwent ERCP on 10/29.  PMH  MCI, CKD stage 3, DMII, ischemic cardiomyopathy,  Clinical Impression  Patient evaluated by Physical Therapy with no further acute PT needs identified. All education has been completed and the patient has no further questions. Pt lives with daughter who is present 24/7 and assists with iADLs. Pt is close to her baseline level of function. Pt has no follow-up Physical Therapy or equipment needs. PT is signing off but has referred pt to Mobility Team.        If plan is discharge home, recommend the following: A little help with bathing/dressing/bathroom;Assist for transportation;Help with stairs or ramp for entrance   Can travel by private vehicle    Yes    Equipment Recommendations None recommended by PT  Recommendations for Other Services       Functional Status Assessment Patient has not had a recent decline in their functional status     Precautions / Restrictions Precautions Precautions: Fall Restrictions Weight Bearing Restrictions: No      Mobility  Bed Mobility Overal bed mobility: Needs Assistance Bed Mobility: Supine to Sit     Supine to sit: Min assist, HOB elevated     General bed mobility comments: min A for pt to reach out to PT to pull to EoB     Transfers Overall transfer level: Needs assistance Equipment used: None Transfers: Sit to/from Stand, Bed to chair/wheelchair/BSC Sit to Stand: Supervision           General transfer comment: good power up, slight use of bed on back of legs to steady    Ambulation/Gait Ambulation/Gait assistance: Contact guard assist, Supervision Gait Distance (Feet): 120 Feet (70 + 60) Assistive device: None Gait Pattern/deviations: Step-through pattern, Decreased step length - right, Decreased step length - left, Trunk flexed Gait velocity: slowed Gait velocity interpretation: <1.8 ft/sec, indicate of risk for recurrent falls   General Gait Details: CGA for safety initially, progresses to supervision with distance , vc for upright posture and looking forwards, mild instability with fatigue, PT encouraged seated rest break and afterward is ambulation in much steadier with return to room        Balance Overall balance assessment: Needs assistance Sitting-balance support: No upper extremity supported Sitting balance-Leahy Scale: Good     Standing balance support: No upper extremity supported, During functional activity Standing balance-Leahy Scale: Good                               Pertinent Vitals/Pain Pain Assessment Pain Assessment: No/denies pain    Home Living Family/patient expects to be discharged to:: Private residence Living Arrangements: Children Available Help at Discharge: Family Type of Home: House Home Access: Stairs to enter Entrance Stairs-Rails: None Entrance Stairs-Number of Steps: 2   Home Layout: One level Home Equipment: Shower seat;Hand held shower head;Grab bars - tub/shower;Rolling Environmental consultant (2  wheels);Wheelchair - manual;Cane - single point;Rollator (4 wheels);Transport chair Additional Comments: Pt lives with daughter, has many other family members who can assist daily as needed.    Prior Function Prior Level of Function : Needs assist              Mobility Comments: uses transport chair in community, and no AD in home ADLs Comments: some set up for bathing but largely independent, dresses and cooks     Extremity/Trunk Assessment   Upper Extremity Assessment Upper Extremity Assessment: Defer to OT evaluation    Lower Extremity Assessment Lower Extremity Assessment: Generalized weakness    Cervical / Trunk Assessment Cervical / Trunk Assessment: Normal  Communication   Communication Communication: Other (comment) (utilized in house intepreter Raquel)  Cognition Arousal: Alert Behavior During Therapy: WFL for tasks assessed/performed Overall Cognitive Status: Within Functional Limits for tasks assessed                                          General Comments General comments (skin integrity, edema, etc.): VSS on RA        Assessment/Plan    PT Assessment Patient does not need any further PT services         PT Goals (Current goals can be found in the Care Plan section)  Acute Rehab PT Goals PT Goal Formulation: With patient/family     AM-PAC PT "6 Clicks" Mobility  Outcome Measure Help needed turning from your back to your side while in a flat bed without using bedrails?: None Help needed moving from lying on your back to sitting on the side of a flat bed without using bedrails?: A Little Help needed moving to and from a bed to a chair (including a wheelchair)?: None Help needed standing up from a chair using your arms (e.g., wheelchair or bedside chair)?: None Help needed to walk in hospital room?: None Help needed climbing 3-5 steps with a railing? : A Little 6 Click Score: 22    End of Session Equipment Utilized During Treatment: Gait belt Activity Tolerance: Patient tolerated treatment well Patient left: in chair;with call bell/phone within reach;with family/visitor present Nurse Communication: Mobility status PT Visit Diagnosis: Muscle weakness (generalized) (M62.81)     Time: 1400-1445 PT Time Calculation (min) (ACUTE ONLY): 45 min   Charges:   PT Evaluation $PT Eval Low Complexity: 1 Low PT Treatments $Gait Training: 23-37 mins PT General Charges $$ ACUTE PT VISIT: 1 Visit         [name] Hovis B. Beverely Risen PT, DPT Acute Rehabilitation Services Please use secure chat or  Call Office [phone]   Elon Alas Fleet 07/20/2023, 3:52 PM

## 2023-07-20 NOTE — Plan of Care (Signed)

## 2023-07-20 NOTE — Progress Notes (Signed)
Progress Note   Assessment    88 year old with recent laparoscopic cholecystectomy, 07/15/2023, transferred with CBD stones status post ERCP on 07/19/2023 with successful stone extraction, segmental portal vein thrombosis on IV heparin  Principal Problem:   Choledocholithiasis Active Problems:   Type 2 diabetes mellitus with peripheral neuropathy (HCC)   Ischemic cardiomyopathy   CAD S/P percutaneous coronary angioplasty   ICD (implantable cardioverter-defibrillator) in place   MCI (mild cognitive impairment) with memory loss   CKD stage 3 due to type 2 diabetes mellitus (HCC)   Anemia in chronic kidney disease (CKD)   Cholelithiasis   Portal vein thrombosis   Acute gallstone pancreatitis   Recommendations   1.  CBD stones --successful ERCP yesterday.  No apparent complication. -- Diet advanced  2.  Laparoscopic cholecystectomy, POD #5/JP drain in place --continue postsurgical care.  JP drain removal will be deferred to her general surgeon  3.  Segmental portal vein thrombosis --no exact clear etiology, no definitive hepatobiliary malignancy identified.  Currently on heparin drip with plans for DOAC tomorrow -- DOAC initiation tomorrow  4.  CHF --has not been resumed on outpatient Entresto, carvedilol; defer restart to hospitalist team  Will arrange follow-up with [facility], Dr. Marina Goodell patient, after discharge for continuity and per family request  GI will sign off, call with questions   Chief Complaint   No upper abdominal pain, some slight fullness in her abdomen Tolerating diet No nausea or vomiting Both daughters at bedside  Vital signs in last 24 hours: Temp:  [97.7 F (36.5 C)-98.3 F (36.8 C)] 97.7 F (36.5 C) (10/30 0741) Pulse Rate:  [66-88] 70 (10/30 0741) Resp:  [16] 16 (10/30 0741) BP: (96-117)/(41-49) 96/42 (10/30 0741) SpO2:  [94 %-96 %] 94 % (10/30 0741) Last BM Date : 07/18/23 Gen: awake, alert, NAD HEENT: anicteric  CV: RRR, no  mrg Pulm: CTA b/l Abd: soft, NT/ND, +BS throughout Ext: no c/c/e Neuro: nonfocal  Intake/Output from previous day: 10/29 0701 - 10/30 0700 In: 1070 [P.O.:720; I.V.:350] Out: 115 [Drains:115] Intake/Output this shift: No intake/output data recorded.  Lab Results: Recent Labs    07/18/23 0858 07/19/23 0704 07/20/23 0819  WBC 6.5 6.6 5.9  HGB 9.2* 9.2* 9.7*  HCT 28.3* 29.3* 30.6*  PLT 192 202 215   BMET Recent Labs    07/19/23 0704 07/20/23 0819  NA 134* 137  K 4.2 5.0  CL 105 104  CO2 21* 20*  GLUCOSE 76 114*  BUN 11 17  CREATININE 0.97 1.40*  CALCIUM 7.9* 8.0*   LFT Recent Labs    07/20/23 0819  PROT 6.1*  ALBUMIN 2.2*  AST 87*  ALT 51*  ALKPHOS 335*  BILITOT 0.8   PT/INR No results for input(s): "LABPROT", "INR" in the last 72 hours. Hepatitis Panel No results for input(s): "HEPBSAG", "HCVAB", "HEPAIGM", "HEPBIGM" in the last 72 hours.  Studies/Results: DG ERCP  Result Date: 07/20/2023 CLINICAL DATA:  ERCP for choledocholithiasis EXAM: ERCP TECHNIQUE: Multiple spot images obtained with the fluoroscopic device and submitted for interpretation post-procedure. FLUOROSCOPY TIME: FLUOROSCOPY TIME 2 minutes, 22 seconds (19.6 mGy) COMPARISON:  MRCP-07/10/2023; intraoperative cholangiogram during cholecystectomy-07/15/2023 FINDINGS: Multiple fluoroscopic images of the right upper abdominal quadrant during ERCP are provided for review Initial image demonstrates an ERCP probe overlying the right upper abdominal quadrant. Surgical drainage catheter overlies the right upper abdominal quadrant. Surgical clips overlie the right mid lower abdomen/pelvis Subsequent images demonstrate selective cannulation and opacification of the common bile duct. There is  a persistent nonocclusive filling defects within the CBD suggestive of choledocholithiasis (image 2) Subsequent images demonstrate insufflation of a balloon within the central aspect of the CBD with subsequent biliary  sweeping presumed stone extraction and sphincterotomy There is minimal opacification of the intrahepatic biliary tree which appears mildly dilated. There is minimal opacification of the pancreatic duct without definitive evidence of a bile leak. There is no definitive opacification of the pancreatic duct. IMPRESSION: ERCP with findings of choledocholithiasis with subsequent presumed stone extraction, biliary sweeping and sphincterotomy as above. These images were submitted for radiologic interpretation only. Please see the procedural report for the amount of contrast and the fluoroscopy time utilized. Electronically Signed   By: Simonne Come M.D.   On: 07/20/2023 09:58      LOS: 4 days   Beverley Fiedler, MD 07/20/2023, 3:10 PM See Loretha Stapler, [facility], to contact our on call provider

## 2023-07-20 NOTE — Anesthesia Postprocedure Evaluation (Signed)
Anesthesia Post Note  Patient: April Short  Procedure(s) Performed: ENDOSCOPIC RETROGRADE CHOLANGIOPANCREATOGRAPHY (ERCP) SPHINCTEROTOMY REMOVAL OF STONES     Patient location during evaluation: PACU Anesthesia Type: General Level of consciousness: awake and alert Pain management: pain level controlled Vital Signs Assessment: post-procedure vital signs reviewed and stable Respiratory status: spontaneous breathing, nonlabored ventilation, respiratory function stable and patient connected to nasal cannula oxygen Cardiovascular status: blood pressure returned to baseline and stable Postop Assessment: no apparent nausea or vomiting Anesthetic complications: no   No notable events documented.  Last Vitals:  Vitals:   07/20/23 0452 07/20/23 0741  BP: (!) 96/41 (!) 96/42  Pulse: 69 70  Resp:  16  Temp: 36.5 C 36.5 C  SpO2: 96% 94%    Last Pain:  Vitals:   07/20/23 0741  TempSrc: Oral  PainSc:                  Kennieth Rad

## 2023-07-20 NOTE — Evaluation (Signed)
Occupational Therapy Evaluation Patient Details Name: April Short MRN: [id number] DOB: 12/12/1934 Today's Date: 07/20/2023   History of Present Illness 88 year old female with history of coronary disease, chronic systolic CHF, status post ICD/pacemaker who presented with abdominal pain initially [facility] hospital.  She was found to have cholelithiasis, possible pancreatitis as well as portal vein thrombosis.  She underwent robotic assisted laparoscopic cholecystectomy on 10/25.  Intraoperatively, she was found to have possible choledocholithiasis.  Because of her ICD/pacemaker status, could not undergo MRCP at [facility], she was transferred to [facility].  General surgery consulted here.  Underwent ERCP on 10/29.  PMH  MCI, CKD stage 3, DMII, ischemic cardiomyopathy,   Clinical Impression   Pt c/o no pain at rest, feeling better. Pt with daughter and interpreter. Pt PLOF independent, occasional help with set up or transport chair out in community, mostly independent in home. Pt currently very close to baseline, supervision for mobility around room without AD, no LOB, good safety awareness, able to transport items as needed for dressing/bathing around room. Pt has strong family support at home, has all DME needed to remain safe. Pt has no acute OT or follow up needs.        If plan is discharge home, recommend the following: A little help with walking and/or transfers;A little help with bathing/dressing/bathroom;Help with stairs or ramp for entrance;Assist for transportation;Assistance with cooking/housework    Functional Status Assessment  Patient has had a recent decline in their functional status and demonstrates the ability to make significant improvements in function in a reasonable and predictable amount of time.  Equipment Recommendations  None recommended by OT    Recommendations for Other Services       Precautions / Restrictions Precautions Precautions: Fall Restrictions Weight  Bearing Restrictions: No      Mobility Bed Mobility               General bed mobility comments: arrived in recliner    Transfers Overall transfer level: Needs assistance Equipment used: None Transfers: Sit to/from Stand, Bed to chair/wheelchair/BSC Sit to Stand: Supervision           General transfer comment: supervision, good overall strength/balance      Balance Overall balance assessment: Needs assistance Sitting-balance support: No upper extremity supported, Feet supported Sitting balance-Leahy Scale: Good     Standing balance support: No upper extremity supported Standing balance-Leahy Scale: Good Standing balance comment: good balance                           ADL either performed or assessed with clinical judgement   ADL Overall ADL's : At baseline;Modified independent                                       General ADL Comments: close to or at baseline, supervision for mobility without AD, able to transport items, no LOB, good safety awareness.     Vision Baseline Vision/History: 4 Cataracts Ability to See in Adequate Light: 0 Adequate Patient Visual Report: No change from baseline       Perception         Praxis         Pertinent Vitals/Pain Pain Assessment Pain Assessment: No/denies pain     Extremity/Trunk Assessment Upper Extremity Assessment Upper Extremity Assessment: Overall WFL for tasks assessed  Communication Communication Communication: No apparent difficulties   Cognition Arousal: Alert Behavior During Therapy: WFL for tasks assessed/performed Overall Cognitive Status: Within Functional Limits for tasks assessed                                       General Comments       Exercises     Shoulder Instructions      Home Living Family/patient expects to be discharged to:: Private residence Living Arrangements: Children Available Help at Discharge:  Family Type of Home: House Home Access: Stairs to enter Secretary/administrator of Steps: 2 Entrance Stairs-Rails: None Home Layout: One level     Bathroom Shower/Tub: Chief Strategy Officer: Standard Bathroom Accessibility: No   Home Equipment: Shower seat;Hand held shower head;Grab bars - tub/shower;Rolling Environmental consultant (2 wheels);Wheelchair - manual;Cane - single point;Rollator (4 wheels);Transport chair   Additional Comments: Pt lives with daughter, has many other family members who can assist daily as needed.      Prior Functioning/Environment Prior Level of Function : Needs assist             Mobility Comments: uses transport chair in community, and no AD in home ADLs Comments: some set up for bathing but largely independent, dresses and cooks        OT Problem List: Pain      OT Treatment/Interventions:      OT Goals(Current goals can be found in the care plan section) Acute Rehab OT Goals Patient Stated Goal: to return home OT Goal Formulation: With patient/family Time For Goal Achievement: 08/03/23 Potential to Achieve Goals: Good  OT Frequency:      Co-evaluation              AM-PAC OT "6 Clicks" Daily Activity     Outcome Measure Help from another person eating meals?: None Help from another person taking care of personal grooming?: None Help from another person toileting, which includes using toliet, bedpan, or urinal?: A Little Help from another person bathing (including washing, rinsing, drying)?: A Little Help from another person to put on and taking off regular upper body clothing?: A Little Help from another person to put on and taking off regular lower body clothing?: A Little 6 Click Score: 20   End of Session Equipment Utilized During Treatment: Gait belt Nurse Communication: Mobility status  Activity Tolerance: Patient tolerated treatment well Patient left: in chair;with call bell/phone within reach;with family/visitor  present  OT Visit Diagnosis: Pain Pain - part of body:  (abdomen)                Time: 3016-0109 OT Time Calculation (min): 24 min Charges:  OT General Charges $OT Visit: 1 Visit OT Evaluation $OT Eval Low Complexity: 1 Low OT Treatments $Self Care/Home Management : 8-22 mins  [place], OTR/L   April Short 07/20/2023, 3:19 PM

## 2023-07-20 NOTE — Progress Notes (Signed)
PHARMACY - ANTICOAGULATION CONSULT NOTE  Pharmacy Consult for heparin Indication:  portal vein thrombosis  Labs: Recent Labs    07/17/23 0825 07/18/23 0858 07/18/23 0859 07/19/23 0704 07/20/23 0043  HGB 10.5* 9.2*  --  9.2*  --   HCT 33.9* 28.3*  --  29.3*  --   PLT 232 192  --  202  --   LABPROT 13.7  --   --   --   --   INR 1.0  --   --   --   --   HEPARINUNFRC 0.47  --  0.48 0.55 0.46  CREATININE 1.09*  --   --  0.97  --     Assessment/Plan:  88yo female therapeutic on heparin after resumed s/p ERCP. Will continue infusion at current rate of 900 units/hr and monitor daily level.  Vernard Gambles, PharmD, BCPS 07/20/2023 3:24 AM

## 2023-07-20 NOTE — Progress Notes (Signed)
PROGRESS NOTE  HEENA VALLEROY  FAO:[id number] DOB: 12-Jun-1935 DOA: 07/16/2023 PCP: Eustaquio Boyden, MD   Brief Narrative: Patient is 88 year old female with history of coronary disease, chronic systolic CHF, status post ICD/pacemaker who presented with abdominal pain initially [facility] hospital.  She was found to have cholelithiasis, possible pancreatitis as well as portal vein thrombosis.  She underwent robotic assisted laparoscopic cholecystectomy on 10/25.  Intraoperatively, she was found to have possible choledocholithiasis.  Because of her ICD/pacemaker status, could not undergo MRCP at [facility], she was transferred to [facility].  General surgery consulted here.  Underwent ERCP on 10/29.  PT/OT evaluation pending.  Assessment & Plan:  Principal Problem:   Choledocholithiasis Active Problems:   Portal vein thrombosis   Cholelithiasis   Type 2 diabetes mellitus with peripheral neuropathy (HCC)   Ischemic cardiomyopathy   CAD S/P percutaneous coronary angioplasty   ICD (implantable cardioverter-defibrillator) in place   MCI (mild cognitive impairment) with memory loss   CKD stage 3 due to type 2 diabetes mellitus (HCC)   Anemia in chronic kidney disease (CKD)   Acute gallstone pancreatitis   Gallstone pancreatitis/choledocholithiasis: Status post laparoscopic cholecystectomy at [facility].  Has a JP drain.  Lipase level has improved.  MRCP showed choledocholithiasis.  GI consulted, underwent ERCP on 10/29.  Status post complete removal of choledocholithiasis with biliary sphincterotomy, balloon extraction.  GI recommended to resume DOAC 10/31.  Antibiotics discontinued.  Will start on soft diet today.  Portal vein thrombosis: ERCP did not show any mass or suspicious lesion.  Currently on heparin drip.  Will start DOAC likely eliquis on 10/31.  Case was discussed with vascular surgery at [facility] and was recommended oral anticoagulation for 6 months.  AKI/elevated liver enzymes: Mild.  Continue  with gentle IV fluids for today.  Check CMP tomorrow.  Chronic systolic CHF/ICD placement: Previously had EF of 20 to 25% based on echo on 2017.  Echocardiogram on December 23 showed systolic function of 50 to 55%.  On carvedilol, [name], Farxiga prior to admission.  Currently on hold due to soft blood pressure.  Currently euvolemic  Chronic kidney disease stage IIIa: Baseline creatinine around 1.2.  Currently kidney function at baseline  Urinary bladder wall thickening/acute cystitis/bilateral hydronephrosis: She was tender on suprapubic area.  UA was abnormal.  Stopped ceftriaxone.  Urine culture did not show any growth.  Bladder scan without any retention.  Will need to follow-up imaging studies as outpatient to ensure that hydronephrosis has resolved.  We recommend to follow-up with [facility] or urology at [facility]  Coronary artery disease: Status post PCI.  No anginal symptoms  Diabetes type 2 with peripheral neuropathy: Currently on sliding scale  Normocytic anemia/leukocytosis: Leukocytosis resolved.  Currently hemoglobin stable        DVT prophylaxis:iv heparin     Code Status: Full Code  Family Communication: Discussed with daughter at bedside  Patient status:Inpatient  Patient is from :home  Anticipated discharge ON:GEXB  Estimated DC date:tomorrow   Consultants: GI, surgery  Procedures: Ostomy, ERCP  Antimicrobials:  Anti-infectives (From admission, onward)    Start     Dose/Rate Route Frequency Ordered Stop   07/19/23 1200  ampicillin-sulbactam (UNASYN) 1.5 g in sodium chloride 0.9 % 100 mL IVPB        1.5 g 200 mL/hr over 30 Minutes Intravenous  Once 07/18/23 1644 07/19/23 1227   07/17/23 1345  cefTRIAXone (ROCEPHIN) 1 g in sodium chloride 0.9 % 100 mL IVPB  1 g 200 mL/hr over 30 Minutes Intravenous Every 24 hours 07/17/23 1251         Subjective: Patient seen and examined at bedside today.  Hemodynamically stable lying in bed.   Hungry and wants to eat solid food.  Denies abdomen pain, nausea or vomiting.  Objective: Vitals:   07/19/23 1604 07/19/23 2045 07/20/23 0452 07/20/23 0741  BP: (!) 103/49 (!) 117/47 (!) 96/41 (!) 96/42  Pulse: 88 66 69 70  Resp: 16   16  Temp: 98.3 F (36.8 C) 97.8 F (36.6 C) 97.7 F (36.5 C) 97.7 F (36.5 C)  TempSrc:  Oral Oral Oral  SpO2:  94% 96% 94%  Weight:        Intake/Output Summary (Last 24 hours) at 07/20/2023 1115 Last data filed at 07/19/2023 1915 Gross per 24 hour  Intake 1070 ml  Output 85 ml  Net 985 ml   Filed Weights   07/19/23 1216  Weight: 52.8 kg    Examination:  General exam: Overall comfortable, not in distress HEENT: PERRL Respiratory system:  no wheezes or crackles  Cardiovascular system: S1 & S2 heard, RRR.  Gastrointestinal system: Abdomen is nondistended, soft and nontender.  Right upper quadrant drain Central nervous system: Alert and oriented Extremities: No edema, no clubbing ,no cyanosis Skin: No rashes, no ulcers,no icterus     Data Reviewed: I have personally reviewed following labs and imaging studies  CBC: Recent Labs  Lab 07/13/23 1253 07/13/23 1953 07/16/23 0458 07/17/23 0825 07/18/23 0858 07/19/23 0704 07/20/23 0819  WBC 8.1   < > 12.7* 7.8 6.5 6.6 5.9  NEUTROABS 6.4  --   --   --   --   --   --   HGB 11.4*   < > 10.6* 10.5* 9.2* 9.2* 9.7*  HCT 35.0*   < > 33.7* 33.9* 28.3* 29.3* 30.6*  MCV 89.3   < > 92.3 91.6 91.0 91.6 90.5  PLT 316.0   < > 256 232 192 202 215   < > = values in this interval not displayed.   Basic Metabolic Panel: Recent Labs  Lab 07/15/23 0410 07/16/23 0458 07/17/23 0825 07/19/23 0704 07/20/23 0819  NA 137 136 136 134* 137  K 4.1 5.3* 4.4 4.2 5.0  CL 107 105 102 105 104  CO2 24 22 21* 21* 20*  GLUCOSE 79 93 82 76 114*  BUN 16 21 17 11 17   CREATININE 0.97 1.19* 1.09* 0.97 1.40*  CALCIUM 7.8* 7.8* 8.1* 7.9* 8.0*  MG  --   --   --  1.7  --      Recent Results (from the past 240  hour(s))  Urine Culture     Status: None   Collection Time: 07/13/23 12:32 PM   Specimen: Urine  Result Value Ref Range Status   MICRO NUMBER: [id number]  Final   SPECIMEN QUALITY: Adequate  Final   Sample Source URINE  Final   STATUS: FINAL  Final   Result:   Final    Mixed genital flora isolated. These superficial bacteria are not indicative of a urinary tract infection. No further organism identification is warranted on this specimen. If clinically indicated, recollect clean-catch, mid-stream urine and transfer  immediately to Urine Culture Transport Tube.   Urine Culture (for pregnant, neutropenic or urologic patients or patients with an indwelling urinary catheter)     Status: None   Collection Time: 07/17/23 12:10 PM   Specimen: Urine, Clean Catch  Result Value Ref  Range Status   Specimen Description URINE, CLEAN CATCH  Final   Special Requests NONE  Final   Culture   Final    NO GROWTH Performed at [facility], [address], [place], [place] [address]    Report Status 07/19/2023 FINAL  Final     Radiology Studies: DG ERCP  Result Date: 07/20/2023 CLINICAL DATA:  ERCP for choledocholithiasis EXAM: ERCP TECHNIQUE: Multiple spot images obtained with the fluoroscopic device and submitted for interpretation post-procedure. FLUOROSCOPY TIME: FLUOROSCOPY TIME 2 minutes, 22 seconds (19.6 mGy) COMPARISON:  MRCP-07/10/2023; intraoperative cholangiogram during cholecystectomy-07/15/2023 FINDINGS: Multiple fluoroscopic images of the right upper abdominal quadrant during ERCP are provided for review Initial image demonstrates an ERCP probe overlying the right upper abdominal quadrant. Surgical drainage catheter overlies the right upper abdominal quadrant. Surgical clips overlie the right mid lower abdomen/pelvis Subsequent images demonstrate selective cannulation and opacification of the common bile duct. There is a persistent nonocclusive filling defects within the CBD suggestive of  choledocholithiasis (image 2) Subsequent images demonstrate insufflation of a balloon within the central aspect of the CBD with subsequent biliary sweeping presumed stone extraction and sphincterotomy There is minimal opacification of the intrahepatic biliary tree which appears mildly dilated. There is minimal opacification of the pancreatic duct without definitive evidence of a bile leak. There is no definitive opacification of the pancreatic duct. IMPRESSION: ERCP with findings of choledocholithiasis with subsequent presumed stone extraction, biliary sweeping and sphincterotomy as above. These images were submitted for radiologic interpretation only. Please see the procedural report for the amount of contrast and the fluoroscopy time utilized. Electronically Signed   By: Simonne Come M.D.   On: 07/20/2023 09:58   MR ABDOMEN MRCP W WO CONTAST  Result Date: 07/18/2023 CLINICAL DATA:  Cholelithiasis, possible choledocholithiasis. Right portal vein thrombosis. EXAM: MRI ABDOMEN WITHOUT AND WITH CONTRAST (INCLUDING MRCP) TECHNIQUE: Multiplanar multisequence MR imaging of the abdomen was performed both before and after the administration of intravenous contrast. Heavily T2-weighted images of the biliary and pancreatic ducts were obtained, and three-dimensional MRCP images were rendered by post processing. CONTRAST:  6mL GADAVIST GADOBUTROL 1 MMOL/ML IV SOLN COMPARISON:  07/14/2023 FINDINGS: Despite efforts by the technologist and patient, motion artifact is present on today's exam and could not be eliminated. This reduces exam sensitivity and specificity. This is a common result when MRCP is attempted in the inpatient setting where patients are less likely to be able to breath hold and cooperate in controlling motion. Lower chest: Mild dependent atelectasis in both lower lobes with trace bilateral pleural effusions. Hepatobiliary: Suboptimal depiction of the biliary tree due to prominent motion artifact on the MRCP  images. 8 mm filling defect compatible with stone in the distal common bile duct as on image 16 series 9. Mildly truncated distal CBD on coronal images although less strikingly so on axial images. No intrahepatic or significant extrahepatic biliary dilatation. Thrombus is present in a tributary of the portal vein in segment 7. No hepatic infarct. I do not see well-defined mass such as cholangiocarcinoma or hepatocellular carcinoma to explain the segmental portal vein thrombosis in segment 7, although sometime such masses can be subtle and the motion artifact on today's exam could obscure such a lesion. No obvious abnormal arterial phase enhancement in this vicinity. The gallbladder is absent. Expected postoperative edema along the gallbladder fossa. Pancreas:  Unremarkable Spleen:  Unremarkable Adrenals/Urinary Tract:  Unremarkable Stomach/Bowel: Absent left colon compatible left hemicolectomy. Anastomotic staple line in the upper pelvis. Vascular/Lymphatic: Atherosclerosis  is present, including aortoiliac atherosclerotic disease. Proximal narrowing of the celiac trunk could be from atheromatous vascular disease or median arcuate ligament. However, the celiac trunk does not appear occluded. Other: Hazy infiltrative process adjacent to the pancreatic head and porta hepatis, probably due to a combination of lymph nodes and local inflammation possibly from focal pancreatitis. Similar findings seen on the CT from 07/14/2023 but not from 06/20/2023, favoring an inflammatory process over malignancy. The patient has had an elevated lipase recently. No overt pseudocysts seen. Musculoskeletal: Levoconvex lumbar scoliosis with spondylosis and degenerative disc disease. IMPRESSION: 1. 8 mm stone in the distal common bile duct. No intrahepatic or significant extrahepatic biliary dilatation. 2. Thrombus in a tributary of the portal vein in segment 7. No hepatic infarct. I do not see well-defined mass such as cholangiocarcinoma  or hepatocellular carcinoma to explain the segmental portal vein thrombosis in segment 7, although sometimes such masses can be subtle and the motion artifact on today's exam could obscure such a lesion. 3. Hazy infiltrative process adjacent to the pancreatic head and porta hepatis, probably due to a combination of lymph nodes and local inflammation possibly from focal pancreatitis. Similar findings seen on the CT from 07/14/2023 but not from 06/20/2023, this timeframe favors an inflammatory process over malignancy. The patient has had an elevated lipase recently. No pseudocyst seen. 4. Mild dependent atelectasis in both lower lobes with trace bilateral pleural effusions. 5. Aortic atherosclerosis. 6. Proximal narrowing of the celiac trunk could be from atheromatous vascular disease or median arcuate ligament. However, the celiac trunk does not appear occluded. 7. Levoconvex lumbar scoliosis with spondylosis and degenerative disc disease. 8. Status post left hemicolectomy. Electronically Signed   By: Gaylyn Rong M.D.   On: 07/18/2023 18:48    Scheduled Meds:  insulin aspart  0-6 Units Subcutaneous TID WC   pantoprazole (PROTONIX) IV  40 mg Intravenous Q24H   Continuous Infusions:  cefTRIAXone (ROCEPHIN)  IV 1 g (07/18/23 1533)   heparin 900 Units/hr (07/19/23 1708)     LOS: 4 days   Burnadette Pop, MD [facility] P10/30/2024, 11:15 AM

## 2023-07-21 ENCOUNTER — Telehealth: Payer: Self-pay

## 2023-07-21 ENCOUNTER — Other Ambulatory Visit (HOSPITAL_COMMUNITY): Payer: Self-pay

## 2023-07-21 DIAGNOSIS — K8050 Calculus of bile duct without cholangitis or cholecystitis without obstruction: Secondary | ICD-10-CM | POA: Diagnosis not present

## 2023-07-21 LAB — COMPREHENSIVE METABOLIC PANEL
ALT: 31 U/L (ref 0–44)
AST: 34 U/L (ref 15–41)
Albumin: 2.2 g/dL — ABNORMAL LOW (ref 3.5–5.0)
Alkaline Phosphatase: 245 U/L — ABNORMAL HIGH (ref 38–126)
Anion gap: 8 (ref 5–15)
BUN: 13 mg/dL (ref 8–23)
CO2: 21 mmol/L — ABNORMAL LOW (ref 22–32)
Calcium: 7.9 mg/dL — ABNORMAL LOW (ref 8.9–10.3)
Chloride: 109 mmol/L (ref 98–111)
Creatinine, Ser: 0.99 mg/dL (ref 0.44–1.00)
GFR, Estimated: 56 mL/min — ABNORMAL LOW (ref >60)
Glucose, Bld: 109 mg/dL — ABNORMAL HIGH (ref 70–99)
Potassium: 4 mmol/L (ref 3.5–5.1)
Sodium: 138 mmol/L (ref 135–145)
Total Bilirubin: 0.5 mg/dL (ref 0.3–1.2)
Total Protein: 5.8 g/dL — ABNORMAL LOW (ref 6.5–8.1)

## 2023-07-21 LAB — GLUCOSE, CAPILLARY
Glucose-Capillary: 87 mg/dL (ref 70–99)
Glucose-Capillary: 130 mg/dL — ABNORMAL HIGH (ref 70–99)

## 2023-07-21 MED ORDER — SODIUM CHLORIDE FLUSH 0.9 % IV SOLN
3.0000 mL | Freq: Three times a day (TID) | INTRAVENOUS | 15 refills | Status: DC
  Filled 2023-07-21: qty 80, 9d supply, fill #0

## 2023-07-21 MED ORDER — PANTOPRAZOLE SODIUM 40 MG PO TBEC
40.0000 mg | DELAYED_RELEASE_TABLET | Freq: Every day | ORAL | 0 refills | Status: DC
  Filled 2023-07-21: qty 30, 30d supply, fill #0

## 2023-07-21 MED ORDER — APIXABAN 5 MG PO TABS
5.0000 mg | ORAL_TABLET | Freq: Two times a day (BID) | ORAL | 0 refills | Status: DC
  Filled 2023-07-21: qty 60, 30d supply, fill #0

## 2023-07-21 MED ORDER — APIXABAN 5 MG PO TABS: 5.0000 mg | ORAL_TABLET | Freq: Two times a day (BID) | ORAL | 0 refills | Status: DC

## 2023-07-21 MED ORDER — PANTOPRAZOLE SODIUM 40 MG PO TBEC: 40.0000 mg | DELAYED_RELEASE_TABLET | Freq: Every day | ORAL | Status: DC

## 2023-07-21 MED ORDER — APIXABAN 5 MG PO TABS
5.0000 mg | ORAL_TABLET | Freq: Two times a day (BID) | ORAL | Status: DC
  Administered 2023-07-21: 5 mg via ORAL
  Filled 2023-07-21: qty 1

## 2023-07-21 NOTE — Telephone Encounter (Signed)
Pt scheduled to see Mike Gip PA 09/01/23 at 11:30am. Pt mailed appt letter.

## 2023-07-21 NOTE — Progress Notes (Signed)
[name] to be D/C'd  per MD order.  Discussed with the patient and all questions fully answered.  VSS, Skin clean, dry and intact without evidence of skin break down, no evidence of skin tears noted.  IV catheter discontinued intact. Site without signs and symptoms of complications. Dressing and pressure applied.  An After Visit Summary was printed and given to the patient. Patient received prescription.  D/c education completed with patient/family including follow up instructions, medication list, d/c activities limitations if indicated, with other d/c instructions as indicated by MD - patient able to verbalize understanding, all questions fully answered.   Patient instructed to return to ED, call 911, or call MD for any changes in condition.   Patient to be escorted via WC, and D/C home via private auto.

## 2023-07-21 NOTE — Discharge Instructions (Addendum)
Information del medicamento  ELIQUIS (apixaban) POR QU ESTOY TOMANDO APIXABN (ELIQUIS)? Su mdico le ha recomendado que tome el medicamento apixabn [place]). Este medicamento es un anticoagulante; anti significa contra y coagulant significa coagulante, as que es un medicamento que previene los cogulos sanguneos. A veces, a este medicamento se le llama diluyente sanguneo. Un anticoagulante ayuda a prevenir la formacin de Technical sales engineer.  Mientras toma este medicamento, es necesario mantenerle bajo estrecha supervisin para Scientist, research (life sciences) y moretones. Su proveedor de IT consultant con usted para Designer, multimedia seguro y sano mientras toma apixabn [place]). Por favor, notifquele a su proveedor de atencin mdica si tiene alguna pregunta.   Entre ms sepan sobre este medicamento mejor equipo harn usted y su proveedor de atencin mdica. Le rogamos que se tome su tiempo para leer toda la informacin en este folleto.  CMO SE DEBE TOMAR EL APIXABN (ELIQUIS )?  Tome su medicamento exactamente como se le recete. El apixabn [place]) se toma dos veces al da a la misma hora todos [place].  El medicamento puede tomarse con o sin comida.  Los comprimidos pueden triturarse si tiene dificultades para tragrselos enteros.  Si se le olvida tomar una dosis, tmesela en cuanto lo recuerde. No tome ms de una dosis al mismo tiempo para compensar la dosis [place].  PUEDO TOMAR APIXABN (ELIQUIS ) CON OTROS MEDICAMENTOS? Es Art therapist con su mdico o Archivist de todos los dems medicamentos que est tomando, incluidos los medicamentos de venta [place], los antibiticos, las vitaminas o los suplementos de hierbas.   Podra tener mayor riesgo de hemorragia si toma otros medicamentos como:  aspirina o productos que contengan aspirina.  medicamentos antiinflamatorios no esteroides (NSAIDS): ibuprofeno (ADVIL) o naproxn (ALEVE).  otro  medicamento para ayudar a Chief of Staff sanguneos como la warfarina (COUMADIN ) o el clopidogrel (PLAVIX).  CULES SON LOS EFECTOS SECUNDARIOS DEL APIXABN (ELIQUIS)?  pequeos moretones.  ligero sangrado de las encas al [employer].  sangrado ocasional de la nariz.  sangrado despus de una cortadura menor que para en unos minutos.  CUNDO DEBO COMUNICARME CON MI PROVEEDOR MDICO PARA RECIBIR ATENCIN MDICA? Si est sintiendo algo anormal que usted piensa que podra estar causado por el apixabn [place] ), por favor comunquese con su proveedor de atencin mdica.  Algunas cosas a las que debe estar alerta incluyen:  hemorragia mayor.  orina de color rojo, oscuro o caf.  heces de color rojo o alquitranado.  hemorragia que no para despus de 10 minutos.  vmito que es de color caf o rojo brillante.  Otras inquietudes podran incluir:  dolor inesperado, hinchazn o dolor en las articulaciones.  dolores de [place] o debilidad y [place].  una fuerte cada o un golpe en la cabeza.  si est embarazada o planea quedar embarazada.  SI SUFRE ALGUNA HEMORRAGIA MAYOR COMUNQUESE DE INMEDIATO CON SU MDICO O ACUDA A LA SALA DE EMERGENCIAS DEL HOSPITAL  PUEDO DEJAR DE TOMAR APIXABN (ELIQUIS)? No deje de tomar apixabn (ELIQUIS) salvo que su proveedor de atencin [employer] diga que lo haga. El apixabn [place]) posiblemente necesite dejarse de tomar antes de un procedimiento o [place]. Pdale a su proveedor instrucciones especficas sobre cundo dejar de tomar el medicamento y cuando volver a tomrselo.   Si desea o necesita dejar de tomar este medicamento por cualquier razn, comunquese de inmediato con su proveedor de atencin mdica antes de dejar de hacerlo.  DEJAR DE TOMAR ESTE MEDICAMENTO PODRA AUMENTAR SU RIESGO  DE TENER UN COGULO SANGUNEO.   NECESITAR UN ANLISIS DE SANGRE REGULAR? Es posible que su proveedor de atencin mdica  realice anlisis de sangre antes de iniciar el medicamento para que le ayuden a Clinical research associate la dosis Geologist, engineering. Necesitar realizarse estos anlisis de [place] por lo menos una vez al ao.   El apixabn [place] ) no requiere el control mensual de sangre como algunos otros anticoagulantes.  NECESITO CAMBIAR MI DIETA? No hay restricciones dietticas que considerar mientras toma apixabn [place])

## 2023-07-21 NOTE — Progress Notes (Signed)
   07/21/23 1109  Mobility  Activity Ambulated with assistance in hallway  Level of Assistance Contact guard assist, steadying assist  Assistive Device None;Other (Comment) (HHA)  Distance Ambulated (ft) 200 ft  Activity Response Tolerated fair  Mobility Referral Yes  $Mobility charge 1 Mobility  Mobility Specialist Start Time (ACUTE ONLY) 1048  Mobility Specialist Stop Time (ACUTE ONLY) 1109  Mobility Specialist Time Calculation (min) (ACUTE ONLY) 21 min   Mobility Specialist: Progress Note  Pt agreeable to mobility session - received in bed. Required CG using no AD then HHA after a seated rest break. Returned to chair with all needs met - call bell within reach. Daughter present.   Pt had abd discomfort at BOS, was no longer present post ambulation.   Barnie Mort, BS Mobility Specialist Please contact via SecureChat or Rehab office at [phone].

## 2023-07-21 NOTE — Telephone Encounter (Signed)
-----   Message from Carie Caddy Pyrtle sent at 07/20/2023  3:16 PM EDT ----- JP pt. Needs to see he or Amy E in 6-8 weeks(ish) Recent gallstone panc, cbd stone s/p ERCP, portal vein thrombosis Thanks JMP

## 2023-07-21 NOTE — Discharge Summary (Signed)
Physician Discharge Summary  RASCHELLE MANJARRES WJX:[id number] DOB: 13-Mar-1935 DOA: 07/16/2023  PCP: Eustaquio Boyden, MD  Admit date: 07/16/2023 Discharge date: 07/21/2023  Admitted From: Home Disposition:  Home  Discharge Condition:Stable CODE STATUS:FULL Diet recommendation: Heart Healthy  Brief/Interim Summary: Patient is 88 year old female with history of coronary disease, chronic systolic CHF, status post ICD/pacemaker who presented with abdominal pain initially [facility] hospital.  She was found to have cholelithiasis, possible pancreatitis as well as portal vein thrombosis.  She underwent robotic assisted laparoscopic cholecystectomy on 10/25.  Intraoperatively, she was found to have possible choledocholithiasis.  Because of her ICD/pacemaker status, could not undergo MRCP at [facility], she was transferred to [facility].  General surgery consulted here.  Underwent ERCP on 10/29.  PT/OT evaluation done, no follow-up recommended.  Medically stable for discharge to home today.  She will follow-up with general surgery team at outpatient  Following problems were addressed during the hospitalization:  Gallstone pancreatitis/choledocholithiasis: Status post laparoscopic cholecystectomy at [facility].  Has a JP drain.  Lipase level has improved.  MRCP showed choledocholithiasis.  GI consulted, underwent ERCP on 10/29.  Status post complete removal of choledocholithiasis with biliary sphincterotomy, balloon extraction.  Antibiotics discontinued. Tolerating solid diet   Portal vein thrombosis: ERCP did not show any mass or suspicious lesion.  Currently on heparin drip.  Case was discussed with vascular surgery at [facility] and was recommended oral anticoagulation for 6 months.  Started on Eliquis   AKI/elevated liver enzymes: Resolved.   Chronic systolic CHF/ICD placement: Previously had EF of 20 to 25% based on echo on 2017.  Echocardiogram on December 23 showed systolic function of 50 to 55%.  On carvedilol,  [name], Farxiga prior to admission.  Currently on hold due to soft blood pressure.  Currently euvolemic.  We recommend to follow-up with cardiology as soon as possible as an outpatient.   Chronic kidney disease stage IIIa: Baseline creatinine around 1.2.  Currently kidney function better than baseline 9   Urinary bladder wall thickening/acute cystitis/bilateral hydronephrosis: Seen on the CT imaging here.  MRCP did not show any hydronephrosis or bladder wall thickening.  Kidney function stable  coronary artery disease: Status post PCI.  No anginal symptoms.  Takes aspirin   Diabetes type 2 with peripheral neuropathy: On Farxiga.  We recommend close follow-up with PCP to check if she needs addition of medication to control her diabetes.  We recommend to monitor blood sugars at home.  Last A1c level in the range of 9   normocytic anemia/leukocytosis: Leukocytosis resolved.  Currently hemoglobin stable   Discharge Diagnoses:  Principal Problem:   Choledocholithiasis Active Problems:   Portal vein thrombosis   Cholelithiasis   Type 2 diabetes mellitus with peripheral neuropathy (HCC)   Ischemic cardiomyopathy   CAD S/P percutaneous coronary angioplasty   ICD (implantable cardioverter-defibrillator) in place   MCI (mild cognitive impairment) with memory loss   CKD stage 3 due to type 2 diabetes mellitus (HCC)   Anemia in chronic kidney disease (CKD)   Acute gallstone pancreatitis   S/P ERCP    Discharge Instructions  Discharge Instructions     Diet Carb Modified   Complete by: As directed    Discharge instructions   Complete by: As directed    1)Please take prescribed medications as instructed.  Monitor blood sugars at home. 2)Follow up with your PCP in a week 3)Follow up with general surgery in 1 week.  Name and number the provider has been attached.  You might be  also called for appointment 4)You will be called by gastroenterology for follow-up appointment. 5)Follow up with  your cardiologist as an outpatient.  We have discontinued carvedilol and Entresto because his blood pressure is on the lower side and your heart function has improved   Increase activity slowly   Complete by: As directed       Allergies as of 07/21/2023   No Known Allergies      Medication List     STOP taking these medications    carvedilol 25 MG tablet Commonly known as: COREG   Entresto 49-51 MG Generic drug: sacubitril-valsartan       TAKE these medications    albuterol (2.5 MG/3ML) 0.083% nebulizer solution Commonly known as: PROVENTIL Take 3 mLs (2.5 mg total) by nebulization 3 (three) times daily as needed for wheezing or shortness of breath.   apixaban 5 MG Tabs tablet Commonly known as: ELIQUIS Take 1 tablet (5 mg total) by mouth 2 (two) times daily.   aspirin EC 81 MG tablet Take 81 mg by mouth daily. Swallow whole.   atorvastatin 40 MG tablet Commonly known as: LIPITOR TAKE 1 TABLET BY MOUTH EVERY DAY   Farxiga 5 MG Tabs tablet Generic drug: dapagliflozin propanediol TAKE 1 TABLET (5 MG TOTAL) BY MOUTH DAILY.   metoCLOPramide 5 MG tablet Commonly known as: Reglan Take 1 tablet (5 mg total) by mouth 3 (three) times daily as needed for nausea or vomiting (with meals).   pantoprazole 40 MG tablet Commonly known as: PROTONIX Take 1 tablet (40 mg total) by mouth at bedtime.   Prolia 60 MG/ML Sosy injection Generic drug: denosumab Inject 60 mg into the skin every 6 (six) months.   Vitamin D3 25 MCG (1000 UT) Caps Take 1 capsule (1,000 Units total) by mouth daily.        Follow-up Information     Eustaquio Boyden, MD. Schedule an appointment as soon as possible for a visit in 1 week(s).   Specialty: Family Medicine Contact information: [address] [place] [address] [phone]         Sung Amabile, DO. Schedule an appointment as soon as possible for a visit in 1 week(s).   Specialties: General Surgery, Surgery Contact  information: [address] [place] [address] [phone]                No Known Allergies  Consultations: GI, general surgery   Procedures/Studies: DG ERCP  Result Date: 07/20/2023 CLINICAL DATA:  ERCP for choledocholithiasis EXAM: ERCP TECHNIQUE: Multiple spot images obtained with the fluoroscopic device and submitted for interpretation post-procedure. FLUOROSCOPY TIME: FLUOROSCOPY TIME 2 minutes, 22 seconds (19.6 mGy) COMPARISON:  MRCP-07/10/2023; intraoperative cholangiogram during cholecystectomy-07/15/2023 FINDINGS: Multiple fluoroscopic images of the right upper abdominal quadrant during ERCP are provided for review Initial image demonstrates an ERCP probe overlying the right upper abdominal quadrant. Surgical drainage catheter overlies the right upper abdominal quadrant. Surgical clips overlie the right mid lower abdomen/pelvis Subsequent images demonstrate selective cannulation and opacification of the common bile duct. There is a persistent nonocclusive filling defects within the CBD suggestive of choledocholithiasis (image 2) Subsequent images demonstrate insufflation of a balloon within the central aspect of the CBD with subsequent biliary sweeping presumed stone extraction and sphincterotomy There is minimal opacification of the intrahepatic biliary tree which appears mildly dilated. There is minimal opacification of the pancreatic duct without definitive evidence of a bile leak. There is no definitive opacification of the pancreatic duct. IMPRESSION: ERCP with  findings of choledocholithiasis with subsequent presumed stone extraction, biliary sweeping and sphincterotomy as above. These images were submitted for radiologic interpretation only. Please see the procedural report for the amount of contrast and the fluoroscopy time utilized. Electronically Signed   By: Simonne Come M.D.   On: 07/20/2023 09:58   MR ABDOMEN MRCP W WO CONTAST  Result Date: 07/18/2023 CLINICAL  DATA:  Cholelithiasis, possible choledocholithiasis. Right portal vein thrombosis. EXAM: MRI ABDOMEN WITHOUT AND WITH CONTRAST (INCLUDING MRCP) TECHNIQUE: Multiplanar multisequence MR imaging of the abdomen was performed both before and after the administration of intravenous contrast. Heavily T2-weighted images of the biliary and pancreatic ducts were obtained, and three-dimensional MRCP images were rendered by post processing. CONTRAST:  6mL GADAVIST GADOBUTROL 1 MMOL/ML IV SOLN COMPARISON:  07/14/2023 FINDINGS: Despite efforts by the technologist and patient, motion artifact is present on today's exam and could not be eliminated. This reduces exam sensitivity and specificity. This is a common result when MRCP is attempted in the inpatient setting where patients are less likely to be able to breath hold and cooperate in controlling motion. Lower chest: Mild dependent atelectasis in both lower lobes with trace bilateral pleural effusions. Hepatobiliary: Suboptimal depiction of the biliary tree due to prominent motion artifact on the MRCP images. 8 mm filling defect compatible with stone in the distal common bile duct as on image 16 series 9. Mildly truncated distal CBD on coronal images although less strikingly so on axial images. No intrahepatic or significant extrahepatic biliary dilatation. Thrombus is present in a tributary of the portal vein in segment 7. No hepatic infarct. I do not see well-defined mass such as cholangiocarcinoma or hepatocellular carcinoma to explain the segmental portal vein thrombosis in segment 7, although sometime such masses can be subtle and the motion artifact on today's exam could obscure such a lesion. No obvious abnormal arterial phase enhancement in this vicinity. The gallbladder is absent. Expected postoperative edema along the gallbladder fossa. Pancreas:  Unremarkable Spleen:  Unremarkable Adrenals/Urinary Tract:  Unremarkable Stomach/Bowel: Absent left colon compatible left  hemicolectomy. Anastomotic staple line in the upper pelvis. Vascular/Lymphatic: Atherosclerosis is present, including aortoiliac atherosclerotic disease. Proximal narrowing of the celiac trunk could be from atheromatous vascular disease or median arcuate ligament. However, the celiac trunk does not appear occluded. Other: Hazy infiltrative process adjacent to the pancreatic head and porta hepatis, probably due to a combination of lymph nodes and local inflammation possibly from focal pancreatitis. Similar findings seen on the CT from 07/14/2023 but not from 06/20/2023, favoring an inflammatory process over malignancy. The patient has had an elevated lipase recently. No overt pseudocysts seen. Musculoskeletal: Levoconvex lumbar scoliosis with spondylosis and degenerative disc disease. IMPRESSION: 1. 8 mm stone in the distal common bile duct. No intrahepatic or significant extrahepatic biliary dilatation. 2. Thrombus in a tributary of the portal vein in segment 7. No hepatic infarct. I do not see well-defined mass such as cholangiocarcinoma or hepatocellular carcinoma to explain the segmental portal vein thrombosis in segment 7, although sometimes such masses can be subtle and the motion artifact on today's exam could obscure such a lesion. 3. Hazy infiltrative process adjacent to the pancreatic head and porta hepatis, probably due to a combination of lymph nodes and local inflammation possibly from focal pancreatitis. Similar findings seen on the CT from 07/14/2023 but not from 06/20/2023, this timeframe favors an inflammatory process over malignancy. The patient has had an elevated lipase recently. No pseudocyst seen. 4. Mild dependent atelectasis in both lower lobes  with trace bilateral pleural effusions. 5. Aortic atherosclerosis. 6. Proximal narrowing of the celiac trunk could be from atheromatous vascular disease or median arcuate ligament. However, the celiac trunk does not appear occluded. 7. Levoconvex lumbar  scoliosis with spondylosis and degenerative disc disease. 8. Status post left hemicolectomy. Electronically Signed   By: Gaylyn Rong M.D.   On: 07/18/2023 18:48   DG Chest Port 1 View  Result Date: 07/18/2023 CLINICAL DATA:  Defibrillator placement. EXAM: PORTABLE CHEST 1 VIEW COMPARISON:  None Available. FINDINGS: The heart size and mediastinal contours are within normal limits. Left-sided defibrillator is noted with leads in grossly good position. No pneumothorax or effusion is noted. Mild left lingular subsegmental atelectasis or scarring is noted. Minimal right midlung subsegmental atelectasis or scarring is noted. Mild subcutaneous emphysema is seen in the cervical and supraclavicular tissues bilaterally. The visualized skeletal structures are unremarkable. IMPRESSION: Left-sided fibrillator is noted in grossly good position. Mild left lingular subsegmental atelectasis or scarring. Minimal right midlung subsegmental atelectasis or scarring. Mild subcutaneous emphysema is seen in the cervical and supraclavicular soft tissues bilaterally. Electronically Signed   By: Lupita Raider M.D.   On: 07/18/2023 11:45   DG Cholangiogram Operative  Result Date: 07/16/2023 CLINICAL DATA:  Intraoperative cholangiogram during cholecystectomy EXAM: INTRAOPERATIVE CHOLANGIOGRAM TECHNIQUE: Cholangiographic images from the C-arm fluoroscopic device were submitted for interpretation post-operatively. Please see the procedural report for the amount of contrast and the fluoroscopy time utilized. FLUOROSCOPY: Radiation Exposure Index (as provided by the fluoroscopic device): 10.2 mGy Kerma COMPARISON:  CT abdomen pelvis 06/20/2023 FINDINGS: Four intraoperative fluoroscopic images and 1 cine clip were provided for interpretation. The submitted images demonstrate opacification of the intra and extrahepatic bile ducts through cystic duct remnant. Multiple filling defects in the distal common bile duct are most likely  due to choledocholithiasis. IMPRESSION: Multiple filling defects seen in the distal common bile duct most likely due to choledocholithiasis. Correlation with MRCP or ERCP would be beneficial to exclude underlying mass. Review of most recent abdominal CT shows a duodenal diverticulum at the insertion of the common bile duct which may make ERCP challenging. Electronically Signed   By: Acquanetta Belling M.D.   On: 07/16/2023 06:17   DG C-Arm 1-60 Min-No Report  Result Date: 07/15/2023 Fluoroscopy was utilized by the requesting physician.  No radiographic interpretation.   DG C-Arm 1-60 Min-No Report  Result Date: 07/15/2023 Fluoroscopy was utilized by the requesting physician.  No radiographic interpretation.   DG Abd 2 Views  Result Date: 07/14/2023 CLINICAL DATA:  Abdominal pain and constipation EXAM: ABDOMEN - 2 VIEW COMPARISON:  CT 06/20/2023 FINDINGS: Gas and stool throughout the colon. No small or large bowel distention. No abnormal air-fluid levels. No free intra-abdominal air. No radiopaque stones. Surgical clips in the lower abdomen and pelvis. Lumbar scoliosis convex towards the left. Degenerative changes in the spine. Soft tissue contours are normal. Lung bases are clear. IMPRESSION: Nonobstructive bowel gas pattern. Electronically Signed   By: Burman Nieves M.D.   On: 07/14/2023 02:38   CT ABDOMEN PELVIS W CONTRAST  Result Date: 07/14/2023 CLINICAL DATA:  Acute nonlocalized abdominal pain. Evaluate right branch portal vein thrombosis. Abdominal pain and not eating for the past 9 days. EXAM: CT ABDOMEN AND PELVIS WITH CONTRAST TECHNIQUE: Multidetector CT imaging of the abdomen and pelvis was performed using the standard protocol following bolus administration of intravenous contrast. RADIATION DOSE REDUCTION: This exam was performed according to the departmental dose-optimization program which includes automated exposure control, adjustment  of the mA and/or kV according to patient size  and/or use of iterative reconstruction technique. CONTRAST:  75mL OMNIPAQUE IOHEXOL 300 MG/ML  SOLN COMPARISON:  Ultrasound abdomen 07/14/2023. Abdominal radiograph 07/13/2023. CT 06/20/2023 FINDINGS: Lower chest: Emphysematous changes and scarring in the lung bases. Mild bronchiectasis. Hepatobiliary: There is focal decreased flow in the right anterior portal vein with zone of low-attenuation consistent with portal venous thrombosis. This corresponds to ultrasound finding and is new since prior study. The remaining portal veins are patent. Cholelithiasis. No bile duct dilatation. Stone or sludge suggested in the distal common bile duct. Pancreas: Since the previous study, there is interval development of low-attenuation change in the head of the pancreas and extending along the celiac axis around the hepatic artery. This is causing extrinsic narrowing of the hepatic artery. Given the short time frame during which this developed, it is most likely inflammatory. 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 Follow-up to resolution is recommended to exclude a rapidly developing neoplasm. The remainder of the pancreas appears otherwise normal. Spleen: Normal in size without focal abnormality. Adrenals/Urinary Tract: No adrenal gland nodules. Bilateral hydronephrosis and hydroureter to the level of the bladder. No obstructing lesion is demonstrated. This could represent reflux or stasis. Bladder wall is diffusely thickened suggesting cystitis. Correlate with urinalysis. Stomach/Bowel: Stomach, small bowel, and colon are not abnormally distended. No wall thickening or inflammatory changes. Appendix is normal. Vascular/Lymphatic: Aortic atherosclerosis. No enlarged abdominal or pelvic lymph nodes. Surgical clips in the retroperitoneum. Reproductive: Status post hysterectomy. No adnexal masses. Other: No free air or free fluid in the abdomen. Abdominal wall musculature appears intact. Musculoskeletal: Degenerative changes in the spine. No  destructive bone lesions. IMPRESSION: 1. Right anterior branch portal vein thrombosis with zone of low-attenuation in the anterior right lobe extending to the dome, likely representing hepatic infarct. This is new since prior study. 2. New development of masslike low-attenuation and infiltration around the head of the pancreas and extending to the celiac axis surrounding the hepatic artery. 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 Follow-up after resolution of acute process is recommended to exclude underlying neoplasm. 3. Cholelithiasis. Stone or sludge suggested in the distal common bile duct. No bile duct dilatation. 4. Emphysematous changes and scarring in the lung bases with mild bronchiectasis. 5. Bilateral hydronephrosis and hydroureter to the level of the bladder. No stone or obstructing lesion demonstrated. This may indicate reflux or stasis. 6. Diffuse bladder wall thickening may indicate bladder outlet obstruction or cystitis. Correlate with urinalysis. Electronically Signed   By: Burman Nieves M.D.   On: 07/14/2023 02:37   US ABDOMEN LIMITED RUQ (LIVER/GB)  Result Date: 07/14/2023 CLINICAL DATA:  Elevated liver function studies. Elevated lipase. Abdominal pain. EXAM: ULTRASOUND ABDOMEN LIMITED RIGHT UPPER QUADRANT COMPARISON:  CT 06/20/2023 FINDINGS: Gallbladder: Contracted gallbladder with stones and sludge. No wall thickening or edema. Murphy's sign is negative. Common bile duct: Diameter: 6 mm, normal Liver: No focal lesion identified. Within normal limits in parenchymal echogenicity. A posterior right portal venous branch demonstrates wall thickening with absent flow on color flow Doppler imaging, suggesting thrombosis. This was not present on the previous study. Portal veins in this area appear patent on the CT. Main portal vein is patent with flow in the appropriate direction. Other:  None. IMPRESSION: 1. Cholelithiasis without evidence of acute cholecystitis. 2. Possible thrombosis of a right portal venous branch. This was not present on previous CT and could be artifact.  The main portal vein is patent with flow in the appropriate direction. Electronically Signed   By: Burman Nieves M.D.   On: 07/14/2023 01:14   CUP PACEART REMOTE DEVICE CHECK  Result Date: 06/27/2023 Scheduled remote reviewed. Normal device function.  Next remote 91 days. LA, CVRS     Subjective: Patient seen and the bedside today.  Hemodynamically stable.  Comfortable.  Tolerating solid diet.  Medically stable for discharge.  I discussed the discharge planning with the daughter at bedside  Discharge Exam: Vitals:   07/21/23 0426 07/21/23 0812  BP: (!) 113/50 (!) 119/54  Pulse: 73 67  Resp: 18 16  Temp: 98.3 F (36.8 C) 97.9 F (36.6 C)  SpO2: 95% 94%   Vitals:   07/20/23 1609 07/20/23 1946 07/21/23 0426 07/21/23 0812  BP: 100/65 (!) 99/42 (!) 113/50 (!) 119/54  Pulse: 74 76 73 67  Resp: 16 16 18 16   Temp: 97.8 F (36.6 C) 97.6 F (36.4 C) 98.3 F (36.8 C) 97.9 F (36.6 C)  TempSrc: Oral   Oral  SpO2: 96% 96% 95% 94%  Weight:        General: Pt is alert, awake, not in acute distress Cardiovascular: RRR, S1/S2 +, no rubs, no gallops Respiratory: CTA bilaterally, no wheezing, no rhonchi Abdominal: Soft, NT, ND, bowel sounds +, right upper quadrant drain Extremities: no edema, no cyanosis    The results of significant diagnostics from this hospitalization (including imaging, microbiology, ancillary and laboratory) are listed below for reference.     Microbiology: Recent Results (from the past 240 hour(s))  Urine Culture     Status: None   Collection Time: 07/13/23 12:32 PM   Specimen: Urine  Result Value Ref Range Status   MICRO NUMBER: [id number]  Final   SPECIMEN QUALITY: Adequate  Final   Sample Source URINE  Final   STATUS: FINAL  Final   Result:   Final    Mixed  genital flora isolated. These superficial bacteria are not indicative of a urinary tract infection. No further organism identification is warranted on this specimen. If clinically indicated, recollect clean-catch, mid-stream urine and transfer  immediately to Urine Culture Transport Tube.   Urine Culture (for pregnant, neutropenic or urologic patients or patients with an indwelling urinary catheter)     Status: None   Collection Time: 07/17/23 12:10 PM   Specimen: Urine, Clean Catch  Result Value Ref Range Status   Specimen Description URINE, CLEAN CATCH  Final   Special Requests NONE  Final   Culture   Final    NO GROWTH Performed at [facility], [address] [place], [place] [address]    Report Status 07/19/2023 FINAL  Final     Labs: BNP (last 3 results) No results for input(s): "BNP" in the last 8760 hours. Basic Metabolic Panel: Recent Labs  Lab 07/16/23 0458 07/17/23 0825 07/19/23 0704 07/20/23 0819 07/21/23 0918  NA 136 136 134* 137 138  K 5.3* 4.4 4.2 5.0 4.0  CL 105 102 105 104 109  CO2 22 21* 21* 20* 21*  GLUCOSE 93 82 76 114* 109*  BUN 21 17 11 17 13   CREATININE 1.19* 1.09* 0.97 1.40* 0.99  CALCIUM 7.8* 8.1* 7.9* 8.0* 7.9*  MG  --   --  1.7  --   --    Liver Function Tests: Recent Labs  Lab 07/16/23 0458 07/17/23 0825 07/19/23 0704 07/20/23 0819 07/21/23 0918  AST 26 21 15  87* 34  ALT  16 13 8  51* 31  ALKPHOS 116 107 78 335* 245*  BILITOT 0.9 0.9 0.7 0.8 0.5  PROT 6.8 6.8 6.0* 6.1* 5.8*  ALBUMIN 2.6* 2.4* 2.1* 2.2* 2.2*   Recent Labs  Lab 07/17/23 0825  LIPASE 63*   No results for input(s): "AMMONIA" in the last 168 hours. CBC: Recent Labs  Lab 07/16/23 0458 07/17/23 0825 07/18/23 0858 07/19/23 0704 07/20/23 0819  WBC 12.7* 7.8 6.5 6.6 5.9  HGB 10.6* 10.5* 9.2* 9.2* 9.7*  HCT 33.7* 33.9* 28.3* 29.3* 30.6*  MCV 92.3 91.6 91.0 91.6 90.5  PLT 256 232 192 202 215   Cardiac Enzymes: No results for input(s): "CKTOTAL", "CKMB",  "CKMBINDEX", "TROPONINI" in the last 168 hours. BNP: Invalid input(s): "POCBNP" CBG: Recent Labs  Lab 07/20/23 0814 07/20/23 1158 07/20/23 1706 07/20/23 1948 07/21/23 0813  GLUCAP 102* 92 118* 156* 87   D-Dimer No results for input(s): "DDIMER" in the last 72 hours. Hgb A1c No results for input(s): "HGBA1C" in the last 72 hours. Lipid Profile No results for input(s): "CHOL", "HDL", "LDLCALC", "TRIG", "CHOLHDL", "LDLDIRECT" in the last 72 hours. Thyroid function studies No results for input(s): "TSH", "T4TOTAL", "T3FREE", "THYROIDAB" in the last 72 hours.  Invalid input(s): "FREET3" Anemia work up No results for input(s): "VITAMINB12", "FOLATE", "FERRITIN", "TIBC", "IRON", "RETICCTPCT" in the last 72 hours. Urinalysis    Component Value Date/Time   COLORURINE YELLOW 07/17/2023 1210   APPEARANCEUR HAZY (A) 07/17/2023 1210   LABSPEC 1.015 07/17/2023 1210   PHURINE 5.0 07/17/2023 1210   GLUCOSEU 150 (A) 07/17/2023 1210   HGBUR SMALL (A) 07/17/2023 1210   BILIRUBINUR NEGATIVE 07/17/2023 1210   BILIRUBINUR negative 07/13/2023 1208   KETONESUR 20 (A) 07/17/2023 1210   PROTEINUR NEGATIVE 07/17/2023 1210   UROBILINOGEN 0.2 07/13/2023 1208   NITRITE NEGATIVE 07/17/2023 1210   LEUKOCYTESUR SMALL (A) 07/17/2023 1210   Sepsis Labs Recent Labs  Lab 07/17/23 0825 07/18/23 0858 07/19/23 0704 07/20/23 0819  WBC 7.8 6.5 6.6 5.9   Microbiology Recent Results (from the past 240 hour(s))  Urine Culture     Status: None   Collection Time: 07/13/23 12:32 PM   Specimen: Urine  Result Value Ref Range Status   MICRO NUMBER: [id number]  Final   SPECIMEN QUALITY: Adequate  Final   Sample Source URINE  Final   STATUS: FINAL  Final   Result:   Final    Mixed genital flora isolated. These superficial bacteria are not indicative of a urinary tract infection. No further organism identification is warranted on this specimen. If clinically indicated, recollect clean-catch, mid-stream urine  and transfer  immediately to Urine Culture Transport Tube.   Urine Culture (for pregnant, neutropenic or urologic patients or patients with an indwelling urinary catheter)     Status: None   Collection Time: 07/17/23 12:10 PM   Specimen: Urine, Clean Catch  Result Value Ref Range Status   Specimen Description URINE, CLEAN CATCH  Final   Special Requests NONE  Final   Culture   Final    NO GROWTH Performed at [facility], [address], [place], [place] [address]    Report Status 07/19/2023 FINAL  Final    Please note: You were cared for by a hospitalist during your hospital stay. Once you are discharged, your primary care physician will handle any further medical issues. Please note that NO REFILLS for any discharge medications will be authorized once you are discharged, as it is imperative that you return to  your primary care physician (or establish a relationship with a primary care physician if you do not have one) for your post hospital discharge needs so that they can reassess your need for medications and monitor your lab values.    Time coordinating discharge: 40 minutes  SIGNED:   Burnadette Pop, MD  [facility] 07/21/2023, 11:10 AM Pager [phone]  If 7PM-7AM, please contact night-coverage [email or web address] Password [email or web address]

## 2023-07-21 NOTE — Plan of Care (Signed)

## 2023-07-21 NOTE — Progress Notes (Signed)
PHARMACY - ANTICOAGULATION CONSULT NOTE  Pharmacy Consult for heparin > apixaban  Indication:  portal vein thrombosis   No Known Allergies  Patient Measurements: Weight: 52.8 kg (116 lb 6.5 oz) Heparin Dosing Weight: 52.8 kg   Vital Signs: Temp: 97.9 F (36.6 C) (10/31 0812) Temp Source: Oral (10/31 0812) BP: 119/54 (10/31 0812) Pulse Rate: 67 (10/31 0812)  Labs: Recent Labs    07/18/23 0858 07/18/23 0859 07/19/23 0704 07/20/23 0043 07/20/23 0819  HGB 9.2*  --  9.2*  --  9.7*  HCT 28.3*  --  29.3*  --  30.6*  PLT 192  --  202  --  215  HEPARINUNFRC  --  0.48 0.55 0.46  --   CREATININE  --   --  0.97  --  1.40*    Estimated Creatinine Clearance: 20.7 mL/min (A) (by C-G formula based on SCr of 1.4 mg/dL (H)).   Medical History: Past Medical History:  Diagnosis Date   Anemia    Arthritis    CAD (coronary artery disease)    a. 10/2015 PCI [facility], [place], [place]): OM (2.5x30 Resolute DES), LCX (3.5x12 Resolute DES).   Cervical cancer (HCC) 2005   Chronic systolic CHF (congestive heart failure) (HCC)    a. 01/2016 Echo: EF 20-25%, diff HK, septal-lateral dyssynchrony. Mild MR, mod to sev dil LA, nl RV, mod dil RA, mod TR, PASP .   Colon cancer (HCC) 2001   DM (diabetes mellitus) (HCC)    History of chicken pox    HLD (hyperlipidemia)    Hypertensive heart disease    Ischemic cardiomyopathy    a. 01/2016 Echo: EF 20-25%.   LBBB (left bundle branch block)      Assessment: 88 yo female presented with abdominal pain found to have portal vein thrombosis. Started on IV heparin drip at [facility] and continued upon arrival to [facility]. No anticoagulation prior to admission. Pharmacy consulted for switching heparin to apixaban.  Patient has received 7 days of therapeutic heparin so will not give apixaban load for acute VTE. Renal/age/weight adjustment is not recommended for VTE treatment.   Goal of Therapy:   Monitor platelets by anticoagulation protocol: Yes   Plan:   Stop heparin Start apixaban 5mg  BID  Monitor for signs/symptoms of bleeding   Alphia Moh, PharmD, BCPS, BCCP Clinical Pharmacist  Please check AMION for all [facility] Pharmacy phone numbers After 10:00 PM, call Main Pharmacy [phone]

## 2023-07-21 NOTE — Plan of Care (Signed)

## 2023-07-22 ENCOUNTER — Telehealth: Payer: Self-pay

## 2023-07-22 NOTE — Transitions of Care (Post Inpatient/ED Visit) (Signed)
   07/22/2023  Name: April Short MRN: [id number] DOB: May 19, 1935  Today's TOC FU Call Status: Today's TOC FU Call Status:: Unsuccessful Call (1st Attempt)  Attempted to reach the patient regarding the most recent Inpatient/ED visit.  Follow Up Plan: Additional outreach attempts will be made to reach the patient to complete the Transitions of Care (Post Inpatient/ED visit) call.   Deidre Ala, RN Medical illustrator [facility] [phone]

## 2023-07-25 ENCOUNTER — Encounter: Payer: Self-pay | Admitting: Cardiovascular Disease

## 2023-07-25 ENCOUNTER — Ambulatory Visit: Payer: 59 | Attending: Cardiovascular Disease | Admitting: Cardiovascular Disease

## 2023-07-25 VITALS — BP 116/52 | HR 70 | Ht 61.0 in | Wt 117.0 lb

## 2023-07-25 DIAGNOSIS — I5022 Chronic systolic (congestive) heart failure: Secondary | ICD-10-CM

## 2023-07-25 DIAGNOSIS — K851 Biliary acute pancreatitis without necrosis or infection: Secondary | ICD-10-CM | POA: Diagnosis not present

## 2023-07-25 MED ORDER — CARVEDILOL 25 MG PO TABS: 25.0000 mg | ORAL_TABLET | Freq: Two times a day (BID) | ORAL | 3 refills | Status: DC

## 2023-07-25 MED ORDER — DENOSUMAB 60 MG/ML ~~LOC~~ SOSY: 60.0000 mg | PREFILLED_SYRINGE | Freq: Once | SUBCUTANEOUS | Status: DC

## 2023-07-25 MED ORDER — SACUBITRIL-VALSARTAN 49-51 MG PO TABS: 1.0000 | ORAL_TABLET | Freq: Two times a day (BID) | ORAL | 11 refills | Status: DC

## 2023-07-25 NOTE — Assessment & Plan Note (Signed)
Ms. April Short  was recently hospitalized with abdominal pain and acute gallstone pancreatitis.  She was also found to have portal vein thrombosis was placed on Eliquis.  Her Entresto and carvedilol were stopped because of hypotension.  I think she can go back on her previous medications and continue her Eliquis for 6 months per Dr.Schnier.

## 2023-07-25 NOTE — Patient Instructions (Signed)
Medication Instructions:  Start taking Carvedilol 25 mg twice per day and Entresto 49/51 twice per day. New scripts sent. *If you need a refill on your cardiac medications before your next appointment, please call your pharmacy*   Follow-Up: At [facility], you and your health needs are our priority.  As part of our continuing mission to provide you with exceptional heart care, we have created designated Provider Care Teams.  These Care Teams include your primary Cardiologist (physician) and Advanced Practice Providers (APPs -  Physician Assistants and Nurse Practitioners) who all work together to provide you with the care you need, when you need it.  We recommend signing up for the patient portal called "MyChart".  Sign up information is provided on this After Visit Summary.  MyChart is used to connect with patients for Virtual Visits (Telemedicine).  Patients are able to view lab/test results, encounter notes, upcoming appointments, etc.  Non-urgent messages can be sent to your provider as well.   To learn more about what you can do with MyChart, go to [email or web address].    Your next appointment:   3 month(s)  Provider:   Any APP    Then, Nanetta Batty, MD will plan to see you again in 12 month(s).

## 2023-07-25 NOTE — Anesthesia Postprocedure Evaluation (Signed)
Anesthesia Post Note  Patient: [name]  Procedure(s) Performed: XI ROBOTIC ASSISTED LAPAROSCOPIC CHOLECYSTECTOMY WITH PRIMARY CARE OF SMALL BOWEL INDOCYANINE GREEN FLUORESCENCE IMAGING (ICG) INTRAOPERATIVE CHOLANGIOGRAM  Patient location during evaluation: PACU Anesthesia Type: General Level of consciousness: awake and alert Pain management: pain level controlled Vital Signs Assessment: post-procedure vital signs reviewed and stable Respiratory status: spontaneous breathing, nonlabored ventilation, respiratory function stable and patient connected to nasal cannula oxygen Cardiovascular status: blood pressure returned to baseline and stable Postop Assessment: no apparent nausea or vomiting Anesthetic complications: no   No notable events documented.   Last Vitals:  Vitals:   07/16/23 1707 07/16/23 1713  BP: (!) 115/47 (!) 112/45  Pulse: 72 69  Resp: 16 17  Temp: 36.9 C 36.8 C  SpO2: 91% 94%    Last Pain:  Vitals:   07/16/23 1713  TempSrc: Oral  PainSc:                  Lenard Simmer

## 2023-07-25 NOTE — Addendum Note (Signed)
Addended by: Donnamarie Poag on: 07/25/2023 02:50 PM   Modules accepted: Orders

## 2023-07-25 NOTE — Progress Notes (Signed)
07/25/2023 April Short   [birth year]  [id number]  Primary Physician Eustaquio Boyden, MD Primary Cardiologist: Runell Gess MD April Short, [place]  HPI:  April Short is a 88 y.o.  widowed Latino female accompanied by her daughters Darel Hong today.  She currently lives with. I last saw her in the office 07/04/2023. She was referred by Frazier Richards for cardiac evaluation to be established in our practice. She recently relocated from [place] to [place] to be closer to family. She does have a history of diabetes. She had 2 drug-eluting stents placed in her circumflex and obtuse marginal branch and [place] 10/31/15 with a 2.5 x 30 mm long resolute stent placement in obtuse marginal branch and a 3.5 mm x 12 mm long placed in the circumflex. She is on appropriate medicines for systolic heart failure. She recently lost a significant amount of weight secondary to diuresis. She isambulatory but really does not leave the house. She denies chest pain. She does have some dyspnea on exertion. She was on dual therapy with Brilenta, although the Brilinta has been discontinued.Marland Kitchen  She was transitioned to [place].  She had an ICD implanted in [place] in 2018.   Since I saw her in the office weeks ago she was hospitalized with abdominal pain and was found to have gallstone pancreatitis.  She underwent ERCP.  She was transferred from [facility] to [facility].  Her Entresto and carvedilol were held because of hypotension.  She was found to have portal vein thrombosis and was seen by vascular surgery who recommended 6 months of Eliquis.  She currently denies chest pain or shortness of breath.  Her major complaint is of abdominal pain.   Current Meds  Medication Sig   albuterol (PROVENTIL) (2.5 MG/3ML) 0.083% nebulizer solution Take 3 mLs (2.5 mg total) by nebulization 3 (three) times daily as needed for wheezing or shortness of breath.   apixaban (ELIQUIS) 5 MG TABS tablet Take 1 tablet  (5 mg total) by mouth 2 (two) times daily.   aspirin EC 81 MG tablet Take 81 mg by mouth daily. Swallow whole.   atorvastatin (LIPITOR) 40 MG tablet TAKE 1 TABLET BY MOUTH EVERY DAY   Cholecalciferol (VITAMIN D3) 25 MCG (1000 UT) CAPS Take 1 capsule (1,000 Units total) by mouth daily.   FARXIGA 5 MG TABS tablet TAKE 1 TABLET (5 MG TOTAL) BY MOUTH DAILY.   metoCLOPramide (REGLAN) 5 MG tablet Take 1 tablet (5 mg total) by mouth 3 (three) times daily as needed for nausea or vomiting (with meals).   pantoprazole (PROTONIX) 40 MG tablet Take 1 tablet (40 mg total) by mouth at bedtime.   PROLIA 60 MG/ML SOSY injection Inject 60 mg into the skin every 6 (six) months.   sodium chloride flush 0.9 % SOLN injection Inject 3 mLs into the vein every 8 (eight) hours.   Current Facility-Administered Medications for the 07/25/23 encounter (Office Visit) with Runell Gess, MD  Medication   [START ON 08/08/2023] denosumab (PROLIA) injection 60 mg     No Known Allergies  Social History   Socioeconomic History   Marital status: Widowed    Spouse name: Not on file   Number of children: 7   Years of education: Not on file   Highest education level: Not on file  Occupational History   Not on file  Tobacco Use   Smoking status: Never   Smokeless tobacco: Never  Substance and Sexual  Activity   Alcohol use: Not Currently    Comment: occasionally   Drug use: No   Sexual activity: Not on file  Other Topics Concern   Not on file  Social History Narrative   Widow 2018, lives with daughter   From [place], lived in [place] then [place].    Moved to [facility] 2021 to be closer to family    Occ: retired   Activity:    Diet:    Social Determinants of Corporate investment banker Strain: Low Risk  (06/20/2023)   Overall Financial Resource Strain (CARDIA)    Difficulty of Paying Living Expenses: Not hard at all  Food Insecurity: No Food Insecurity (07/14/2023)   Hunger Vital Sign    Worried About Running Out of  Food in the Last Year: Never true    Ran Out of Food in the Last Year: Never true  Transportation Needs: No Transportation Needs (07/14/2023)   PRAPARE - Administrator, Civil Service (Medical): No    Lack of Transportation (Non-Medical): No  Physical Activity: Inactive (06/20/2023)   Exercise Vital Sign    Days of Exercise per Week: 0 days    Minutes of Exercise per Session: 0 min  Stress: No Stress Concern Present (06/20/2023)   [employer] of Occupational Health - Occupational Stress Questionnaire    Feeling of Stress : Not at all  Social Connections: Socially Isolated (06/20/2023)   Social Connection and Isolation Panel [NHANES]    Frequency of Communication with Friends and Family: More than three times a week    Frequency of Social Gatherings with Friends and Family: Three times a week    Attends Religious Services: Never    Active Member of Clubs or Organizations: No    Attends Banker Meetings: Never    Marital Status: Widowed  Intimate Partner Violence: Not At Risk (07/14/2023)   Humiliation, Afraid, Rape, and Kick questionnaire    Fear of Current or Ex-Partner: No    Emotionally Abused: No    Physically Abused: No    Sexually Abused: No     Review of Systems: General: negative for chills, fever, night sweats or weight changes.  Cardiovascular: negative for chest pain, dyspnea on exertion, edema, orthopnea, palpitations, paroxysmal nocturnal dyspnea or shortness of breath Dermatological: negative for rash Respiratory: negative for cough or wheezing Urologic: negative for hematuria Abdominal: negative for nausea, vomiting, diarrhea, bright red blood per rectum, melena, or hematemesis Neurologic: negative for visual changes, syncope, or dizziness All other systems reviewed and are otherwise negative except as noted above.    Blood pressure (!) 116/52, pulse 70, height 5\' 1"  (1.549 m), weight 117 lb (53.1 kg).  General appearance: alert and  no distress Neck: no adenopathy, no carotid bruit, no JVD, supple, symmetrical, trachea midline, and thyroid not enlarged, symmetric, no tenderness/mass/nodules Lungs: clear to auscultation bilaterally Heart: regular rate and rhythm, S1, S2 normal, no murmur, click, rub or gallop Extremities: extremities normal, atraumatic, no cyanosis or edema Pulses: 2+ and symmetric Skin: Skin color, texture, turgor normal. No rashes or lesions Neurologic: Grossly normal  EKG not performed today      ASSESSMENT AND PLAN:   Acute gallstone pancreatitis Ms. Ihnen  was recently hospitalized with abdominal pain and acute gallstone pancreatitis.  She was also found to have portal vein thrombosis was placed on Eliquis.  Her Entresto and carvedilol were stopped because of hypotension.  I think she can go back on her previous medications and  continue her Eliquis for 6 months per Dr.Schnier.     Runell Gess MD FACP,FACC,FAHA, [facility] 07/25/2023 2:53 PM

## 2023-07-26 ENCOUNTER — Telehealth: Payer: Self-pay

## 2023-07-26 NOTE — Telephone Encounter (Signed)
Prolia VOB initiated via [email or web address]  Next Prolia inj DUE:  07/2023

## 2023-07-27 NOTE — Telephone Encounter (Signed)
Pt's daughter, Darel Hong, came by office stating she needed ppw comp again by Dr. Reece Agar & submitted online, if possible? Evalyn Casco stated she tried submitting ppw to employer & was told it had to be submitted online, instead, by Dr. Reece Agar. Ppw is in Dr. Timoteo Expose folder. I also highlighted the email, that ppw needs to be sent to. Call back # [phone]

## 2023-07-28 NOTE — Telephone Encounter (Signed)
Placed form in Dr. G's box.  

## 2023-08-01 NOTE — Telephone Encounter (Signed)
Pharmacy Patient Advocate Encounter   Received notification from  [employer] Portal  that prior authorization for Prolia is required/requested.   Insurance verification completed.   The patient is insured through  [employer]  .   Prior authorization has been approved from 08/01/23 to 07/31/24. Authorization number [id number] for 2 doses.

## 2023-08-02 ENCOUNTER — Other Ambulatory Visit (HOSPITAL_COMMUNITY): Payer: Self-pay | Admitting: Internal Medicine

## 2023-08-02 DIAGNOSIS — K839 Disease of biliary tract, unspecified: Secondary | ICD-10-CM

## 2023-08-02 NOTE — Telephone Encounter (Signed)
Went online to complete form - was not able to complete this online. I have filled out form and given to JoEllen.

## 2023-08-02 NOTE — Telephone Encounter (Signed)
Left message to return call to our office on daughter number provided below.

## 2023-08-03 ENCOUNTER — Ambulatory Visit (INDEPENDENT_AMBULATORY_CARE_PROVIDER_SITE_OTHER): Payer: 59 | Admitting: Family Medicine

## 2023-08-03 ENCOUNTER — Encounter: Payer: Self-pay | Admitting: Family Medicine

## 2023-08-03 VITALS — BP 112/64 | HR 56 | Wt 115.0 lb

## 2023-08-03 DIAGNOSIS — K851 Biliary acute pancreatitis without necrosis or infection: Secondary | ICD-10-CM

## 2023-08-03 DIAGNOSIS — R531 Weakness: Secondary | ICD-10-CM

## 2023-08-03 DIAGNOSIS — R630 Anorexia: Secondary | ICD-10-CM

## 2023-08-03 DIAGNOSIS — E1122 Type 2 diabetes mellitus with diabetic chronic kidney disease: Secondary | ICD-10-CM | POA: Diagnosis not present

## 2023-08-03 DIAGNOSIS — E1142 Type 2 diabetes mellitus with diabetic polyneuropathy: Secondary | ICD-10-CM | POA: Diagnosis not present

## 2023-08-03 DIAGNOSIS — Z95810 Presence of automatic (implantable) cardiac defibrillator: Secondary | ICD-10-CM | POA: Diagnosis not present

## 2023-08-03 DIAGNOSIS — I255 Ischemic cardiomyopathy: Secondary | ICD-10-CM

## 2023-08-03 DIAGNOSIS — K8050 Calculus of bile duct without cholangitis or cholecystitis without obstruction: Secondary | ICD-10-CM | POA: Diagnosis not present

## 2023-08-03 DIAGNOSIS — N183 Chronic kidney disease, stage 3 unspecified: Secondary | ICD-10-CM

## 2023-08-03 DIAGNOSIS — I81 Portal vein thrombosis: Secondary | ICD-10-CM | POA: Diagnosis not present

## 2023-08-03 DIAGNOSIS — I5032 Chronic diastolic (congestive) heart failure: Secondary | ICD-10-CM | POA: Diagnosis not present

## 2023-08-03 DIAGNOSIS — G3184 Mild cognitive impairment, so stated: Secondary | ICD-10-CM

## 2023-08-03 NOTE — Telephone Encounter (Signed)
Patient in office today given copy to patient and copy sent to scan for our chart.  No further action needed at this time.

## 2023-08-03 NOTE — Patient Instructions (Addendum)
Laboratorios hoy  Trate monitor para Archivist.  Gusto verlos hoy  Regresar en 1 mes para seguimiento. Traiga niveles de azucar para revisar.  Pedire a home health que salga a la casa para evaluacion.

## 2023-08-03 NOTE — Progress Notes (Signed)
Ph: [phone] Fax: [phone]   Patient ID: April Short, female    DOB: 1934-10-18, 88 y.o.   MRN: [id number]  This visit was conducted in person.  BP 112/64 (BP Location: Left Arm, Patient Position: Sitting, Cuff Size: Large)   Pulse (!) 56   Wt 115 lb (52.2 kg)   SpO2 98%   BMI 21.73 kg/m    CC: hosp f/u visit  Subjective:   HPI: April Short is a 88 y.o. female presenting on 08/03/2023 for Hospitalization Follow-up (Still experiencing abdominal bloating and no appetite. ) Here with daughters Darel Hong (from [place]) [place].   Recent hospitalization for abd pain found to have acute gallstone pancreatitis with choledocholithiasis as well as portal vein thrombosis s/p initial ERCP followed by rotobic assisted lap cholecystectomy by Dr Tonna Boehringer. MRCP initially unable to be performed due to pacemaker status, it was completed at [facility].  Hospital records reviewed. Med rec performed.  Eliquis 5mg  BID started, planned for 6 months per [facility].  Entresto and carvedilol were held.   Saw general surgeon Dr Tonna Boehringer last week - healing well, f/u left PRN.  Entresto and carvedilol were restarted last week.   Since home, feeling slowly better but still with epigastric abdominal discomfort. Ongoing anorexia, has had nausea with vomiting x1.  No fevers/chills, normal voiding, normal stools.  Lab Results  Component Value Date   LIPASE 106.0 (H) 08/03/2023   Family notes ongoing memory difficulty for months, but may have worsened since surgery. Both short term memory as well as cognitive decline - trouble focusing, no longer able to follow movie plots, she doesn't remember her surgery.   She is taking glucerna 1 can througout the day.   DM - continues farxiga 5mg  daily. Family don't think she would tolerate insulin injections well.  Lab Results  Component Value Date   HGBA1C 9.3 (H) 06/07/2023    Home health not set up. They request this to be set up for deconditioning /decreased stamina  and further recommendations for personal care services.  Other follow up appointments scheduled: GI 08/2023 and 09/2023  ______________________________________________________________________ Hospital admission: 07/13/2023 Hospital discharge: 07/21/2023 TCM f/u phone call: not performed   Discharge Diagnoses:  Principal Problem:   Choledocholithiasis Active Problems:   Portal vein thrombosis   Cholelithiasis   Type 2 diabetes mellitus with peripheral neuropathy (HCC)   Ischemic cardiomyopathy   CAD S/P percutaneous coronary angioplasty   ICD (implantable cardioverter-defibrillator) in place   MCI (mild cognitive impairment) with memory loss   CKD stage 3 due to type 2 diabetes mellitus (HCC)   Anemia in chronic kidney disease (CKD)   Acute gallstone pancreatitis   S/P ERCP     Relevant past medical, surgical, family and social history reviewed and updated as indicated. Interim medical history since our last visit reviewed. Allergies and medications reviewed and updated. Outpatient Medications Prior to Visit  Medication Sig Dispense Refill   albuterol (PROVENTIL) (2.5 MG/3ML) 0.083% nebulizer solution Take 3 mLs (2.5 mg total) by nebulization 3 (three) times daily as needed for wheezing or shortness of breath. 75 mL 1   apixaban (ELIQUIS) 5 MG TABS tablet Take 1 tablet (5 mg total) by mouth 2 (two) times daily. 60 tablet 0   aspirin EC 81 MG tablet Take 81 mg by mouth daily. Swallow whole.     atorvastatin (LIPITOR) 40 MG tablet TAKE 1 TABLET BY MOUTH EVERY DAY 90 tablet 2   carvedilol (COREG) 25 MG tablet Take 1  tablet (25 mg total) by mouth 2 (two) times daily. 180 tablet 3   Cholecalciferol (VITAMIN D3) 25 MCG (1000 UT) CAPS Take 1 capsule (1,000 Units total) by mouth daily. 30 capsule    FARXIGA 5 MG TABS tablet TAKE 1 TABLET (5 MG TOTAL) BY MOUTH DAILY. 30 tablet 6   pantoprazole (PROTONIX) 40 MG tablet Take 1 tablet (40 mg total) by mouth at bedtime. 30 tablet 0   PROLIA 60  MG/ML SOSY injection Inject 60 mg into the skin every 6 (six) months.     sacubitril-valsartan (ENTRESTO) 49-51 MG Take 1 tablet by mouth 2 (two) times daily. 60 tablet 11   sodium chloride flush 0.9 % SOLN injection Inject 3 mLs into the vein every 8 (eight) hours. 5 mL 15   metoCLOPramide (REGLAN) 5 MG tablet Take 1 tablet (5 mg total) by mouth 3 (three) times daily as needed for nausea or vomiting (with meals). (Patient not taking: Reported on 08/03/2023) 30 tablet 0   Facility-Administered Medications Prior to Visit  Medication Dose Route Frequency Provider Last Rate Last Admin   [START ON 08/08/2023] denosumab (PROLIA) injection 60 mg  60 mg Subcutaneous Once Eustaquio Boyden, MD         Per HPI unless specifically indicated in ROS section below Review of Systems  Objective:  BP 112/64 (BP Location: Left Arm, Patient Position: Sitting, Cuff Size: Large)   Pulse (!) 56   Wt 115 lb (52.2 kg)   SpO2 98%   BMI 21.73 kg/m   Wt Readings from Last 3 Encounters:  08/03/23 115 lb (52.2 kg)  07/25/23 117 lb (53.1 kg)  07/19/23 116 lb 6.5 oz (52.8 kg)      Physical Exam Vitals and nursing note reviewed.  Constitutional:      Appearance: Normal appearance. She is not ill-appearing.  HENT:     Head: Normocephalic and atraumatic.     Mouth/Throat:     Mouth: Mucous membranes are dry.     Pharynx: Oropharynx is clear. No oropharyngeal exudate or posterior oropharyngeal erythema.  Eyes:     Extraocular Movements: Extraocular movements intact.     Pupils: Pupils are equal, round, and reactive to light.  Cardiovascular:     Rate and Rhythm: Normal rate and regular rhythm.     Pulses: Normal pulses.     Heart sounds: Normal heart sounds.  Pulmonary:     Effort: Pulmonary effort is normal. No respiratory distress.     Breath sounds: Normal breath sounds. No wheezing, rhonchi or rales.  Abdominal:     General: Abdomen is flat. A surgical scar is present. Bowel sounds are normal. There  is no distension.     Palpations: Abdomen is soft. There is no hepatomegaly, splenomegaly or mass.     Tenderness: There is generalized abdominal tenderness (mild). There is no guarding or rebound. Negative signs include Murphy's sign.     Hernia: No hernia is present.  Musculoskeletal:     Right lower leg: No edema.     Left lower leg: No edema.  Skin:    General: Skin is warm and dry.     Coloration: Skin is not jaundiced.     Findings: No rash.  Neurological:     Mental Status: She is alert.  Psychiatric:        Mood and Affect: Mood normal.        Behavior: Behavior normal.       Results for orders placed or performed  in visit on 08/03/23  CBC with Differential/Platelet  Result Value Ref Range   WBC 6.4 4.0 - 10.5 K/uL   RBC 3.80 (L) 3.87 - 5.11 Mil/uL   Hemoglobin 11.3 (L) 12.0 - 15.0 g/dL   HCT 40.9 (L) 81.1 - 91.4 %   MCV 90.9 78.0 - 100.0 fl   MCHC 32.8 30.0 - 36.0 g/dL   RDW 78.2 (H) 95.6 - 21.3 %   Platelets 275.0 150.0 - 400.0 K/uL   Neutrophils Relative % 59.1 43.0 - 77.0 %   Lymphocytes Relative 24.7 12.0 - 46.0 %   Monocytes Relative 8.8 3.0 - 12.0 %   Eosinophils Relative 6.4 (H) 0.0 - 5.0 %   Basophils Relative 1.0 0.0 - 3.0 %   Neutro Abs 3.8 1.4 - 7.7 K/uL   Lymphs Abs 1.6 0.7 - 4.0 K/uL   Monocytes Absolute 0.6 0.1 - 1.0 K/uL   Eosinophils Absolute 0.4 0.0 - 0.7 K/uL   Basophils Absolute 0.1 0.0 - 0.1 K/uL  Comprehensive metabolic panel  Result Value Ref Range   Sodium 136 135 - 145 mEq/L   Potassium 4.8 3.5 - 5.1 mEq/L   Chloride 102 96 - 112 mEq/L   CO2 30 19 - 32 mEq/L   Glucose, Bld 188 (H) 70 - 99 mg/dL   BUN 13 6 - 23 mg/dL   Creatinine, Ser 0.86 0.40 - 1.20 mg/dL   Total Bilirubin 0.5 0.2 - 1.2 mg/dL   Alkaline Phosphatase 100 39 - 117 U/L   AST 15 0 - 37 U/L   ALT 7 0 - 35 U/L   Total Protein 7.0 6.0 - 8.3 g/dL   Albumin 3.4 (L) 3.5 - 5.2 g/dL   GFR 57.84 (L) >69.62 mL/min   Calcium 9.1 8.4 - 10.5 mg/dL  Lipase  Result Value Ref  Range   Lipase 106.0 (H) 11.0 - 59.0 U/L    Assessment & Plan:   Problem List Items Addressed This Visit     MCI (mild cognitive impairment) with memory loss (Chronic)    Ongoing difficulty, possibly worse after hospitalization/surgery.  We've previously avoided donepezil due to cardiac history.  Looks like sertraline 25mg  was stopped during hospitalization.       Relevant Orders   Ambulatory referral to Home Health   Type 2 diabetes mellitus with peripheral neuropathy (HCC)    Chronic, deteriorated, only on Farxiga 5mg  daily.  H/o metformin intolerance (GI upset)  Family doesn't think she would tolerate daily injections, nor checking daily cbg fingersticks.  They request trial of CGM - Freestyle Libre 3 sensor sample provided today. I will send in [facility] 3 plus sensors for them to price out, but discussed likely not covered by insurance as she's not on insulin.  I did ask her to return in 1 month for close f/u to review sugar log and further discuss options for glycemic control.       Relevant Orders   Ambulatory referral to Home Health   Ischemic cardiomyopathy    She is back on entresto lipitor and carvedilol.       Relevant Orders   Ambulatory referral to Home Health   Heart failure with recovered ejection fraction (HFrecEF) (HCC)    Chronic.  EF increased from 25% to 55% on latest echo 08/2022.  Continues seeing cardiology.       Relevant Orders   Ambulatory referral to Home Health   ICD (implantable cardioverter-defibrillator) in place   Relevant Orders   Ambulatory  referral to Home Health   CKD stage 3 due to type 2 diabetes mellitus (HCC)    Update renal panel after recent hospitalization.       Relevant Orders   Ambulatory referral to Home Health   Portal vein thrombosis    Due to gallstone pancreatitis.  Started on eliquis 06/2023, with plan to continue for 6 months per [facility] recs.       Relevant Orders   Ambulatory referral to Home Health    Choledocholithiasis    S/p ERCP followed by lap chole (06/2023)      Relevant Orders   CBC with Differential/Platelet (Completed)   Comprehensive metabolic panel (Completed)   Lipase (Completed)   Ambulatory referral to Home Health   Acute gallstone pancreatitis - Primary    S/p ERCP followed by lap chole for acute gallstone pancreatitis due to choledocholithiasis.  Seems to be recovering well, has been released from gen surg care.  Discussed slowly advancing diet as tolerated. Update labs today       Relevant Orders   CBC with Differential/Platelet (Completed)   Comprehensive metabolic panel (Completed)   Lipase (Completed)   Ambulatory referral to Home Health   General weakness   Other Visit Diagnoses     Anorexia       Relevant Orders   Ambulatory referral to Home Health        Meds ordered this encounter  Medications   Continuous Glucose Sensor (FREESTYLE LIBRE 3 PLUS SENSOR) MISC    Sig: Change sensor every 15 days.    Dispense:  2 each    Refill:  6    Orders Placed This Encounter  Procedures   CBC with Differential/Platelet   Comprehensive metabolic panel   Lipase   Ambulatory referral to Home Health    Referral Priority:   Routine    Referral Type:   Home Health Care    Referral Reason:   Specialty Services Required    Requested Specialty:   Home Health Services    Number of Visits Requested:   1    Patient Instructions  Lindaann Slough  Trate monitor para chequear azucar.  Gusto verlos hoy  Regresar en 1 mes para seguimiento. Traiga niveles de azucar para revisar.  Pedire a home health que salga a la casa para evaluacion.   Follow up plan: No follow-ups on file.  Eustaquio Boyden, MD

## 2023-08-04 LAB — COMPREHENSIVE METABOLIC PANEL
ALT: 7 U/L (ref 0–35)
AST: 15 U/L (ref 0–37)
Albumin: 3.4 g/dL — ABNORMAL LOW (ref 3.5–5.2)
Alkaline Phosphatase: 100 U/L (ref 39–117)
BUN: 13 mg/dL (ref 6–23)
CO2: 30 meq/L (ref 19–32)
Calcium: 9.1 mg/dL (ref 8.4–10.5)
Chloride: 102 meq/L (ref 96–112)
Creatinine, Ser: 0.95 mg/dL (ref 0.40–1.20)
GFR: 54.15 mL/min — ABNORMAL LOW (ref >60.00)
Glucose, Bld: 188 mg/dL — ABNORMAL HIGH (ref 70–99)
Potassium: 4.8 meq/L (ref 3.5–5.1)
Sodium: 136 meq/L (ref 135–145)
Total Bilirubin: 0.5 mg/dL (ref 0.2–1.2)
Total Protein: 7 g/dL (ref 6.0–8.3)

## 2023-08-04 LAB — CBC WITH DIFFERENTIAL/PLATELET
Basophils Absolute: 0.1 10*3/uL (ref 0.0–0.1)
Basophils Relative: 1 % (ref 0.0–3.0)
Eosinophils Absolute: 0.4 10*3/uL (ref 0.0–0.7)
Eosinophils Relative: 6.4 % — ABNORMAL HIGH (ref 0.0–5.0)
HCT: 34.5 % — ABNORMAL LOW (ref 36.0–46.0)
Hemoglobin: 11.3 g/dL — ABNORMAL LOW (ref 12.0–15.0)
Lymphocytes Relative: 24.7 % (ref 12.0–46.0)
Lymphs Abs: 1.6 10*3/uL (ref 0.7–4.0)
MCHC: 32.8 g/dL (ref 30.0–36.0)
MCV: 90.9 fL (ref 78.0–100.0)
Monocytes Absolute: 0.6 10*3/uL (ref 0.1–1.0)
Monocytes Relative: 8.8 % (ref 3.0–12.0)
Neutro Abs: 3.8 10*3/uL (ref 1.4–7.7)
Neutrophils Relative %: 59.1 % (ref 43.0–77.0)
Platelets: 275 10*3/uL (ref 150.0–400.0)
RBC: 3.8 Mil/uL — ABNORMAL LOW (ref 3.87–5.11)
RDW: 16.5 % — ABNORMAL HIGH (ref 11.5–15.5)
WBC: 6.4 10*3/uL (ref 4.0–10.5)

## 2023-08-04 LAB — LIPASE: Lipase: 106 U/L — ABNORMAL HIGH (ref 11.0–59.0)

## 2023-08-06 DIAGNOSIS — R531 Weakness: Secondary | ICD-10-CM | POA: Insufficient documentation

## 2023-08-06 MED ORDER — FREESTYLE LIBRE 3 PLUS SENSOR MISC: 6 refills | Status: DC

## 2023-08-06 NOTE — Assessment & Plan Note (Addendum)
S/p ERCP followed by lap chole (06/2023)

## 2023-08-06 NOTE — Assessment & Plan Note (Addendum)
Chronic.  EF increased from 25% to 55% on latest echo 08/2022.  Continues seeing cardiology.

## 2023-08-06 NOTE — Assessment & Plan Note (Signed)
Update renal panel after recent hospitalization.

## 2023-08-06 NOTE — Assessment & Plan Note (Addendum)
S/p ERCP followed by lap chole for acute gallstone pancreatitis due to choledocholithiasis.  Seems to be recovering well, has been released from gen surg care.  Discussed slowly advancing diet as tolerated. Update labs today

## 2023-08-06 NOTE — Assessment & Plan Note (Signed)
Due to gallstone pancreatitis.  Started on eliquis 06/2023, with plan to continue for 6 months per [facility] recs.

## 2023-08-06 NOTE — Assessment & Plan Note (Addendum)
Chronic, deteriorated, only on Farxiga 5mg  daily.  H/o metformin intolerance (GI upset)  Family doesn't think she would tolerate daily injections, nor checking daily cbg fingersticks.  They request trial of CGM - Freestyle Libre 3 sensor sample provided today. I will send in [facility] 3 plus sensors for them to price out, but discussed likely not covered by insurance as she's not on insulin.  I did ask her to return in 1 month for close f/u to review sugar log and further discuss options for glycemic control.

## 2023-08-06 NOTE — Assessment & Plan Note (Signed)
She is back on entresto lipitor and carvedilol.

## 2023-08-06 NOTE — Assessment & Plan Note (Addendum)
Ongoing difficulty, possibly worse after hospitalization/surgery.  We've previously avoided donepezil due to cardiac history.  Looks like sertraline 25mg  was stopped during hospitalization.

## 2023-08-14 ENCOUNTER — Other Ambulatory Visit: Payer: Self-pay | Admitting: Family Medicine

## 2023-08-14 DIAGNOSIS — E1169 Type 2 diabetes mellitus with other specified complication: Secondary | ICD-10-CM

## 2023-08-24 ENCOUNTER — Telehealth: Payer: Self-pay | Admitting: Family Medicine

## 2023-08-24 NOTE — Telephone Encounter (Signed)
Patient dropped off document  FLI , to be filled out by provider. Patient requested to send it back via Fax within ASAP. Document is located in providers tray at front office.Please advise at [facility] [phone]. Patient's daughter asked for a call back when this is completed, says that the request is urgent and that this form as supposed to be due on 12/1. Asked if this could be completed as quickly as possible. Advised that I would send back as high priority but could not guarantee completion date.

## 2023-08-24 NOTE — Telephone Encounter (Signed)
Placed form in Dr. G's box.  

## 2023-08-24 NOTE — Telephone Encounter (Signed)
Faxed form to [facility] [employer] & Work[employer], Division of Temporary Disability Ins at [phone], per Eugenia Pancoast (pt's daughter).   Spoke with "Darel Hong" notifying her the form was faxed and the original is ready to pick up for her records. She verbalizes understanding and expresses her thanks.   [Placed original form at front office- under pt's name, April Short. Made copy to scan.]

## 2023-08-24 NOTE — Telephone Encounter (Signed)
Signed and returned to [place]'

## 2023-08-27 DIAGNOSIS — E1122 Type 2 diabetes mellitus with diabetic chronic kidney disease: Secondary | ICD-10-CM | POA: Diagnosis not present

## 2023-08-27 DIAGNOSIS — Z556 Problems related to health literacy: Secondary | ICD-10-CM | POA: Diagnosis not present

## 2023-08-27 DIAGNOSIS — Z7982 Long term (current) use of aspirin: Secondary | ICD-10-CM | POA: Diagnosis not present

## 2023-08-27 DIAGNOSIS — D631 Anemia in chronic kidney disease: Secondary | ICD-10-CM | POA: Diagnosis not present

## 2023-08-27 DIAGNOSIS — Z604 Social exclusion and rejection: Secondary | ICD-10-CM | POA: Diagnosis not present

## 2023-08-27 DIAGNOSIS — I81 Portal vein thrombosis: Secondary | ICD-10-CM | POA: Diagnosis not present

## 2023-08-27 DIAGNOSIS — R413 Other amnesia: Secondary | ICD-10-CM | POA: Diagnosis not present

## 2023-08-27 DIAGNOSIS — I5020 Unspecified systolic (congestive) heart failure: Secondary | ICD-10-CM | POA: Diagnosis not present

## 2023-08-27 DIAGNOSIS — Z9181 History of falling: Secondary | ICD-10-CM | POA: Diagnosis not present

## 2023-08-27 DIAGNOSIS — I255 Ischemic cardiomyopathy: Secondary | ICD-10-CM | POA: Diagnosis not present

## 2023-08-27 DIAGNOSIS — E1142 Type 2 diabetes mellitus with diabetic polyneuropathy: Secondary | ICD-10-CM | POA: Diagnosis not present

## 2023-08-27 DIAGNOSIS — N183 Chronic kidney disease, stage 3 unspecified: Secondary | ICD-10-CM | POA: Diagnosis not present

## 2023-08-27 DIAGNOSIS — Z9049 Acquired absence of other specified parts of digestive tract: Secondary | ICD-10-CM | POA: Diagnosis not present

## 2023-08-27 DIAGNOSIS — Z7984 Long term (current) use of oral hypoglycemic drugs: Secondary | ICD-10-CM | POA: Diagnosis not present

## 2023-08-27 DIAGNOSIS — G3184 Mild cognitive impairment, so stated: Secondary | ICD-10-CM | POA: Diagnosis not present

## 2023-08-27 DIAGNOSIS — E1165 Type 2 diabetes mellitus with hyperglycemia: Secondary | ICD-10-CM | POA: Diagnosis not present

## 2023-08-27 DIAGNOSIS — Z7901 Long term (current) use of anticoagulants: Secondary | ICD-10-CM | POA: Diagnosis not present

## 2023-08-27 DIAGNOSIS — Z95810 Presence of automatic (implantable) cardiac defibrillator: Secondary | ICD-10-CM | POA: Diagnosis not present

## 2023-08-30 DIAGNOSIS — Z7984 Long term (current) use of oral hypoglycemic drugs: Secondary | ICD-10-CM | POA: Diagnosis not present

## 2023-08-30 DIAGNOSIS — Z556 Problems related to health literacy: Secondary | ICD-10-CM | POA: Diagnosis not present

## 2023-08-30 DIAGNOSIS — Z7901 Long term (current) use of anticoagulants: Secondary | ICD-10-CM | POA: Diagnosis not present

## 2023-08-30 DIAGNOSIS — Z9181 History of falling: Secondary | ICD-10-CM | POA: Diagnosis not present

## 2023-08-30 DIAGNOSIS — I5020 Unspecified systolic (congestive) heart failure: Secondary | ICD-10-CM | POA: Diagnosis not present

## 2023-08-30 DIAGNOSIS — Z7982 Long term (current) use of aspirin: Secondary | ICD-10-CM | POA: Diagnosis not present

## 2023-08-30 DIAGNOSIS — R413 Other amnesia: Secondary | ICD-10-CM | POA: Diagnosis not present

## 2023-08-30 DIAGNOSIS — E1142 Type 2 diabetes mellitus with diabetic polyneuropathy: Secondary | ICD-10-CM | POA: Diagnosis not present

## 2023-08-30 DIAGNOSIS — E1122 Type 2 diabetes mellitus with diabetic chronic kidney disease: Secondary | ICD-10-CM | POA: Diagnosis not present

## 2023-08-30 DIAGNOSIS — Z9049 Acquired absence of other specified parts of digestive tract: Secondary | ICD-10-CM | POA: Diagnosis not present

## 2023-08-30 DIAGNOSIS — I81 Portal vein thrombosis: Secondary | ICD-10-CM | POA: Diagnosis not present

## 2023-08-30 DIAGNOSIS — E1165 Type 2 diabetes mellitus with hyperglycemia: Secondary | ICD-10-CM | POA: Diagnosis not present

## 2023-08-30 DIAGNOSIS — D631 Anemia in chronic kidney disease: Secondary | ICD-10-CM | POA: Diagnosis not present

## 2023-08-30 DIAGNOSIS — G3184 Mild cognitive impairment, so stated: Secondary | ICD-10-CM | POA: Diagnosis not present

## 2023-08-30 DIAGNOSIS — Z95810 Presence of automatic (implantable) cardiac defibrillator: Secondary | ICD-10-CM | POA: Diagnosis not present

## 2023-08-30 DIAGNOSIS — N183 Chronic kidney disease, stage 3 unspecified: Secondary | ICD-10-CM | POA: Diagnosis not present

## 2023-08-30 DIAGNOSIS — I255 Ischemic cardiomyopathy: Secondary | ICD-10-CM | POA: Diagnosis not present

## 2023-08-30 DIAGNOSIS — Z604 Social exclusion and rejection: Secondary | ICD-10-CM | POA: Diagnosis not present

## 2023-08-31 ENCOUNTER — Telehealth: Payer: Self-pay

## 2023-08-31 ENCOUNTER — Other Ambulatory Visit (HOSPITAL_COMMUNITY): Payer: Self-pay

## 2023-08-31 DIAGNOSIS — D631 Anemia in chronic kidney disease: Secondary | ICD-10-CM | POA: Diagnosis not present

## 2023-08-31 DIAGNOSIS — E1142 Type 2 diabetes mellitus with diabetic polyneuropathy: Secondary | ICD-10-CM | POA: Diagnosis not present

## 2023-08-31 DIAGNOSIS — Z9049 Acquired absence of other specified parts of digestive tract: Secondary | ICD-10-CM | POA: Diagnosis not present

## 2023-08-31 DIAGNOSIS — Z556 Problems related to health literacy: Secondary | ICD-10-CM | POA: Diagnosis not present

## 2023-08-31 DIAGNOSIS — Z95810 Presence of automatic (implantable) cardiac defibrillator: Secondary | ICD-10-CM | POA: Diagnosis not present

## 2023-08-31 DIAGNOSIS — Z604 Social exclusion and rejection: Secondary | ICD-10-CM | POA: Diagnosis not present

## 2023-08-31 DIAGNOSIS — Z7984 Long term (current) use of oral hypoglycemic drugs: Secondary | ICD-10-CM | POA: Diagnosis not present

## 2023-08-31 DIAGNOSIS — Z9181 History of falling: Secondary | ICD-10-CM | POA: Diagnosis not present

## 2023-08-31 DIAGNOSIS — Z7901 Long term (current) use of anticoagulants: Secondary | ICD-10-CM | POA: Diagnosis not present

## 2023-08-31 DIAGNOSIS — I255 Ischemic cardiomyopathy: Secondary | ICD-10-CM | POA: Diagnosis not present

## 2023-08-31 DIAGNOSIS — E1122 Type 2 diabetes mellitus with diabetic chronic kidney disease: Secondary | ICD-10-CM | POA: Diagnosis not present

## 2023-08-31 DIAGNOSIS — N183 Chronic kidney disease, stage 3 unspecified: Secondary | ICD-10-CM | POA: Diagnosis not present

## 2023-08-31 DIAGNOSIS — E1165 Type 2 diabetes mellitus with hyperglycemia: Secondary | ICD-10-CM | POA: Diagnosis not present

## 2023-08-31 DIAGNOSIS — G3184 Mild cognitive impairment, so stated: Secondary | ICD-10-CM | POA: Diagnosis not present

## 2023-08-31 DIAGNOSIS — R630 Anorexia: Secondary | ICD-10-CM

## 2023-08-31 DIAGNOSIS — Z7982 Long term (current) use of aspirin: Secondary | ICD-10-CM | POA: Diagnosis not present

## 2023-08-31 DIAGNOSIS — R413 Other amnesia: Secondary | ICD-10-CM | POA: Diagnosis not present

## 2023-08-31 DIAGNOSIS — I81 Portal vein thrombosis: Secondary | ICD-10-CM | POA: Diagnosis not present

## 2023-08-31 DIAGNOSIS — I5020 Unspecified systolic (congestive) heart failure: Secondary | ICD-10-CM | POA: Diagnosis not present

## 2023-08-31 NOTE — Telephone Encounter (Signed)
*  Primary  Pharmacy Patient Advocate Encounter   Received notification from CoverMyMeds that prior authorization for FreeStyle Libre 3 Plus Sensor  is required/requested.   Insurance verification completed.   The patient is insured through [facility] .   Per test claim: PA required; PA submitted to above mentioned insurance via CoverMyMeds Key/confirmation #/[facility] Status is pending

## 2023-09-01 ENCOUNTER — Ambulatory Visit: Payer: 59 | Admitting: Physician Assistant

## 2023-09-01 ENCOUNTER — Encounter: Payer: Self-pay | Admitting: Physician Assistant

## 2023-09-01 ENCOUNTER — Telehealth: Payer: Self-pay | Admitting: Family Medicine

## 2023-09-01 VITALS — BP 104/70 | HR 66 | Ht 61.0 in | Wt 119.0 lb

## 2023-09-01 DIAGNOSIS — Z950 Presence of cardiac pacemaker: Secondary | ICD-10-CM | POA: Diagnosis not present

## 2023-09-01 DIAGNOSIS — I81 Portal vein thrombosis: Secondary | ICD-10-CM | POA: Diagnosis not present

## 2023-09-01 DIAGNOSIS — R9389 Abnormal findings on diagnostic imaging of other specified body structures: Secondary | ICD-10-CM | POA: Diagnosis not present

## 2023-09-01 NOTE — Telephone Encounter (Signed)
Noted  

## 2023-09-01 NOTE — Telephone Encounter (Signed)
HH Certification and Plan of Care form filled out.  Also filled out [id number] application for PCS.  Will place in Lisa's box when I'm back in office tomorrow.

## 2023-09-01 NOTE — Progress Notes (Signed)
Subjective:    Patient ID: April Short, female    DOB: 04-10-1935, 88 y.o.   MRN: [id number]  HPI Tannis is a pleasant non-English-speaking Hispanic female, who was recently seen during hospitalization and comes in today for posthospital follow-up.  She was initially seen by Dr. Tobi Bastos at [facility], then transferred to [facility] and seen in consultation by Dr. Rhea Belton and myself.  She had undergone laparoscopic cholecystectomy on 07/15/2023 after imaging with ultrasound and CT had shown cholelithiasis, no definite cholecystitis, and possible partial portal vein thrombosis.  CT at that time showed right anterior branch portal vein thrombosis and a new masslike low-attenuation infiltration around the head of the pancreas extending to the celiac axis and around the hepatic artery suggestive of acute pancreatitis, possibly groove pancreatitis and also had question of stone obstructing the distal common bile duct. After she underwent laparoscopic cholecystectomy the IOC was consistent with choledocholithiasis.  The patient needed MRI/MRCP but this could not be done at [facility] as she has a pacemaker/ICD in transfer was placed at [facility]. She was able to have MRI/MRCP on 07/18/2023, there was some motion artifact, she was noted to have a 8 mm filling defect compatible with stone in the distal common bile duct, no intrahepatic or significant extrahepatic biliary dilation. Thrombus present in a tributary of the portal vein segment 7 no hepatic infarct, no well-defined mass to suggest hepatocellular carcinoma or cholangiocarcinoma, gallbladder absent, status post prior left hemicolectomy, there were some hazy infiltrative changes adjacent to the pancreatic head and porta hepatis possibly due to a combination of lymph nodes and local inflammation from focal pancreatitis Haver inflammatory process over malignancy.  Patient underwent ERCP with stone extraction with Dr. Leone Payor on 07/19/2023.   Noted to have a periampullary diverticulum. Patient did well postprocedure, and was allowed discharge to home.  She is on Eliquis. Other medical problems include coronary artery disease status post prior PCI, history of ischemic cardiomyopathy status post ICD and pacemaker, nonalcoholic fatty liver disease, diabetes mellitus, hyperlipidemia mild cognitive impairment and prior history of colon cancer for which she underwent left hemicolectomy. She comes in today with her daughter who she is living with.  Patient has been doing well since discharge from the hospital, she has been back to see surgery and has had her JP drain removed.  Her daughter says she is eating very well, has no complaints of abdominal pain, no nausea or vomiting.  Bowel movements have been normal.  She was discharged home with a short course of Protonix which she has finished.  Review of Systems Pertinent positive and negative review of systems were noted in the above HPI section.  All other review of systems was otherwise negative.   Outpatient Encounter Medications as of 09/01/2023  Medication Sig   albuterol (PROVENTIL) (2.5 MG/3ML) 0.083% nebulizer solution Take 3 mLs (2.5 mg total) by nebulization 3 (three) times daily as needed for wheezing or shortness of breath.   apixaban (ELIQUIS) 5 MG TABS tablet Take 1 tablet (5 mg total) by mouth 2 (two) times daily.   aspirin EC 81 MG tablet Take 81 mg by mouth daily. Swallow whole.   atorvastatin (LIPITOR) 40 MG tablet TAKE 1 TABLET BY MOUTH EVERY DAY   carvedilol (COREG) 25 MG tablet Take 1 tablet (25 mg total) by mouth 2 (two) times daily.   Cholecalciferol (VITAMIN D3) 25 MCG (1000 UT) CAPS Take 1 capsule (1,000 Units total) by mouth daily.   Continuous Glucose Sensor (  FREESTYLE LIBRE 3 PLUS SENSOR) MISC Change sensor every 15 days.   pantoprazole (PROTONIX) 40 MG tablet Take 1 tablet (40 mg total) by mouth at bedtime.   PROLIA 60 MG/ML SOSY injection Inject 60 mg into the skin  every 6 (six) months.   sacubitril-valsartan (ENTRESTO) 49-51 MG Take 1 tablet by mouth 2 (two) times daily.   FARXIGA 5 MG TABS tablet TAKE 1 TABLET (5 MG TOTAL) BY MOUTH DAILY. (Patient not taking: Reported on 09/01/2023)   Facility-Administered Encounter Medications as of 09/01/2023  Medication   denosumab (PROLIA) injection 60 mg   No Known Allergies Patient Active Problem List   Diagnosis Date Noted   General weakness 08/06/2023   Choledocholithiasis 07/16/2023   Acute gallstone pancreatitis 07/16/2023   Portal vein thrombosis 07/14/2023   NAFLD (nonalcoholic fatty liver disease) 40/98/1191   History of colon cancer 12/03/2022   History of cervical cancer 12/03/2022   History of acute hepatitis 12/03/2022   Epiretinal membrane (ERM) of both eyes 08/11/2022   Osteoporosis 06/23/2022   Urinary incontinence 05/11/2022   Reactive airway disease 12/12/2021   Medicare annual wellness visit, subsequent 06/18/2021   Health maintenance examination 06/18/2021   Advanced directives, counseling/discussion 06/18/2021   CKD stage 3 due to type 2 diabetes mellitus (HCC) 06/18/2021   Anemia in chronic kidney disease (CKD) 06/18/2021   Hav (hallux abducto valgus), unspecified laterality 12/08/2020   Adjustment disorder with depressed mood 08/05/2020   MCI (mild cognitive impairment) with memory loss 08/05/2020   ICD (implantable cardioverter-defibrillator) in place 07/11/2020   Pain due to onychomycosis of toenails of both feet 06/05/2020   LBBB (left bundle branch block) 09/03/2016   Ischemic cardiomyopathy    Hypertensive heart disease    Hyperlipidemia associated with type 2 diabetes mellitus (HCC)    Heart failure with recovered ejection fraction (HFrecEF) (HCC)    CAD S/P percutaneous coronary angioplasty    Iron deficiency anemia 01/26/2016   Type 2 diabetes mellitus with peripheral neuropathy (HCC) 01/14/2016   Peripheral neuropathy 01/14/2016   Social History   Socioeconomic  History   Marital status: Widowed    Spouse name: Not on file   Number of children: 7   Years of education: Not on file   Highest education level: Not on file  Occupational History   Not on file  Tobacco Use   Smoking status: Never   Smokeless tobacco: Never  Substance and Sexual Activity   Alcohol use: Not Currently    Comment: occasionally   Drug use: No   Sexual activity: Not on file  Other Topics Concern   Not on file  Social History Narrative   Widow 2018, lives with daughter   From [place], lived in [place] then [place].    Moved to [facility] [place] to be closer to family    Occ: retired   Activity:    Diet:    Social Drivers of Corporate investment banker Strain: Low Risk  (06/20/2023)   Overall Financial Resource Strain (CARDIA)    Difficulty of Paying Living Expenses: Not hard at all  Food Insecurity: No Food Insecurity (07/14/2023)   Hunger Vital Sign    Worried About Running Out of Food in the Last Year: Never true    Ran Out of Food in the Last Year: Never true  Transportation Needs: No Transportation Needs (07/14/2023)   PRAPARE - Administrator, Civil Service (Medical): No    Lack of Transportation (Non-Medical): No  Physical Activity:  Inactive (06/20/2023)   Exercise Vital Sign    Days of Exercise per Week: 0 days    Minutes of Exercise per Session: 0 min  Stress: No Stress Concern Present (06/20/2023)   [employer] of Occupational Health - Occupational Stress Questionnaire    Feeling of Stress : Not at all  Social Connections: Socially Isolated (06/20/2023)   Social Connection and Isolation Panel [NHANES]    Frequency of Communication with Friends and Family: More than three times a week    Frequency of Social Gatherings with Friends and Family: Three times a week    Attends Religious Services: Never    Active Member of Clubs or Organizations: No    Attends Banker Meetings: Never    Marital Status: Widowed  Intimate Partner Violence: Not  At Risk (07/14/2023)   Humiliation, Afraid, Rape, and Kick questionnaire    Fear of Current or Ex-Partner: No    Emotionally Abused: No    Physically Abused: No    Sexually Abused: No    Ms. Muston's family history includes Colon cancer in her mother; Diabetes in her sister; Stomach cancer in her son.      Objective:    Vitals:   09/01/23 1124  BP: 104/70  Pulse: 66    Physical Exam Well-developed well-nourished elderly Hispanic female in no acute distress.  Accompanied by her daughter and an interpreter  Weight, 119 BMI 22.4  HEENT; nontraumatic normocephalic, EOMI, PE R LA, sclera anicteric. Oropharynx; not examined today Neck; supple, no JVD Cardiovascular; regular rate and rhythm with S1-S2, no murmur rub or gallop Pulmonary; Clear bilaterally Abdomen; soft, nontender, nondistended, no palpable mass or hepatosplenomegaly, bowel sounds are active, healing incisional scar Rectal; not done today Skin; benign exam, no jaundice rash or appreciable lesions Extremities; no clubbing cyanosis or edema skin warm and dry Neuro/Psych; alert and oriented x4, grossly nonfocal mood and affect appropriate        Assessment & Plan:   #84 88 year old non-English-speaking Hispanic female here for posthospital follow-up after recent hospitalization late October 2024. She was initially hospitalized at [facility] with acute gallstone pancreatitis, underwent laparoscopic cholecystectomy on 07/15/2023, IOC suspicious for common bile duct stone. Unable to do MRI/MRCP at [facility] as patient has pacemaker/ICD, transferred to [facility] MRI/MRCP as above positive for 8 mm common bile duct stone. Underwent ERCP with stone extraction/Dr. Leone Payor 07/19/2023  She has been doing well since discharge from the hospital with no current complaints.  Patient was noted to have a portal vein thrombosis at the time of initial imaging on CT and then confirmed again on MRI/MRCP, currently on  Eliquis. He also had other infiltrative versus inflammatory changes around the pancreas extending to the celiac axis.  On MRI/MRCP thrombus again noted in her tributary of the portal vein, there is no well-defined mass lesion or evidence of cholangiocarcinoma to explain, however it was suggested that she may need follow-up imaging.  #3 ischemic cardiomyopathy status post ICD and pacemaker #4.  Coronary artery disease status post PCI #5.  Diabetes mellitus #6.  Hyperlipidemia #7.  Prior history of colon cancer status post left hemicolectomy #8.  Mild cognitive impairment #9 nonalcoholic fatty liver disease  Plan; discussed follow-up imaging with the patient and her daughter, and they are agreeable to follow-up MRI/MRCP.  Will plan to schedule this for early February.  This will need to be done in [place] and would schedule at [facility] given a pacemaker/ICD. We will coordinate any needed follow-up  pending results of follow-up MRI/MRCP.     Michah Minton Oswald Hillock PA-C 09/01/2023   Cc: Eustaquio Boyden, MD

## 2023-09-01 NOTE — Patient Instructions (Addendum)
You will be contacted by [facility] Sched[facility] in the next 2 days to arrange a MRI/MRCP.  The number on your caller ID will be [phone], please answer when they call.  If you have not heard from them in 2 days please call [phone] to schedule.     Due to recent changes in healthcare laws, you may see the results of your imaging and laboratory studies on MyChart before your provider has had a chance to review them.  We understand that in some cases there may be results that are confusing or concerning to you. Not all laboratory results come back in the same time frame and the provider may be waiting for multiple results in order to interpret others.  Please give [place] 48 hours in order for your provider to thoroughly review all the results before contacting the office for clarification of your results.    I appreciate the  opportunity to care for you  Thank You   Amy Myrtie Hawk

## 2023-09-02 NOTE — Telephone Encounter (Signed)
Faxed form and orders to [facility] at [phone].

## 2023-09-05 NOTE — Telephone Encounter (Signed)
Pharmacy Patient Advocate Encounter  Received notification from [facility] that Prior Authorization for  FreeStyle Libre 3 Plus Sensor  has been DENIED.  See denial reason below. No denial letter attached in CMM. Will attach denial letter to Media tab once received.   PA #/Case ID/Reference #: [id number]

## 2023-09-05 NOTE — Telephone Encounter (Signed)
Please notify daughter-  freestyle libre 3 plus was denied by insurance as pt is not taking insulin shots. They do have the option of paying out of pocket likely ~60-70$/month - if desired may send Rx for plain freestyle libre3 sensors to preferred pharmacy.

## 2023-09-06 NOTE — Telephone Encounter (Signed)
Spoke with pt's daughter, [name] (on dpr), relaying Dr Timoteo Expose message. She verbalizes understanding and will discuss with other daughter "Darel Hong" and let us know.

## 2023-09-09 DIAGNOSIS — Z9049 Acquired absence of other specified parts of digestive tract: Secondary | ICD-10-CM | POA: Diagnosis not present

## 2023-09-09 DIAGNOSIS — Z7982 Long term (current) use of aspirin: Secondary | ICD-10-CM | POA: Diagnosis not present

## 2023-09-09 DIAGNOSIS — I81 Portal vein thrombosis: Secondary | ICD-10-CM | POA: Diagnosis not present

## 2023-09-09 DIAGNOSIS — G3184 Mild cognitive impairment, so stated: Secondary | ICD-10-CM | POA: Diagnosis not present

## 2023-09-09 DIAGNOSIS — E1122 Type 2 diabetes mellitus with diabetic chronic kidney disease: Secondary | ICD-10-CM | POA: Diagnosis not present

## 2023-09-09 DIAGNOSIS — Z604 Social exclusion and rejection: Secondary | ICD-10-CM | POA: Diagnosis not present

## 2023-09-09 DIAGNOSIS — Z95810 Presence of automatic (implantable) cardiac defibrillator: Secondary | ICD-10-CM | POA: Diagnosis not present

## 2023-09-09 DIAGNOSIS — R413 Other amnesia: Secondary | ICD-10-CM | POA: Diagnosis not present

## 2023-09-09 DIAGNOSIS — D631 Anemia in chronic kidney disease: Secondary | ICD-10-CM | POA: Diagnosis not present

## 2023-09-09 DIAGNOSIS — I255 Ischemic cardiomyopathy: Secondary | ICD-10-CM | POA: Diagnosis not present

## 2023-09-09 DIAGNOSIS — E1165 Type 2 diabetes mellitus with hyperglycemia: Secondary | ICD-10-CM | POA: Diagnosis not present

## 2023-09-09 DIAGNOSIS — Z556 Problems related to health literacy: Secondary | ICD-10-CM | POA: Diagnosis not present

## 2023-09-09 DIAGNOSIS — E1142 Type 2 diabetes mellitus with diabetic polyneuropathy: Secondary | ICD-10-CM | POA: Diagnosis not present

## 2023-09-09 DIAGNOSIS — I5020 Unspecified systolic (congestive) heart failure: Secondary | ICD-10-CM | POA: Diagnosis not present

## 2023-09-09 DIAGNOSIS — Z7901 Long term (current) use of anticoagulants: Secondary | ICD-10-CM | POA: Diagnosis not present

## 2023-09-09 DIAGNOSIS — Z9181 History of falling: Secondary | ICD-10-CM | POA: Diagnosis not present

## 2023-09-09 DIAGNOSIS — N183 Chronic kidney disease, stage 3 unspecified: Secondary | ICD-10-CM | POA: Diagnosis not present

## 2023-09-09 DIAGNOSIS — Z7984 Long term (current) use of oral hypoglycemic drugs: Secondary | ICD-10-CM | POA: Diagnosis not present

## 2023-09-11 ENCOUNTER — Other Ambulatory Visit: Payer: Self-pay | Admitting: Family Medicine

## 2023-09-11 DIAGNOSIS — E1142 Type 2 diabetes mellitus with diabetic polyneuropathy: Secondary | ICD-10-CM

## 2023-09-11 NOTE — Progress Notes (Signed)
Addendum: Reviewed and agree with assessment and management plan. Kadijah Shamoon M, MD  

## 2023-09-16 ENCOUNTER — Encounter: Payer: Self-pay | Admitting: Family Medicine

## 2023-09-16 ENCOUNTER — Ambulatory Visit (INDEPENDENT_AMBULATORY_CARE_PROVIDER_SITE_OTHER): Payer: 59 | Admitting: Family Medicine

## 2023-09-16 VITALS — BP 120/64 | HR 73 | Temp 98.2°F | Ht 61.0 in | Wt 121.4 lb

## 2023-09-16 DIAGNOSIS — F4321 Adjustment disorder with depressed mood: Secondary | ICD-10-CM

## 2023-09-16 DIAGNOSIS — G6289 Other specified polyneuropathies: Secondary | ICD-10-CM

## 2023-09-16 DIAGNOSIS — E1142 Type 2 diabetes mellitus with diabetic polyneuropathy: Secondary | ICD-10-CM

## 2023-09-16 DIAGNOSIS — G3184 Mild cognitive impairment, so stated: Secondary | ICD-10-CM

## 2023-09-16 DIAGNOSIS — K851 Biliary acute pancreatitis without necrosis or infection: Secondary | ICD-10-CM

## 2023-09-16 DIAGNOSIS — R32 Unspecified urinary incontinence: Secondary | ICD-10-CM | POA: Diagnosis not present

## 2023-09-16 DIAGNOSIS — Z7984 Long term (current) use of oral hypoglycemic drugs: Secondary | ICD-10-CM

## 2023-09-16 DIAGNOSIS — I81 Portal vein thrombosis: Secondary | ICD-10-CM | POA: Diagnosis not present

## 2023-09-16 LAB — POCT GLYCOSYLATED HEMOGLOBIN (HGB A1C): Hemoglobin A1C: 7.8 % — AB (ref 4.0–5.6)

## 2023-09-16 NOTE — Progress Notes (Unsigned)
Ph: [phone] Fax: [phone]   Patient ID: April Short, female    DOB: 1935-05-21, 88 y.o.   MRN: [id number]  This visit was conducted in person.  BP 120/64   Pulse 73   Temp 98.2 F (36.8 C) (Oral)   Ht 5\' 1"  (1.549 m)   Wt 121 lb 6 oz (55.1 kg)   SpO2 95%   BMI 22.93 kg/m    CC: 1 mo DM f/u visit  Subjective:   HPI: April Short is a 88 y.o. female presenting on 09/16/2023 for Medical Management of Chronic Issues (Here for 1 mo DM f/u. Pt accompanied by daughter, Darel Hong. )   See prior note for details.  Recent hospitalization for acute gallstone pancreatitis with choledocholithiasis as well as protal vein thrombosis s/p ERCP followed by lap chole. Planned Eliquis 5mg  BID x 6 months per [facility]. Saw GI in follow up earlier in the month - planned rpt imaging with MRI/MRCP 10/2023.   She continues glucerna supplement 1 can daily.   Notes ongoing difficulty with memory.   DM - dx age 44yo. Does not regularly check sugars. Compliant with antihyperglycemic regimen which includes: farxiga 5mg  daily. H/o metformin intolerance. Likely poor insulin candidate. Denies low sugars or hypoglycemic symptoms. Denies paresthesias, blurry vision. Last diabetic eye exam DUE. Glucometer brand: doesn't have this - family doesn't think she would do fingersticks. Last foot exam: DUE. DSME: completed remotely. Did use CGM for 2 weeks - average 200s Lab Results  Component Value Date   HGBA1C 7.8 (A) 09/16/2023   Diabetic Foot Exam - Simple   No data filed    Lab Results  Component Value Date   MICROALBUR 3.6 (H) 08/09/2022         Relevant past medical, surgical, family and social history reviewed and updated as indicated. Interim medical history since our last visit reviewed. Allergies and medications reviewed and updated. Outpatient Medications Prior to Visit  Medication Sig Dispense Refill   albuterol (PROVENTIL) (2.5 MG/3ML) 0.083% nebulizer solution Take 3 mLs (2.5 mg  total) by nebulization 3 (three) times daily as needed for wheezing or shortness of breath. 75 mL 1   apixaban (ELIQUIS) 5 MG TABS tablet Take 1 tablet (5 mg total) by mouth 2 (two) times daily. 60 tablet 0   aspirin EC 81 MG tablet Take 81 mg by mouth daily. Swallow whole.     atorvastatin (LIPITOR) 40 MG tablet TAKE 1 TABLET BY MOUTH EVERY DAY 90 tablet 0   carvedilol (COREG) 25 MG tablet Take 1 tablet (25 mg total) by mouth 2 (two) times daily. 180 tablet 3   Cholecalciferol (VITAMIN D3) 25 MCG (1000 UT) CAPS Take 1 capsule (1,000 Units total) by mouth daily. 30 capsule    dapagliflozin propanediol (FARXIGA) 5 MG TABS tablet TAKE 1 TABLET (5 MG TOTAL) BY MOUTH DAILY. 90 tablet 0   PROLIA 60 MG/ML SOSY injection Inject 60 mg into the skin every 6 (six) months.     sacubitril-valsartan (ENTRESTO) 49-51 MG Take 1 tablet by mouth 2 (two) times daily. 60 tablet 11   pantoprazole (PROTONIX) 40 MG tablet Take 1 tablet (40 mg total) by mouth at bedtime. 30 tablet 0   Facility-Administered Medications Prior to Visit  Medication Dose Route Frequency Provider Last Rate Last Admin   denosumab (PROLIA) injection 60 mg  60 mg Subcutaneous Once Eustaquio Boyden, MD         Per HPI unless specifically indicated in ROS section  below Review of Systems  Objective:  BP 120/64   Pulse 73   Temp 98.2 F (36.8 C) (Oral)   Ht 5\' 1"  (1.549 m)   Wt 121 lb 6 oz (55.1 kg)   SpO2 95%   BMI 22.93 kg/m   Wt Readings from Last 3 Encounters:  09/16/23 121 lb 6 oz (55.1 kg)  09/01/23 119 lb (54 kg)  08/03/23 115 lb (52.2 kg)      Physical Exam Vitals and nursing note reviewed.  Constitutional:      Appearance: Normal appearance. She is not ill-appearing.  HENT:     Mouth/Throat:     Mouth: Mucous membranes are moist.     Pharynx: Oropharynx is clear. No oropharyngeal exudate or posterior oropharyngeal erythema.  Eyes:     Extraocular Movements: Extraocular movements intact.     Conjunctiva/sclera:  Conjunctivae normal.     Pupils: Pupils are equal, round, and reactive to light.  Cardiovascular:     Rate and Rhythm: Normal rate and regular rhythm.     Pulses: Normal pulses.     Heart sounds: Normal heart sounds. No murmur heard. Pulmonary:     Effort: Pulmonary effort is normal. No respiratory distress.     Breath sounds: Normal breath sounds. No wheezing, rhonchi or rales.  Musculoskeletal:     Cervical back: Normal range of motion and neck supple.     Right lower leg: No edema.     Left lower leg: No edema.  Skin:    General: Skin is warm and dry.     Findings: No rash.  Neurological:     Mental Status: She is alert.  Psychiatric:        Mood and Affect: Mood normal.        Behavior: Behavior normal.       Results for orders placed or performed in visit on 09/16/23  POCT glycosylated hemoglobin (Hb A1C)   Collection Time: 09/16/23 11:40 AM  Result Value Ref Range   Hemoglobin A1C 7.8 (A) 4.0 - 5.6 %   HbA1c POC (<> result, manual entry)     HbA1c, POC (prediabetic range)     HbA1c, POC (controlled diabetic range)      Assessment & Plan:   Problem List Items Addressed This Visit     Type 2 diabetes mellitus with peripheral neuropathy (HCC) - Primary   Relevant Orders   POCT glycosylated hemoglobin (Hb A1C) (Completed)     No orders of the defined types were placed in this encounter.   Orders Placed This Encounter  Procedures   POCT glycosylated hemoglobin (Hb A1C)    There are no Patient Instructions on file for this visit.  Follow up plan: No follow-ups on file.  Eustaquio Boyden, MD

## 2023-09-16 NOTE — Patient Instructions (Addendum)
April Short dosis de farxiga a 10mg  diarios (dos pastillas por la [place]) - dejeme saber como tolera dosis mas alta para mandar nueva receta.  Podemos hacer [employer].  Gusto verla hoy.  Regresar en 3 meses para proximo fisico.

## 2023-09-17 ENCOUNTER — Encounter: Payer: Self-pay | Admitting: Family Medicine

## 2023-09-17 NOTE — Assessment & Plan Note (Addendum)
Chronic, some improvement noted base on A1c own to 7.8% - goal for her given age /comorbidities is <8%, ideally <7% if able. Will trial increased farxiga dose to 10mg , update with effect.  Declines injectable therapy. Intolerant to metformin in the past, poor GLP1RA candidate due to h/o pancreatitis.  Continue glucerna supplement PRN She did use sample CGM for 2 wks with benefit - noted average glucose 200s

## 2023-09-17 NOTE — Assessment & Plan Note (Signed)
Evidence of this on foot exam. Presumed related to diabetic neuropathy.

## 2023-09-17 NOTE — Assessment & Plan Note (Signed)
Continues recovering well from this, appetite has appropriately increased.  Saw GI, note reviewed.  Reviewed reasoning behind recommended repeat MRI/MRCP 10/2023.

## 2023-09-17 NOTE — Assessment & Plan Note (Signed)
Low dose sertraline was discontinued after recent hospitalization - will remain off at this time.

## 2023-09-17 NOTE — Telephone Encounter (Signed)
Can we check on prolia injection? Pt is overdue.  Thanks

## 2023-09-17 NOTE — Assessment & Plan Note (Addendum)
Stable period on lower dose farxiga - monitor for worsening as we increase dose back to 10mg  daily

## 2023-09-17 NOTE — Assessment & Plan Note (Signed)
Suspect Alz vs vascular dementia in diabetic.  Ongoing difficulty noted by family with increased need for supervision at home. They are looking into personal care services.  We previously avoided donepezil due to cardiac history (ischemic cardiomyopathy s/p ICD)  Reviewed core strategies to support a healthy mind - nutritions balanced diet, physical activity, reading/puzzles, and social engagement.  Consider trial namenda.

## 2023-09-17 NOTE — Assessment & Plan Note (Signed)
Now on eliquis since 07/2023, with plan for 6 months of therapy.

## 2023-09-26 ENCOUNTER — Ambulatory Visit (INDEPENDENT_AMBULATORY_CARE_PROVIDER_SITE_OTHER): Payer: 59

## 2023-09-26 DIAGNOSIS — I5022 Chronic systolic (congestive) heart failure: Secondary | ICD-10-CM

## 2023-09-26 DIAGNOSIS — I255 Ischemic cardiomyopathy: Secondary | ICD-10-CM

## 2023-09-27 ENCOUNTER — Telehealth: Payer: Self-pay | Admitting: Family Medicine

## 2023-09-27 DIAGNOSIS — Z7901 Long term (current) use of anticoagulants: Secondary | ICD-10-CM | POA: Diagnosis not present

## 2023-09-27 DIAGNOSIS — N183 Chronic kidney disease, stage 3 unspecified: Secondary | ICD-10-CM | POA: Diagnosis not present

## 2023-09-27 DIAGNOSIS — E1122 Type 2 diabetes mellitus with diabetic chronic kidney disease: Secondary | ICD-10-CM | POA: Diagnosis not present

## 2023-09-27 DIAGNOSIS — Z7982 Long term (current) use of aspirin: Secondary | ICD-10-CM | POA: Diagnosis not present

## 2023-09-27 DIAGNOSIS — Z604 Social exclusion and rejection: Secondary | ICD-10-CM | POA: Diagnosis not present

## 2023-09-27 DIAGNOSIS — Z7984 Long term (current) use of oral hypoglycemic drugs: Secondary | ICD-10-CM | POA: Diagnosis not present

## 2023-09-27 DIAGNOSIS — Z9049 Acquired absence of other specified parts of digestive tract: Secondary | ICD-10-CM | POA: Diagnosis not present

## 2023-09-27 DIAGNOSIS — I81 Portal vein thrombosis: Secondary | ICD-10-CM | POA: Diagnosis not present

## 2023-09-27 DIAGNOSIS — G3184 Mild cognitive impairment, so stated: Secondary | ICD-10-CM | POA: Diagnosis not present

## 2023-09-27 DIAGNOSIS — I255 Ischemic cardiomyopathy: Secondary | ICD-10-CM | POA: Diagnosis not present

## 2023-09-27 DIAGNOSIS — E1165 Type 2 diabetes mellitus with hyperglycemia: Secondary | ICD-10-CM | POA: Diagnosis not present

## 2023-09-27 DIAGNOSIS — E1142 Type 2 diabetes mellitus with diabetic polyneuropathy: Secondary | ICD-10-CM | POA: Diagnosis not present

## 2023-09-27 DIAGNOSIS — Z95810 Presence of automatic (implantable) cardiac defibrillator: Secondary | ICD-10-CM | POA: Diagnosis not present

## 2023-09-27 DIAGNOSIS — R413 Other amnesia: Secondary | ICD-10-CM | POA: Diagnosis not present

## 2023-09-27 DIAGNOSIS — Z9181 History of falling: Secondary | ICD-10-CM | POA: Diagnosis not present

## 2023-09-27 DIAGNOSIS — D631 Anemia in chronic kidney disease: Secondary | ICD-10-CM | POA: Diagnosis not present

## 2023-09-27 DIAGNOSIS — Z556 Problems related to health literacy: Secondary | ICD-10-CM | POA: Diagnosis not present

## 2023-09-27 DIAGNOSIS — I5020 Unspecified systolic (congestive) heart failure: Secondary | ICD-10-CM | POA: Diagnosis not present

## 2023-09-27 LAB — CUP PACEART REMOTE DEVICE CHECK
Battery Remaining Longevity: 28 mo
Battery Remaining Percentage: 32 %
Battery Voltage: 2.9 V
Brady Statistic AP VP Percent: 6.7 %
Brady Statistic AP VS Percent: 1 %
Brady Statistic AS VP Percent: 90 %
Brady Statistic AS VS Percent: 2.6 %
Brady Statistic RA Percent Paced: 6.5 %
Date Time Interrogation Session: 20250106040016
HighPow Impedance: 75 Ohm
HighPow Impedance: 75 Ohm
Implantable Lead Connection Status: 753985
Implantable Lead Connection Status: 753985
Implantable Lead Connection Status: 753985
Implantable Lead Implant Date: 20180906
Implantable Lead Implant Date: 20180906
Implantable Lead Implant Date: 20180906
Implantable Lead Location: 753858
Implantable Lead Location: 753859
Implantable Lead Location: 753860
Implantable Pulse Generator Implant Date: 20180906
Lead Channel Impedance Value: 1525 Ohm
Lead Channel Impedance Value: 430 Ohm
Lead Channel Impedance Value: 480 Ohm
Lead Channel Pacing Threshold Amplitude: 0.75 V
Lead Channel Pacing Threshold Amplitude: 0.75 V
Lead Channel Pacing Threshold Amplitude: 1.25 V
Lead Channel Pacing Threshold Pulse Width: 0.5 ms
Lead Channel Pacing Threshold Pulse Width: 0.5 ms
Lead Channel Pacing Threshold Pulse Width: 0.5 ms
Lead Channel Sensing Intrinsic Amplitude: 0.6 mV
Lead Channel Sensing Intrinsic Amplitude: 11.9 mV
Lead Channel Setting Pacing Amplitude: 2 V
Lead Channel Setting Pacing Amplitude: 2.5 V
Lead Channel Setting Pacing Amplitude: 2.5 V
Lead Channel Setting Pacing Pulse Width: 0.5 ms
Lead Channel Setting Pacing Pulse Width: 0.5 ms
Lead Channel Setting Sensing Sensitivity: 0.5 mV
Pulse Gen Serial Number: 9776461
Zone Setting Status: 755011

## 2023-09-27 NOTE — Telephone Encounter (Signed)
 Filled, signed and in Lisa's box.

## 2023-09-27 NOTE — Telephone Encounter (Signed)
 Placed form in Dr. Timoteo Expose box.

## 2023-09-27 NOTE — Telephone Encounter (Signed)
 Patient daughter came by and dropped off handicapped placard form. Placed in Dr. Timoteo Expose box up front. Thank you!

## 2023-09-28 NOTE — Telephone Encounter (Signed)
 Spoke with pt's daughter, [name] (on dpr), notifying her pt's form is ready to pick up.   [Placed form at front office.]

## 2023-10-03 ENCOUNTER — Ambulatory Visit (INDEPENDENT_AMBULATORY_CARE_PROVIDER_SITE_OTHER): Payer: 59 | Admitting: Podiatry

## 2023-10-03 ENCOUNTER — Ambulatory Visit: Payer: 59 | Admitting: Internal Medicine

## 2023-10-03 ENCOUNTER — Encounter: Payer: Self-pay | Admitting: Podiatry

## 2023-10-03 VITALS — Ht 61.0 in | Wt 121.4 lb

## 2023-10-03 DIAGNOSIS — M79675 Pain in left toe(s): Secondary | ICD-10-CM

## 2023-10-03 DIAGNOSIS — E1142 Type 2 diabetes mellitus with diabetic polyneuropathy: Secondary | ICD-10-CM | POA: Diagnosis not present

## 2023-10-03 DIAGNOSIS — B351 Tinea unguium: Secondary | ICD-10-CM | POA: Diagnosis not present

## 2023-10-03 DIAGNOSIS — M79674 Pain in right toe(s): Secondary | ICD-10-CM | POA: Diagnosis not present

## 2023-10-04 DIAGNOSIS — Z7984 Long term (current) use of oral hypoglycemic drugs: Secondary | ICD-10-CM | POA: Diagnosis not present

## 2023-10-04 DIAGNOSIS — Z9181 History of falling: Secondary | ICD-10-CM | POA: Diagnosis not present

## 2023-10-04 DIAGNOSIS — Z7982 Long term (current) use of aspirin: Secondary | ICD-10-CM | POA: Diagnosis not present

## 2023-10-04 DIAGNOSIS — I81 Portal vein thrombosis: Secondary | ICD-10-CM | POA: Diagnosis not present

## 2023-10-04 DIAGNOSIS — Z95810 Presence of automatic (implantable) cardiac defibrillator: Secondary | ICD-10-CM | POA: Diagnosis not present

## 2023-10-04 DIAGNOSIS — D631 Anemia in chronic kidney disease: Secondary | ICD-10-CM | POA: Diagnosis not present

## 2023-10-04 DIAGNOSIS — Z7901 Long term (current) use of anticoagulants: Secondary | ICD-10-CM | POA: Diagnosis not present

## 2023-10-04 DIAGNOSIS — Z604 Social exclusion and rejection: Secondary | ICD-10-CM | POA: Diagnosis not present

## 2023-10-04 DIAGNOSIS — E1142 Type 2 diabetes mellitus with diabetic polyneuropathy: Secondary | ICD-10-CM | POA: Diagnosis not present

## 2023-10-04 DIAGNOSIS — Z9049 Acquired absence of other specified parts of digestive tract: Secondary | ICD-10-CM | POA: Diagnosis not present

## 2023-10-04 DIAGNOSIS — I255 Ischemic cardiomyopathy: Secondary | ICD-10-CM | POA: Diagnosis not present

## 2023-10-04 DIAGNOSIS — G3184 Mild cognitive impairment, so stated: Secondary | ICD-10-CM | POA: Diagnosis not present

## 2023-10-04 DIAGNOSIS — N183 Chronic kidney disease, stage 3 unspecified: Secondary | ICD-10-CM | POA: Diagnosis not present

## 2023-10-04 DIAGNOSIS — E1165 Type 2 diabetes mellitus with hyperglycemia: Secondary | ICD-10-CM | POA: Diagnosis not present

## 2023-10-04 DIAGNOSIS — E1122 Type 2 diabetes mellitus with diabetic chronic kidney disease: Secondary | ICD-10-CM | POA: Diagnosis not present

## 2023-10-04 DIAGNOSIS — I5020 Unspecified systolic (congestive) heart failure: Secondary | ICD-10-CM | POA: Diagnosis not present

## 2023-10-04 DIAGNOSIS — Z556 Problems related to health literacy: Secondary | ICD-10-CM | POA: Diagnosis not present

## 2023-10-04 DIAGNOSIS — R413 Other amnesia: Secondary | ICD-10-CM | POA: Diagnosis not present

## 2023-10-06 ENCOUNTER — Other Ambulatory Visit: Payer: Self-pay

## 2023-10-06 DIAGNOSIS — M810 Age-related osteoporosis without current pathological fracture: Secondary | ICD-10-CM

## 2023-10-06 MED ORDER — DENOSUMAB 60 MG/ML ~~LOC~~ SOSY
60.0000 mg | PREFILLED_SYRINGE | Freq: Once | SUBCUTANEOUS | Status: AC
  Administered 2023-10-27: 60 mg via SUBCUTANEOUS

## 2023-10-06 NOTE — Telephone Encounter (Signed)
New order placed as requested.  

## 2023-10-10 ENCOUNTER — Encounter: Payer: Self-pay | Admitting: Podiatry

## 2023-10-10 NOTE — Progress Notes (Signed)
  Subjective:  Patient ID: April Short, female    DOB: 11-29-1934,  MRN: [id number]  April Short presents to clinic today for at risk foot care with history of diabetic neuropathy and painful elongated mycotic toenails 1-5 bilaterally which are tender when wearing enclosed shoe gear. Pain is relieved with periodic professional debridement. She is accompanied by her daughter on today's visit. Chief Complaint  Patient presents with   Nail Problem    Pt is here for [facility] last A1C was 7.5 PCP is April Short and LOV was December.   New problem(s): None.   PCP is April Boyden, MD.  No Known Allergies  Review of Systems: Negative except as noted in the HPI.  Objective: No changes noted in today's physical examination. There were no vitals filed for this visit. April Short is a pleasant 89 y.o. female WD, WN in NAD. AAO x 3.  Vascular Examination: CFT <3 seconds b/l. DP pulses faintly palpable b/l. PT pulses nonpalpable b/l. Digital hair absent. Skin temperature gradient warm to warm b/l. No pain with calf compression. No ischemia or gangrene. No cyanosis or clubbing noted b/l.    Neurological Examination: Protective sensation diminished with 10g monofilament b/l.  Dermatological Examination: Pedal skin warm and supple b/l. No open wounds b/l. No interdigital macerations.   Toenails 1-5 b/l thick, discolored, elongated with subungual debris and pain on dorsal palpation.  Incurvated nailplate bilateral great toes.  Nail border hypertrophy absent. There is tenderness to palpation. Sign(s) of infection: no clinical signs of infection noted on examination today.. No hyperkeratotic nor porokeratotic lesions present on today's visit.  Musculoskeletal Examination: Muscle strength 5/5 to all lower extremity muscle groups bilaterally. No pain, crepitus or joint limitation noted with ROM bilateral LE. HAV with bunion deformity noted b/l LE.  Radiographs: None  Last HgA1c:       Latest Ref Rng & Units 09/16/2023   11:40 AM 06/07/2023    9:55 AM 12/03/2022   10:17 AM  Hemoglobin A1C  Hemoglobin-A1c 4.0 - 5.6 % 7.8  9.3  8.9    Assessment/Plan: 1. Pain due to onychomycosis of toenails of both feet   2. Diabetic polyneuropathy associated with type 2 diabetes mellitus (HCC)     -Patient's family member present. All questions/concerns addressed on today's visit. -Continue foot and shoe inspections daily. Monitor blood glucose per PCP/Endocrinologist's recommendations. -Patient to continue soft, supportive shoe gear daily. -Toenails 1-5 b/l were debrided in length and girth with sterile nail nippers and dremel without iatrogenic bleeding.  -No invasive procedure(s) performed. Offending nail border debrided and curretaged bilateral great toes utilizing sterile nail nipper and currette. Border cleansed with alcohol and triple antibiotic ointment applied. No further treatment required by patient/caregiver. Call office if there are any concerns. -Patient/POA to call should there be question/concern in the interim.   Return in about 3 months (around 01/01/2024).  Freddie Breech, DPM      [place] LOCATION: [address] [place] [address]                   Office [phone]   [facility] LOCATION: [address] [place], [place] [address] Office [phone]

## 2023-10-12 DIAGNOSIS — G3184 Mild cognitive impairment, so stated: Secondary | ICD-10-CM | POA: Diagnosis not present

## 2023-10-12 DIAGNOSIS — E1122 Type 2 diabetes mellitus with diabetic chronic kidney disease: Secondary | ICD-10-CM | POA: Diagnosis not present

## 2023-10-12 DIAGNOSIS — Z604 Social exclusion and rejection: Secondary | ICD-10-CM | POA: Diagnosis not present

## 2023-10-12 DIAGNOSIS — Z9181 History of falling: Secondary | ICD-10-CM | POA: Diagnosis not present

## 2023-10-12 DIAGNOSIS — Z7901 Long term (current) use of anticoagulants: Secondary | ICD-10-CM | POA: Diagnosis not present

## 2023-10-12 DIAGNOSIS — Z556 Problems related to health literacy: Secondary | ICD-10-CM | POA: Diagnosis not present

## 2023-10-12 DIAGNOSIS — E1142 Type 2 diabetes mellitus with diabetic polyneuropathy: Secondary | ICD-10-CM | POA: Diagnosis not present

## 2023-10-12 DIAGNOSIS — Z95810 Presence of automatic (implantable) cardiac defibrillator: Secondary | ICD-10-CM | POA: Diagnosis not present

## 2023-10-12 DIAGNOSIS — E1165 Type 2 diabetes mellitus with hyperglycemia: Secondary | ICD-10-CM | POA: Diagnosis not present

## 2023-10-12 DIAGNOSIS — Z9049 Acquired absence of other specified parts of digestive tract: Secondary | ICD-10-CM | POA: Diagnosis not present

## 2023-10-12 DIAGNOSIS — Z7984 Long term (current) use of oral hypoglycemic drugs: Secondary | ICD-10-CM | POA: Diagnosis not present

## 2023-10-12 DIAGNOSIS — Z7982 Long term (current) use of aspirin: Secondary | ICD-10-CM | POA: Diagnosis not present

## 2023-10-12 DIAGNOSIS — R413 Other amnesia: Secondary | ICD-10-CM | POA: Diagnosis not present

## 2023-10-12 DIAGNOSIS — D631 Anemia in chronic kidney disease: Secondary | ICD-10-CM | POA: Diagnosis not present

## 2023-10-12 DIAGNOSIS — I5020 Unspecified systolic (congestive) heart failure: Secondary | ICD-10-CM | POA: Diagnosis not present

## 2023-10-12 DIAGNOSIS — I255 Ischemic cardiomyopathy: Secondary | ICD-10-CM | POA: Diagnosis not present

## 2023-10-12 DIAGNOSIS — N183 Chronic kidney disease, stage 3 unspecified: Secondary | ICD-10-CM | POA: Diagnosis not present

## 2023-10-12 DIAGNOSIS — I81 Portal vein thrombosis: Secondary | ICD-10-CM | POA: Diagnosis not present

## 2023-10-13 ENCOUNTER — Telehealth: Payer: Self-pay

## 2023-10-13 ENCOUNTER — Other Ambulatory Visit (HOSPITAL_COMMUNITY): Payer: Self-pay

## 2023-10-13 NOTE — Telephone Encounter (Signed)
Prolia VOB initiated via [email or web address] 

## 2023-10-18 ENCOUNTER — Other Ambulatory Visit (HOSPITAL_COMMUNITY): Payer: Self-pay

## 2023-10-18 DIAGNOSIS — E1142 Type 2 diabetes mellitus with diabetic polyneuropathy: Secondary | ICD-10-CM | POA: Diagnosis not present

## 2023-10-18 DIAGNOSIS — N183 Chronic kidney disease, stage 3 unspecified: Secondary | ICD-10-CM | POA: Diagnosis not present

## 2023-10-18 DIAGNOSIS — Z95810 Presence of automatic (implantable) cardiac defibrillator: Secondary | ICD-10-CM | POA: Diagnosis not present

## 2023-10-18 DIAGNOSIS — Z7901 Long term (current) use of anticoagulants: Secondary | ICD-10-CM | POA: Diagnosis not present

## 2023-10-18 DIAGNOSIS — Z556 Problems related to health literacy: Secondary | ICD-10-CM | POA: Diagnosis not present

## 2023-10-18 DIAGNOSIS — Z9181 History of falling: Secondary | ICD-10-CM | POA: Diagnosis not present

## 2023-10-18 DIAGNOSIS — D631 Anemia in chronic kidney disease: Secondary | ICD-10-CM | POA: Diagnosis not present

## 2023-10-18 DIAGNOSIS — I255 Ischemic cardiomyopathy: Secondary | ICD-10-CM | POA: Diagnosis not present

## 2023-10-18 DIAGNOSIS — I5020 Unspecified systolic (congestive) heart failure: Secondary | ICD-10-CM | POA: Diagnosis not present

## 2023-10-18 DIAGNOSIS — R413 Other amnesia: Secondary | ICD-10-CM | POA: Diagnosis not present

## 2023-10-18 DIAGNOSIS — Z9049 Acquired absence of other specified parts of digestive tract: Secondary | ICD-10-CM | POA: Diagnosis not present

## 2023-10-18 DIAGNOSIS — E1165 Type 2 diabetes mellitus with hyperglycemia: Secondary | ICD-10-CM | POA: Diagnosis not present

## 2023-10-18 DIAGNOSIS — E1122 Type 2 diabetes mellitus with diabetic chronic kidney disease: Secondary | ICD-10-CM | POA: Diagnosis not present

## 2023-10-18 DIAGNOSIS — G3184 Mild cognitive impairment, so stated: Secondary | ICD-10-CM | POA: Diagnosis not present

## 2023-10-18 DIAGNOSIS — I81 Portal vein thrombosis: Secondary | ICD-10-CM | POA: Diagnosis not present

## 2023-10-18 DIAGNOSIS — Z7982 Long term (current) use of aspirin: Secondary | ICD-10-CM | POA: Diagnosis not present

## 2023-10-18 DIAGNOSIS — Z7984 Long term (current) use of oral hypoglycemic drugs: Secondary | ICD-10-CM | POA: Diagnosis not present

## 2023-10-18 DIAGNOSIS — Z604 Social exclusion and rejection: Secondary | ICD-10-CM | POA: Diagnosis not present

## 2023-10-18 NOTE — Telephone Encounter (Signed)
April Short

## 2023-10-18 NOTE — Telephone Encounter (Signed)
Pt ready for scheduling for PROLIA on or after : 10/18/23  Out-of-pocket cost due at time of visit: $614  Number of injection/visits approved: 2  Primary: UHC-MEDICARE Prolia co-insurance: 20% Admin fee co-insurance: 20%  Secondary: [place] MEDICAID Prolia co-insurance: There is no coverage for Prolia through the patient's secondary plan [place] Medicaid due to an exclusion Admin fee co-insurance:   Medical Benefit Details: Date Benefits were checked: 10/18/23 Deductible: $0 Met of $257 Required/ Coinsurance: 20%/ Admin Fee: 20%  Prior Auth: APPROVED PA# [id number] Expiration Date: 08/01/23-07/31/24  # of doses approved: 2  Pharmacy benefit: Copay $0 If patient wants fill through the pharmacy benefit please send prescription to: [facility], and include estimated need by date in rx notes. Pharmacy will ship medication directly to the office.  Patient NOT eligible for Prolia Copay Card. Copay Card can make patient's cost as little as $25. Link to apply: [email or web address]://[email or web address]  ** This summary of benefits is an estimation of the patient's out-of-pocket cost. Exact cost may very based on individual plan coverage.

## 2023-10-21 ENCOUNTER — Other Ambulatory Visit: Payer: Self-pay | Admitting: Family Medicine

## 2023-10-21 ENCOUNTER — Other Ambulatory Visit: Payer: Self-pay

## 2023-10-21 ENCOUNTER — Other Ambulatory Visit (INDEPENDENT_AMBULATORY_CARE_PROVIDER_SITE_OTHER): Payer: 59

## 2023-10-21 DIAGNOSIS — F4321 Adjustment disorder with depressed mood: Secondary | ICD-10-CM

## 2023-10-21 DIAGNOSIS — M810 Age-related osteoporosis without current pathological fracture: Secondary | ICD-10-CM

## 2023-10-21 LAB — BASIC METABOLIC PANEL
BUN: 30 mg/dL — ABNORMAL HIGH (ref 6–23)
CO2: 29 meq/L (ref 19–32)
Calcium: 9.5 mg/dL (ref 8.4–10.5)
Chloride: 102 meq/L (ref 96–112)
Creatinine, Ser: 1.11 mg/dL (ref 0.40–1.20)
GFR: 44.85 mL/min — ABNORMAL LOW (ref >60.00)
Glucose, Bld: 217 mg/dL — ABNORMAL HIGH (ref 70–99)
Potassium: 4.5 meq/L (ref 3.5–5.1)
Sodium: 138 meq/L (ref 135–145)

## 2023-10-21 MED ORDER — PROLIA 60 MG/ML ~~LOC~~ SOSY: 60.0000 mg | PREFILLED_SYRINGE | SUBCUTANEOUS | 1 refills | Status: AC

## 2023-10-21 NOTE — Telephone Encounter (Signed)
Called patient reviewed all following information including appointment, Co pay due at time of visit and if pick up of injection is needed from outside pharmacy.     Out of pocket for patient: will be patient supplied with $0 due to pharmacy   Lab appointment : 10/21/23   Nurse visit:  11/01/23  Lab order placed: Yes  Prolia has been  []   Ordered  []   Script sent to local pharmacy for patient to bring   [x]   Script sent to Specialty pharmacy  [facility]   []   Script sent to [facility] to deliver

## 2023-10-24 DIAGNOSIS — Z9181 History of falling: Secondary | ICD-10-CM | POA: Diagnosis not present

## 2023-10-24 DIAGNOSIS — R413 Other amnesia: Secondary | ICD-10-CM | POA: Diagnosis not present

## 2023-10-24 DIAGNOSIS — G3184 Mild cognitive impairment, so stated: Secondary | ICD-10-CM | POA: Diagnosis not present

## 2023-10-24 DIAGNOSIS — Z556 Problems related to health literacy: Secondary | ICD-10-CM | POA: Diagnosis not present

## 2023-10-24 DIAGNOSIS — E1122 Type 2 diabetes mellitus with diabetic chronic kidney disease: Secondary | ICD-10-CM | POA: Diagnosis not present

## 2023-10-24 DIAGNOSIS — I5020 Unspecified systolic (congestive) heart failure: Secondary | ICD-10-CM | POA: Diagnosis not present

## 2023-10-24 DIAGNOSIS — Z7901 Long term (current) use of anticoagulants: Secondary | ICD-10-CM | POA: Diagnosis not present

## 2023-10-24 DIAGNOSIS — I81 Portal vein thrombosis: Secondary | ICD-10-CM | POA: Diagnosis not present

## 2023-10-24 DIAGNOSIS — E1142 Type 2 diabetes mellitus with diabetic polyneuropathy: Secondary | ICD-10-CM | POA: Diagnosis not present

## 2023-10-24 DIAGNOSIS — I255 Ischemic cardiomyopathy: Secondary | ICD-10-CM | POA: Diagnosis not present

## 2023-10-24 DIAGNOSIS — N183 Chronic kidney disease, stage 3 unspecified: Secondary | ICD-10-CM | POA: Diagnosis not present

## 2023-10-24 DIAGNOSIS — Z7982 Long term (current) use of aspirin: Secondary | ICD-10-CM | POA: Diagnosis not present

## 2023-10-24 DIAGNOSIS — Z9049 Acquired absence of other specified parts of digestive tract: Secondary | ICD-10-CM | POA: Diagnosis not present

## 2023-10-24 DIAGNOSIS — Z7984 Long term (current) use of oral hypoglycemic drugs: Secondary | ICD-10-CM | POA: Diagnosis not present

## 2023-10-24 DIAGNOSIS — Z604 Social exclusion and rejection: Secondary | ICD-10-CM | POA: Diagnosis not present

## 2023-10-24 DIAGNOSIS — E1165 Type 2 diabetes mellitus with hyperglycemia: Secondary | ICD-10-CM | POA: Diagnosis not present

## 2023-10-24 DIAGNOSIS — Z95810 Presence of automatic (implantable) cardiac defibrillator: Secondary | ICD-10-CM | POA: Diagnosis not present

## 2023-10-24 DIAGNOSIS — D631 Anemia in chronic kidney disease: Secondary | ICD-10-CM | POA: Diagnosis not present

## 2023-10-25 ENCOUNTER — Ambulatory Visit (INDEPENDENT_AMBULATORY_CARE_PROVIDER_SITE_OTHER): Payer: 59 | Admitting: Nurse Practitioner

## 2023-10-25 ENCOUNTER — Ambulatory Visit
Admission: RE | Admit: 2023-10-25 | Discharge: 2023-10-25 | Disposition: A | Discharge status: Home / Self Care | Payer: 59 | Source: Ambulatory Visit | Attending: Nurse Practitioner | Admitting: Nurse Practitioner

## 2023-10-25 ENCOUNTER — Encounter: Payer: Self-pay | Admitting: Nurse Practitioner

## 2023-10-25 VITALS — BP 118/50 | HR 71 | Wt 119.0 lb

## 2023-10-25 DIAGNOSIS — I10 Essential (primary) hypertension: Secondary | ICD-10-CM | POA: Insufficient documentation

## 2023-10-25 DIAGNOSIS — I2510 Atherosclerotic heart disease of native coronary artery without angina pectoris: Secondary | ICD-10-CM

## 2023-10-25 DIAGNOSIS — I81 Portal vein thrombosis: Secondary | ICD-10-CM

## 2023-10-25 DIAGNOSIS — I447 Left bundle-branch block, unspecified: Secondary | ICD-10-CM | POA: Diagnosis not present

## 2023-10-25 DIAGNOSIS — M79661 Pain in right lower leg: Secondary | ICD-10-CM | POA: Insufficient documentation

## 2023-10-25 DIAGNOSIS — E1142 Type 2 diabetes mellitus with diabetic polyneuropathy: Secondary | ICD-10-CM | POA: Insufficient documentation

## 2023-10-25 DIAGNOSIS — E785 Hyperlipidemia, unspecified: Secondary | ICD-10-CM

## 2023-10-25 DIAGNOSIS — Z95810 Presence of automatic (implantable) cardiac defibrillator: Secondary | ICD-10-CM | POA: Diagnosis not present

## 2023-10-25 DIAGNOSIS — I5022 Chronic systolic (congestive) heart failure: Secondary | ICD-10-CM | POA: Insufficient documentation

## 2023-10-25 DIAGNOSIS — I255 Ischemic cardiomyopathy: Secondary | ICD-10-CM | POA: Diagnosis not present

## 2023-10-25 NOTE — Progress Notes (Signed)
 Office Visit    Patient Name: April Short Date of Encounter: 10/25/2023  Primary Care Provider:  Rilla Baller, MD Primary Cardiologist:  Dorn Lesches, MD  Chief Complaint    89 year old female with a history of CAD s/p DES-LCx and OM in 2017 in [place] , chronic systolic heart failure, ICM s/p ICD, LBBB, hypertension, hyperlipidemia, type 2 diabetes, gallstone pancreatitis s/p ERCP, portal vein thrombosis on Eliquis  per vascular surgery, and arthritis who presents for follow-up related to heart failure and hypertension.  Past Medical History    Past Medical History:  Diagnosis Date   Anemia    Arthritis    CAD (coronary artery disease)    a. 10/2015 PCI [facility], [place], [place] OM (2.5x30 Resolute DES), LCX (3.5x12 Resolute DES).   Cervical cancer (HCC) 2005   Chronic systolic CHF (congestive heart failure) (HCC)    a. 01/2016 Echo: EF 20-25%, diff HK, septal-lateral dyssynchrony. Mild MR, mod to sev dil LA, nl RV, mod dil RA, mod TR, PASP .   Colon cancer (HCC) 2001   DM (diabetes mellitus) (HCC)    History of chicken pox    HLD (hyperlipidemia)    Hypertensive heart disease    Ischemic cardiomyopathy    a. 01/2016 Echo: EF 20-25%.   LBBB (left bundle branch block)    Past Surgical History:  Procedure Laterality Date   COLON RESECTION  2001   colon cancer   COLONOSCOPY  11/2011   polyp x1, int hem ([place] )   CORONARY STENT PLACEMENT     ERCP N/A 07/19/2023   Procedure: ENDOSCOPIC RETROGRADE CHOLANGIOPANCREATOGRAPHY (ERCP);  Surgeon: Avram Lupita BRAVO, MD;  Location: [facility] ENDOSCOPY;  Service: Gastroenterology;  Laterality: N/A;   INTRAOPERATIVE CHOLANGIOGRAM N/A 07/15/2023   Procedure: INTRAOPERATIVE CHOLANGIOGRAM;  Surgeon: Tye Millet, DO;  Location: [facility] ORS;  Service: General;  Laterality: N/A;   REMOVAL OF STONES  07/19/2023   Procedure: REMOVAL OF STONES;  Surgeon: Avram Lupita BRAVO, MD;  Location: [facility] ENDOSCOPY;  Service: Gastroenterology;;    ROBOTIC ASSISTED LAPAROSCOPIC CHOLECYSTECTOMY  07/15/2023   pathology - chronic cholecystitis with cholelithiasis   SPHINCTEROTOMY  07/19/2023   Procedure: SPHINCTEROTOMY;  Surgeon: Avram Lupita BRAVO, MD;  Location: [facility] ENDOSCOPY;  Service: Gastroenterology;;   TOTAL ABDOMINAL HYSTERECTOMY  2005   cervical cancer    Allergies  No Known Allergies   Labs/Other Studies Reviewed    The following studies were reviewed today:  Cardiac Studies & Procedures      ECHOCARDIOGRAM  ECHOCARDIOGRAM COMPLETE 09/06/2022  Narrative ECHOCARDIOGRAM REPORT    Patient Name:   April Short Date of Exam: 09/06/2022 Medical Rec #:  [id number]        Height:       61.0 in Accession #:    [id number]       Weight:       123.2 lb Date of Birth:  [birth year]         BSA:          1.537 m Patient Age:    87 years         BP:           112/64 mmHg Patient Gender: F                HR:           51 bpm. Exam Location:  [address]  Procedure: 2D Echo, 3D Echo, Cardiac Doppler, Color Doppler and Strain Analysis  Indications:  I25.5 Ischemic Cardiomyopathy  History:        Patient has prior history of Echocardiogram examinations, most recent 02/04/2016. CHF, CAD, Defibrillator, Arrythmias:LBBB; Risk Factors:Hypertension, Diabetes and Dyslipidemia. Ischemic Cardiomyopathy (prior EF 20-25%), History of Cervical and Colon Cancer.  Sonographer:    April Short RDCS Referring Phys: JONATHAN J BERRY  IMPRESSIONS   1. Global longitudinal strain is -20.8% (normal). Left ventricular ejection fraction, by estimation, is 50 to 55%. The left ventricle has low normal function. 2. Right ventricular systolic function is normal. The right ventricular size is normal. There is normal pulmonary artery systolic pressure. 3. Mild mitral valve regurgitation. 4. The aortic valve is tricuspid. Aortic valve regurgitation is trivial. Aortic valve sclerosis is present, with no evidence of aortic valve stenosis. 5.  The inferior vena cava is normal in size with greater than 50% respiratory variability, suggesting right atrial pressure of 3 mmHg.  FINDINGS Left Ventricle: Global longitudinal strain is -20.8% (normal). Left ventricular ejection fraction, by estimation, is 50 to 55%. The left ventricle has low normal function. The left ventricular internal cavity size was normal in size. There is no left ventricular hypertrophy.  Right Ventricle: The right ventricular size is normal. Right vetricular wall thickness was not assessed. Right ventricular systolic function is normal. There is normal pulmonary artery systolic pressure. The tricuspid regurgitant velocity is 2.44 m/s, and with an assumed right atrial pressure of 3 mmHg, the estimated right ventricular systolic pressure is 26.8 mmHg.  Left Atrium: Left atrial size was normal in size.  Right Atrium: Right atrial size was normal in size.  Pericardium: There is no evidence of pericardial effusion.  Mitral Valve: There is mild thickening of the mitral valve leaflet(s). Mild mitral valve regurgitation.  Tricuspid Valve: The tricuspid valve is normal in structure. Tricuspid valve regurgitation is mild.  Aortic Valve: The aortic valve is tricuspid. Aortic valve regurgitation is trivial. Aortic regurgitation PHT measures 373 msec. Aortic valve sclerosis is present, with no evidence of aortic valve stenosis.  Pulmonic Valve: The pulmonic valve was normal in structure. Pulmonic valve regurgitation is trivial.  Aorta: The aortic root and ascending aorta are structurally normal, with no evidence of dilitation.  Venous: The inferior vena cava is normal in size with greater than 50% respiratory variability, suggesting right atrial pressure of 3 mmHg.  IAS/Shunts: No atrial level shunt detected by color flow Doppler.  Additional Comments: A device lead is visualized.   LEFT VENTRICLE PLAX 2D LVIDd:         4.85 cm   Diastology LVIDs:         3.53 cm    LV e' medial:    4.68 cm/s LV PW:         0.80 cm   LV E/e' medial:  11.9 LV IVS:        0.85 cm   LV e' lateral:   6.42 cm/s LVOT diam:     2.20 cm   LV E/e' lateral: 8.7 LV SV:         52 LV SV Index:   34        2D Longitudinal Strain LVOT Area:     3.80 cm  2D Strain GLS (A2C):   -23.9 % 2D Strain GLS (A3C):   -20.6 % 2D Strain GLS (A4C):   -18.0 % 2D Strain GLS Avg:     -20.8 %  3D Volume EF: 3D EF:        66 % LV EDV:  130 ml LV ESV:       44 ml LV SV:        86 ml  RIGHT VENTRICLE RV Basal diam:  3.10 cm RV S prime:     11.00 cm/s TAPSE (M-mode): 1.6 cm  LEFT ATRIUM             Index        RIGHT ATRIUM           Index LA diam:        3.20 cm 2.08 cm/m   RA Area:     15.10 cm LA Vol (A2C):   41.9 ml 27.26 ml/m  RA Volume:   40.60 ml  26.41 ml/m LA Vol (A4C):   29.9 ml 19.45 ml/m LA Biplane Vol: 37.7 ml 24.52 ml/m AORTIC VALVE LVOT Vmax:   63.10 cm/s LVOT Vmean:  38.800 cm/s LVOT VTI:    0.137 m AI PHT:      373 msec  AORTA Ao Root diam: 3.00 cm Ao Asc diam:  3.00 cm  MITRAL VALVE               TRICUSPID VALVE MV Area (PHT): cm         TR Peak grad:   23.8 mmHg MV Decel Time: 279 msec    TR Vmax:        244.00 cm/s MV E velocity: 55.70 cm/s MV A velocity: 87.00 cm/s  SHUNTS MV E/A ratio:  0.64        Systemic VTI:  0.14 m Systemic Diam: 2.20 cm  Vina Gull MD Electronically signed by Vina Gull MD Signature Date/Time: 09/06/2022/4:18:56 PM    Final            Recent Labs: 07/19/2023: Magnesium 1.7 08/03/2023: ALT 7; Hemoglobin 11.3; Platelets 275.0 10/21/2023: BUN 30; Creatinine, Ser 1.11; Potassium 4.5; Sodium 138  Recent Lipid Panel    Component Value Date/Time   CHOL 160 08/09/2022 0847   CHOL 124 06/27/2020 1143   TRIG 153.0 (H) 08/09/2022 0847   HDL 58.40 08/09/2022 0847   HDL 39 (L) 06/27/2020 1143   CHOLHDL 3 08/09/2022 0847   VLDL 30.6 08/09/2022 0847   LDLCALC 71 08/09/2022 0847   LDLCALC 55 06/27/2020 1143    History  of Present Illness    89 year old female with the above past medical history including CAD s/p DES-LCx and OM in 2017 in [place] , chronic systolic heart failure, ICM s/p ICD, LBBB, hypertension, hyperlipidemia, type 2 diabetes, gallstone pancreatitis s/p ERCP, portal vein thrombosis on Eliquis  per vascular surgery, and arthritis.  She has a history of CAD with prior intervention in [place]  including DES-LCx, DES-OM in 10/2015, EF was  20-25% at the time.  She has a history of ICM s/p ICD in [place]  in 2018.  She relocated to [place]  to be closer to her family has followed with Dr. Court ever since.  Most recent echocardiogram in 08/2022 showed EF improved to 50 to 55%, low normal LV function, normal RV systolic function, mild mitral valve regurgitation.  She was hospitalized in October 2024 in the setting of gallstone pancreatitis.  She underwent ERCP.  Entresto  and carvedilol  were held in the setting of hypotension.  She was found to have portal vein thrombosis, she was evaluated by vascular surgery who recommended 6 months of Eliquis  therapy.  She was last seen in the office on 07/25/2023 and was stable from a cardiac standpoint.  Entresto  and carvedilol  were  resumed.  She presents today for follow-up accompanied by her daughter who per pt request provides in person interpretation.  Pt signed the interpreter waiver form. Since her last visit she has done well from a cardiac standpoint.  She notes that she ran out of her Eliquis  prescription in January, having completed only 3 months of therapy.  She has noted intermittent right calf pain, original redness and swelling.  She denies any chest pain, palpitations, dizziness, dyspnea, edema, PND, orthopnea, weight gain.  Her BP has been stable.  Overall, she reports feeling well.  Home Medications    Current Outpatient Medications  Medication Sig Dispense Refill   albuterol  (PROVENTIL ) (2.5 MG/3ML) 0.083% nebulizer solution Take 3 mLs (2.5 mg  total) by nebulization 3 (three) times daily as needed for wheezing or shortness of breath. 75 mL 1   aspirin EC 81 MG tablet Take 81 mg by mouth daily. Swallow whole.     atorvastatin  (LIPITOR) 40 MG tablet TAKE 1 TABLET BY MOUTH EVERY DAY 90 tablet 0   Cholecalciferol (VITAMIN D3) 25 MCG (1000 UT) CAPS Take 1 capsule (1,000 Units total) by mouth daily. 30 capsule    dapagliflozin  propanediol (FARXIGA ) 5 MG TABS tablet TAKE 1 TABLET (5 MG TOTAL) BY MOUTH DAILY. 90 tablet 0   PROLIA  60 MG/ML SOSY injection Inject 60 mg into the skin every 6 (six) months. 180 mL 1   sacubitril -valsartan  (ENTRESTO ) 49-51 MG Take 1 tablet by mouth 2 (two) times daily. 60 tablet 11   sertraline  (ZOLOFT ) 25 MG tablet TAKE 1 TABLET (25 MG TOTAL) BY MOUTH DAILY. 90 tablet 0   apixaban  (ELIQUIS ) 5 MG TABS tablet Take 1 tablet (5 mg total) by mouth 2 (two) times daily. (Patient not taking: Reported on 10/25/2023) 60 tablet 0   carvedilol  (COREG ) 25 MG tablet Take 1 tablet (25 mg total) by mouth 2 (two) times daily. 180 tablet 3   Current Facility-Administered Medications  Medication Dose Route Frequency Provider Last Rate Last Admin   denosumab  (PROLIA ) injection 60 mg  60 mg Subcutaneous Once Gutierrez, Javier, MD       denosumab  (PROLIA ) injection 60 mg  60 mg Subcutaneous Once Rilla Baller, MD         Review of Systems    She denies chest pain, palpitations, dyspnea, pnd, orthopnea, n, v, dizziness, syncope, edema, weight gain, or early satiety. All other systems reviewed and are otherwise negative except as noted above.   Physical Exam    VS:  BP (!) 118/50 (BP Location: Right Arm, Patient Position: Sitting, Cuff Size: Normal)   Pulse 71   Wt 119 lb (54 kg)   SpO2 95%   BMI 22.48 kg/m   GEN: Well nourished, well developed, in no acute distress. HEENT: normal. Neck: Supple, no JVD, carotid bruits, or masses. Cardiac: RRR, no murmurs, rubs, or gallops. No clubbing, cyanosis, edema.  Radials/DP/PT 2+ and  equal bilaterally.  Respiratory:  Respirations regular and unlabored, clear to auscultation bilaterally. GI: Soft, nontender, nondistended, BS + x 4. MS: no deformity or atrophy. Skin: warm and dry, no rash. Neuro:  Strength and sensation are intact. Psych: Normal affect.  Accessory Clinical Findings    ECG personally reviewed by me today -    - no EKG in office today.    Lab Results  Component Value Date   WBC 6.4 08/03/2023   HGB 11.3 (L) 08/03/2023   HCT 34.5 (L) 08/03/2023   MCV 90.9 08/03/2023   PLT 275.0  08/03/2023   Lab Results  Component Value Date   CREATININE 1.11 10/21/2023   BUN 30 (H) 10/21/2023   NA 138 10/21/2023   K 4.5 10/21/2023   CL 102 10/21/2023   CO2 29 10/21/2023   Lab Results  Component Value Date   ALT 7 08/03/2023   AST 15 08/03/2023   ALKPHOS 100 08/03/2023   BILITOT 0.5 08/03/2023   Lab Results  Component Value Date   CHOL 160 08/09/2022   HDL 58.40 08/09/2022   LDLCALC 71 08/09/2022   TRIG 153.0 (H) 08/09/2022   CHOLHDL 3 08/09/2022    Lab Results  Component Value Date   HGBA1C 7.8 (A) 09/16/2023    Assessment & Plan    1. CAD: S/p DES-LCx and OM in 2017 in [place] . Stable with no anginal symptoms. No indication for ischemic evaluation.  Continue aspirin, carvedilol , Entresto , Farxiga , and Lipitor.  2. Chronic systolic heart failure/ICM: S/p ICD in [place]  in 2018. Most recent echocardiogram in 08/2022 showed EF improved to 50 to 55%, low normal LV function, normal RV systolic function, mild mitral valve regurgitation.  Recent device check in 09/2023 showed normal device function, 1 episode of V. tach, atrial driven, 36 seconds in duration, 1 episode of SVT, 40 seconds in duration.  Denies any palpitations, dizziness, presyncope or syncope.  Euvolemic and well compensated on exam.  Continue current medications as above.  3. LBBB: Stable on prior EKGs.  4. Hypertension: BP well controlled. Continue current antihypertensive  regimen.   5. Hyperlipidemia: LDL was 71 in 07/2022.  Consider repeat labs at follow-up.  Continue Lipitor.  6. Type 2 diabetes: A1c was 7.8 in 08/2023.  Monitored and managed per PCP.  7. Portal vein thrombosis:  Noted during hospitalization in 07/2023 for gallstone pancreatitis.  Vascular surgery was consulted.  It was recommended she complete 6 months of Eliquis  therapy.  She ran out of her Eliquis  prescription at the end of January. She only completed 3 months of anticoagulation therapy.  Reviewed with Dr. Court, will resume Eliquis  5 mg twice daily x 3 months.  Will refer to vascular surgery per Dr. Ranee recommendation for follow-up of portal vein thrombosis.   8. Right calf pain:  She has noted right calf pain, occasional redness and swelling.  Will rule out DVT with lower extremity venous duplex.   9. Disposition: Follow-up in 6 months with Dr. Court.       Damien JAYSON Braver, NP 10/25/2023, 1:18 PM

## 2023-10-25 NOTE — Patient Instructions (Addendum)
 Medication Instructions:  Resume Eliquis  twice daily for 2 months and stop.  *If you need a refill on your cardiac medications before your next appointment, please call your pharmacy*   Lab Work: NONE ordered at this time of appointment     Testing/Procedures: Vas Lower Extremity Ultrasound   Follow-Up: At [facility], you and your health needs are our priority.  As part of our continuing mission to provide you with exceptional heart care, we have created designated Provider Care Teams.  These Care Teams include your primary Cardiologist (physician) and Advanced Practice Providers (APPs -  Physician Assistants and Nurse Practitioners) who all work together to provide you with the care you need, when you need it.  We recommend signing up for the patient portal called MyChart.  Sign up information is provided on this After Visit Summary.  MyChart is used to connect with patients for Virtual Visits (Telemedicine).  Patients are able to view lab/test results, encounter notes, upcoming appointments, etc.  Non-urgent messages can be sent to your provider as well.   To learn more about what you can do with MyChart, go to [email or web address].    Your next appointment:   6 month(s)  Provider:   Dorn Lesches, MD     Other Instructions

## 2023-10-26 ENCOUNTER — Telehealth: Payer: Self-pay | Admitting: Family Medicine

## 2023-10-26 NOTE — Telephone Encounter (Signed)
 Elana Grayer from [employer] called in and wanted to clarification on patients Prolia . He can be reached at [phone]. Thank you!

## 2023-10-26 NOTE — Telephone Encounter (Signed)
Patient is needing her prolia shot before Monday her daughter would like a call bck

## 2023-10-26 NOTE — Telephone Encounter (Signed)
 Have called insurance and verified all information. They need patient to call and give ok to send to office. I have reached out to daughter and per her request sent information via my chart. She will call now. They are aware I will call once received to set up appointment time.

## 2023-10-27 ENCOUNTER — Telehealth: Payer: Self-pay | Admitting: Family Medicine

## 2023-10-27 ENCOUNTER — Ambulatory Visit: Payer: 59

## 2023-10-27 DIAGNOSIS — M810 Age-related osteoporosis without current pathological fracture: Secondary | ICD-10-CM

## 2023-10-27 MED ORDER — DENOSUMAB 60 MG/ML ~~LOC~~ SOSY
60.0000 mg | PREFILLED_SYRINGE | Freq: Once | SUBCUTANEOUS | Status: AC
  Administered 2024-05-17: 60 mg via SUBCUTANEOUS

## 2023-10-27 NOTE — Telephone Encounter (Signed)
 Called patient daughter on dpr. Have moved appointment to today. They will be here at 2pm

## 2023-10-27 NOTE — Telephone Encounter (Signed)
 Pt's daughter called asking if there was anything her or the pt needed to do before her prolia  inj on 2/19? Please advise. Call back # [phone]

## 2023-10-27 NOTE — Progress Notes (Signed)
 Per orders of Dr. Herby Lolling, injection of Prolia   given by Eller Gut in right arm Patient tolerated injection well. Patient will be contacted when its time for next injection.

## 2023-10-27 NOTE — Telephone Encounter (Signed)
 Copied from CRM [id number]. Topic: General - Other >> Oct 27, 2023 11:47 AM Juleen Oakland F wrote: Reason for CRM: Patient daughter called, request call back when Prolia  injection has been received - an appt is already scheduled for 11/01/23

## 2023-10-28 NOTE — Telephone Encounter (Signed)
 Patient injection administered in office yesterday.

## 2023-11-01 ENCOUNTER — Ambulatory Visit: Payer: 59

## 2023-11-02 NOTE — Addendum Note (Signed)
Addended by: Geralyn Flash D on: 11/02/2023 03:03 PM   Modules accepted: Orders

## 2023-11-02 NOTE — Progress Notes (Signed)
Remote ICD transmission.

## 2023-11-13 ENCOUNTER — Other Ambulatory Visit: Payer: Self-pay | Admitting: Family Medicine

## 2023-11-13 DIAGNOSIS — E1169 Type 2 diabetes mellitus with other specified complication: Secondary | ICD-10-CM

## 2023-11-22 IMAGING — MG MM DIGITAL SCREENING BILAT W/ TOMO AND CAD
6 of 12 series · 6 of 36 positions shown · non-contrast
Comparison: None.

CLINICAL DATA: Screening.

EXAM:
DIGITAL SCREENING BILATERAL MAMMOGRAM WITH TOMOSYNTHESIS AND CAD
TECHNIQUE: Bilateral screening digital craniocaudal and mediolateral oblique
mammograms were obtained. Bilateral screening digital breast
tomosynthesis was performed. The images were evaluated with
computer-aided detection.

[L MLO synth-2D (1 of 2)]
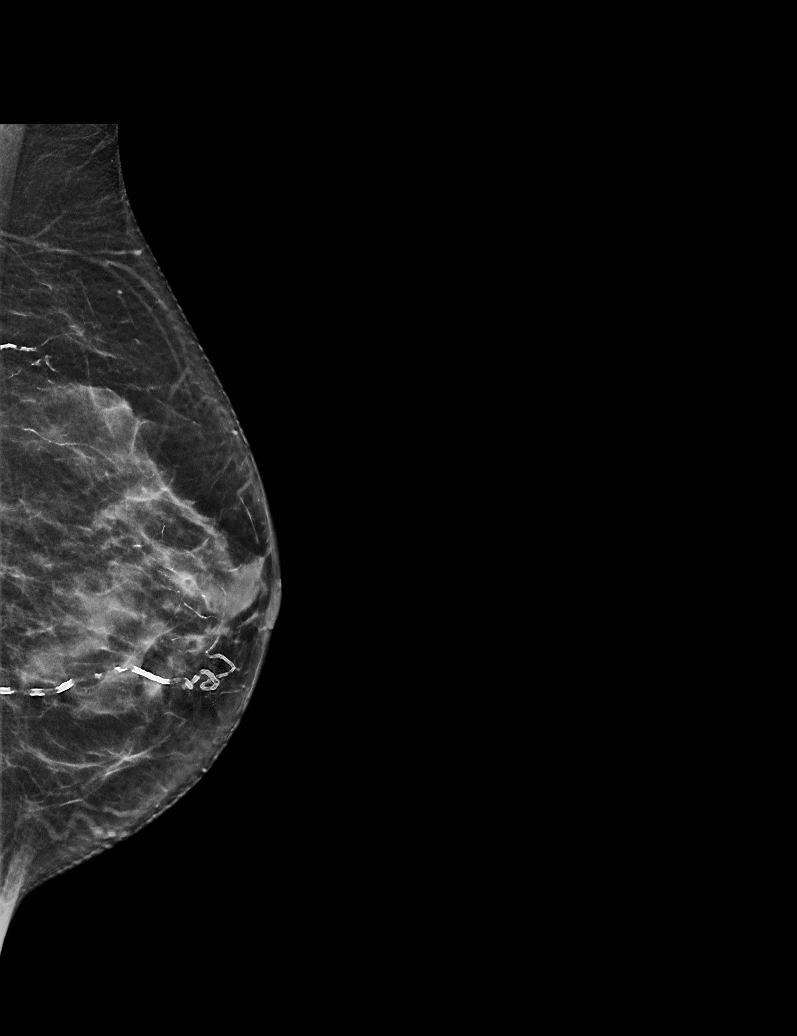

[L CC synth-2D]
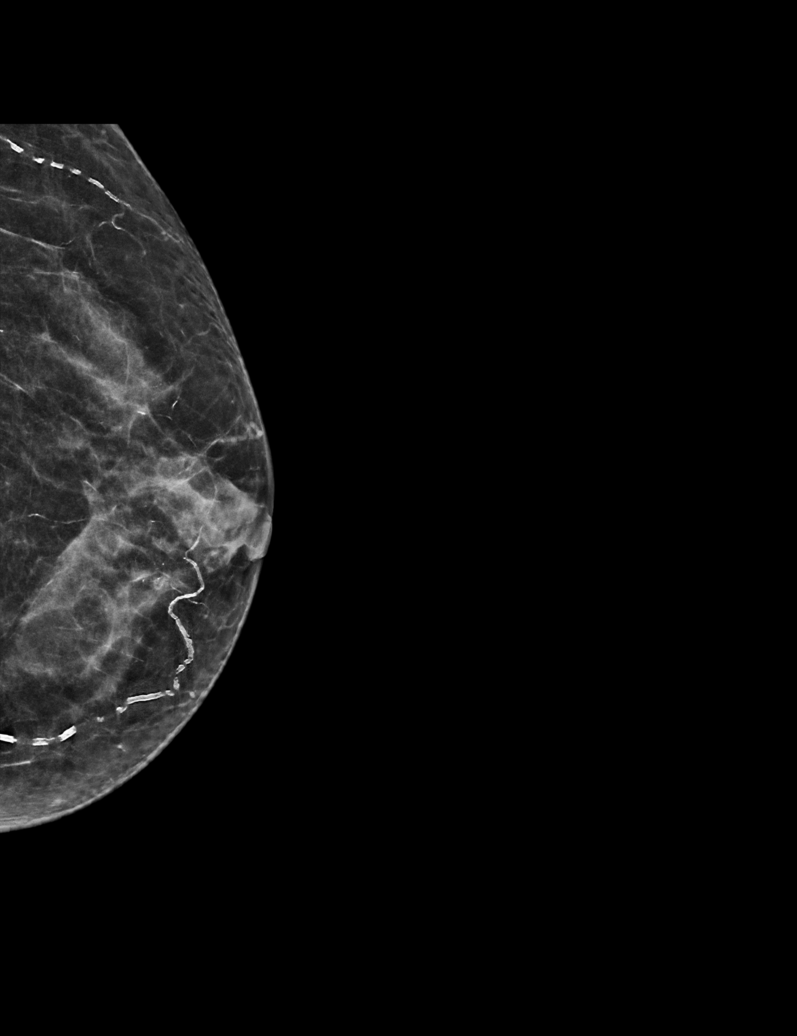

[R CC synth-2D]
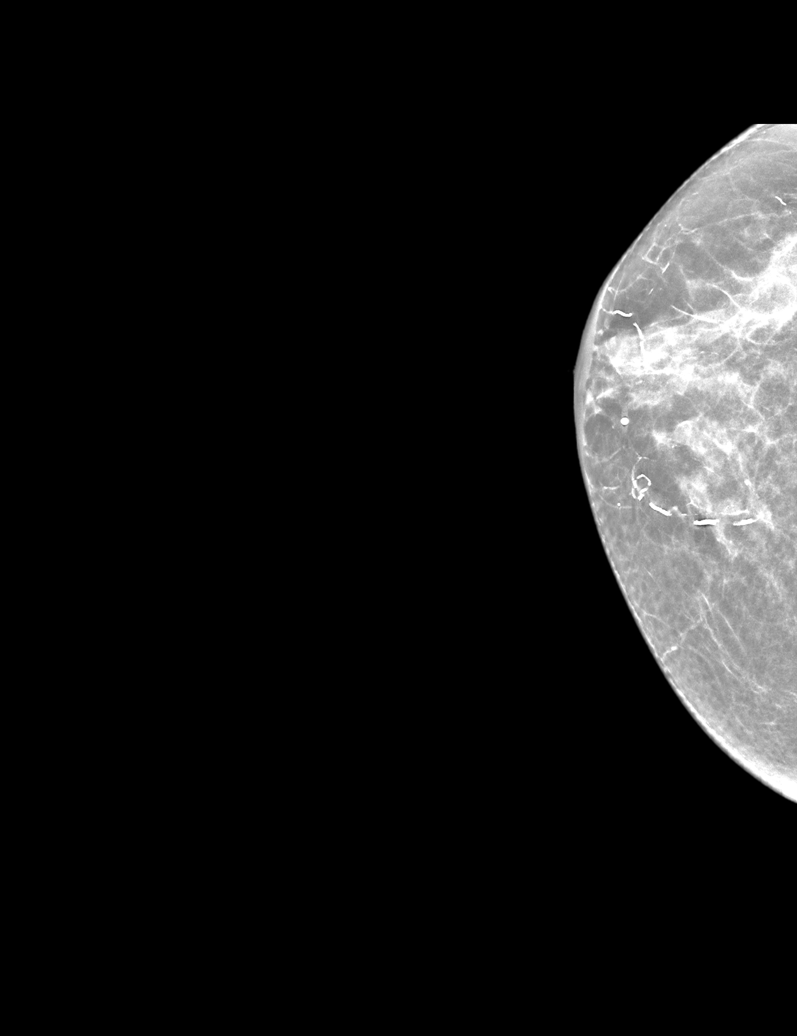

[L MLO synth-2D (2 of 2)]
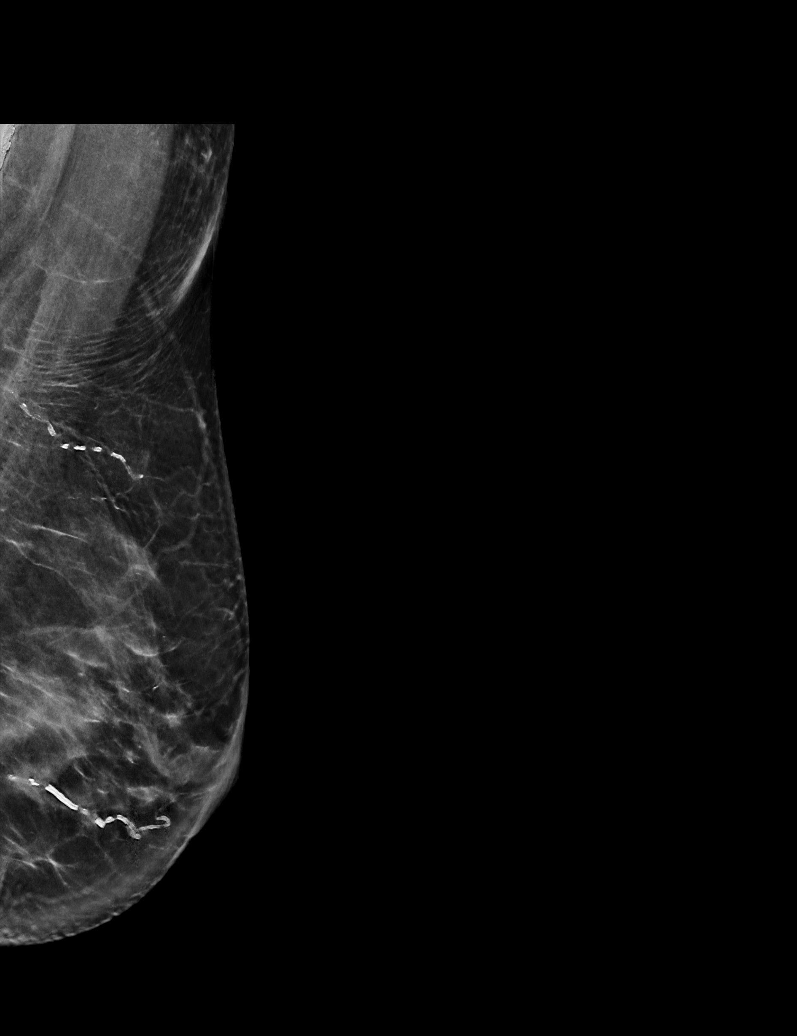

[R XCCL synth-2D]
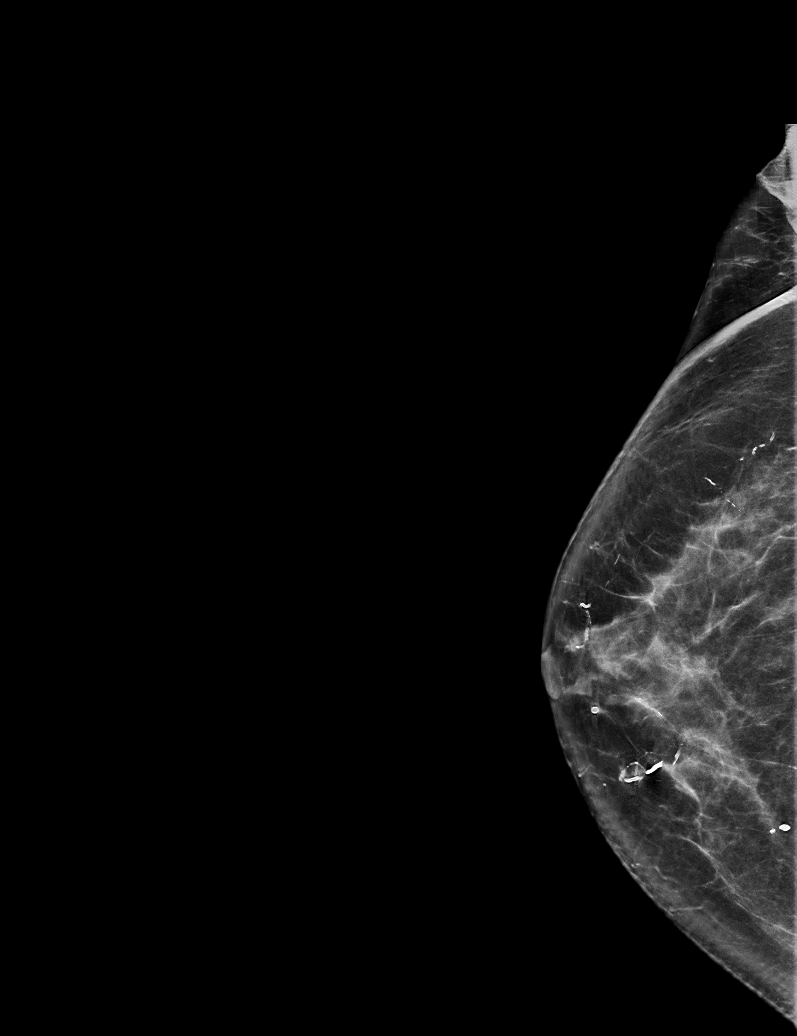

[R MLO synth-2D]
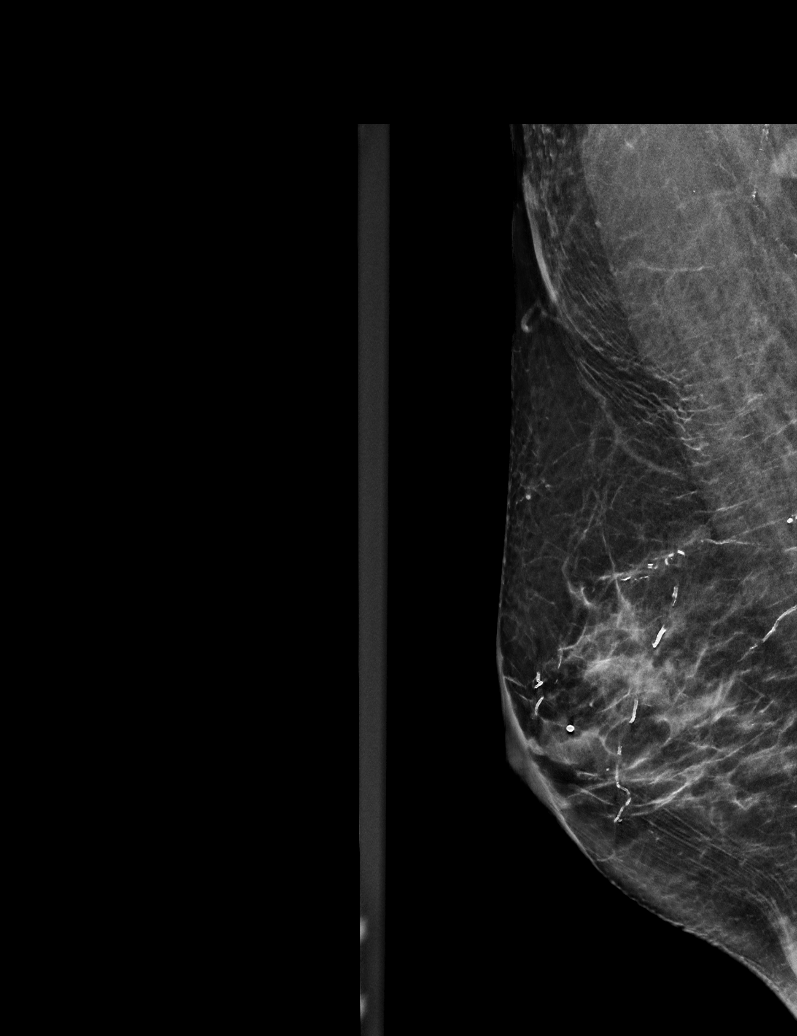

[6 of 36 positions shown; findings below may reference images not displayed]

ACR Breast Density Category c: The breast tissue is heterogeneously
dense, which may obscure small masses
FINDINGS: There are no findings suspicious for malignancy.
IMPRESSION: No mammographic evidence of malignancy. A result letter of this
screening mammogram will be mailed directly to the patient.

RECOMMENDATION:
Screening mammogram in one year. (Code:C8-T-HNK)

BI-RADS CATEGORY  1: Negative.

## 2023-11-25 ENCOUNTER — Telehealth: Payer: Self-pay

## 2023-11-25 DIAGNOSIS — E1142 Type 2 diabetes mellitus with diabetic polyneuropathy: Secondary | ICD-10-CM

## 2023-11-25 MED ORDER — DAPAGLIFLOZIN PROPANEDIOL 10 MG PO TABS: 10.0000 mg | ORAL_TABLET | Freq: Every day | ORAL | 1 refills | Status: DC

## 2023-11-25 NOTE — Addendum Note (Signed)
 Addended by: Eustaquio Boyden on: 11/25/2023 02:07 PM   Modules accepted: Orders

## 2023-11-25 NOTE — Telephone Encounter (Signed)
 Copied from CRM [id number]. Topic: Clinical - Prescription Issue >> Nov 25, 2023 11:35 AM Efraim Kaufmann C wrote: Reason for CRM: Patient's pharmacy stated she needed to talk to the doctor before they can refill dapagliflozin propanediol (FARXIGA) 5 MG TABS. Agent isn't sure if it is regarding pre-authorization issue or needing to be seen before they can refill it as they were not specific. Please advise with patient's daughter. Thank you

## 2023-11-25 NOTE — Addendum Note (Signed)
 Addended by: Donnamarie Poag on: 11/25/2023 12:13 PM   Modules accepted: Orders

## 2023-11-25 NOTE — Telephone Encounter (Signed)
 Spoke with pt/pt's daughter, "Darel Hong" relaying Dr Timoteo Expose message. Verbalizes understanding and expresses thanks.

## 2023-11-25 NOTE — Telephone Encounter (Addendum)
 Plz notify I've sent in farxiga 10mg  dose to pharmacy - ensure they tolerated higher dose (two  5mg  tablets) previously.

## 2023-11-25 NOTE — Telephone Encounter (Signed)
 Called daughter states was told that the farxiga would increase from 5mg  to 10mg . Pharmacy needs new script showing change. Ok to send as pended. Pharmacy verified with daughter on dpr.

## 2023-12-19 ENCOUNTER — Encounter: Payer: Self-pay | Admitting: Family Medicine

## 2023-12-19 ENCOUNTER — Ambulatory Visit (INDEPENDENT_AMBULATORY_CARE_PROVIDER_SITE_OTHER): Payer: 59 | Admitting: Family Medicine

## 2023-12-19 VITALS — BP 116/66 | HR 60 | Temp 98.2°F | Ht 61.5 in | Wt 119.4 lb

## 2023-12-19 DIAGNOSIS — F4321 Adjustment disorder with depressed mood: Secondary | ICD-10-CM | POA: Diagnosis not present

## 2023-12-19 DIAGNOSIS — N183 Chronic kidney disease, stage 3 unspecified: Secondary | ICD-10-CM | POA: Diagnosis not present

## 2023-12-19 DIAGNOSIS — Z0000 Encounter for general adult medical examination without abnormal findings: Secondary | ICD-10-CM | POA: Diagnosis not present

## 2023-12-19 DIAGNOSIS — I5032 Chronic diastolic (congestive) heart failure: Secondary | ICD-10-CM

## 2023-12-19 DIAGNOSIS — E785 Hyperlipidemia, unspecified: Secondary | ICD-10-CM

## 2023-12-19 DIAGNOSIS — K760 Fatty (change of) liver, not elsewhere classified: Secondary | ICD-10-CM

## 2023-12-19 DIAGNOSIS — E1169 Type 2 diabetes mellitus with other specified complication: Secondary | ICD-10-CM | POA: Diagnosis not present

## 2023-12-19 DIAGNOSIS — D509 Iron deficiency anemia, unspecified: Secondary | ICD-10-CM | POA: Diagnosis not present

## 2023-12-19 DIAGNOSIS — I255 Ischemic cardiomyopathy: Secondary | ICD-10-CM

## 2023-12-19 DIAGNOSIS — E1122 Type 2 diabetes mellitus with diabetic chronic kidney disease: Secondary | ICD-10-CM | POA: Diagnosis not present

## 2023-12-19 DIAGNOSIS — G3184 Mild cognitive impairment, so stated: Secondary | ICD-10-CM

## 2023-12-19 DIAGNOSIS — Z95810 Presence of automatic (implantable) cardiac defibrillator: Secondary | ICD-10-CM

## 2023-12-19 DIAGNOSIS — I81 Portal vein thrombosis: Secondary | ICD-10-CM

## 2023-12-19 DIAGNOSIS — D631 Anemia in chronic kidney disease: Secondary | ICD-10-CM

## 2023-12-19 DIAGNOSIS — E1142 Type 2 diabetes mellitus with diabetic polyneuropathy: Secondary | ICD-10-CM

## 2023-12-19 DIAGNOSIS — N1831 Chronic kidney disease, stage 3a: Secondary | ICD-10-CM

## 2023-12-19 DIAGNOSIS — H35373 Puckering of macula, bilateral: Secondary | ICD-10-CM

## 2023-12-19 DIAGNOSIS — M810 Age-related osteoporosis without current pathological fracture: Secondary | ICD-10-CM

## 2023-12-19 DIAGNOSIS — I2510 Atherosclerotic heart disease of native coronary artery without angina pectoris: Secondary | ICD-10-CM

## 2023-12-19 DIAGNOSIS — Z7189 Other specified counseling: Secondary | ICD-10-CM

## 2023-12-19 LAB — CBC WITH DIFFERENTIAL/PLATELET
Basophils Absolute: 0 10*3/uL (ref 0.0–0.1)
Basophils Relative: 0.6 % (ref 0.0–3.0)
Eosinophils Absolute: 0.1 10*3/uL (ref 0.0–0.7)
Eosinophils Relative: 2.3 % (ref 0.0–5.0)
HCT: 37 % (ref 36.0–46.0)
Hemoglobin: 12.1 g/dL (ref 12.0–15.0)
Lymphocytes Relative: 33.6 % (ref 12.0–46.0)
Lymphs Abs: 2 10*3/uL (ref 0.7–4.0)
MCHC: 32.7 g/dL (ref 30.0–36.0)
MCV: 87.4 fl (ref 78.0–100.0)
Monocytes Absolute: 0.4 10*3/uL (ref 0.1–1.0)
Monocytes Relative: 7.6 % (ref 3.0–12.0)
Neutro Abs: 3.3 10*3/uL (ref 1.4–7.7)
Neutrophils Relative %: 55.9 % (ref 43.0–77.0)
Platelets: 173 10*3/uL (ref 150.0–400.0)
RBC: 4.24 Mil/uL (ref 3.87–5.11)
RDW: 16 % — ABNORMAL HIGH (ref 11.5–15.5)
WBC: 5.9 10*3/uL (ref 4.0–10.5)

## 2023-12-19 LAB — HEMOGLOBIN A1C: Hgb A1c MFr Bld: 9.6 % — ABNORMAL HIGH (ref 4.6–6.5)

## 2023-12-19 LAB — COMPREHENSIVE METABOLIC PANEL WITH GFR
ALT: 15 U/L (ref 0–35)
AST: 17 U/L (ref 0–37)
Albumin: 4.2 g/dL (ref 3.5–5.2)
Alkaline Phosphatase: 77 U/L (ref 39–117)
BUN: 36 mg/dL — ABNORMAL HIGH (ref 6–23)
CO2: 27 meq/L (ref 19–32)
Calcium: 9 mg/dL (ref 8.4–10.5)
Chloride: 103 meq/L (ref 96–112)
Creatinine, Ser: 1.24 mg/dL — ABNORMAL HIGH (ref 0.40–1.20)
GFR: 39.23 mL/min — ABNORMAL LOW (ref >60.00)
Glucose, Bld: 179 mg/dL — ABNORMAL HIGH (ref 70–99)
Potassium: 5.2 meq/L — ABNORMAL HIGH (ref 3.5–5.1)
Sodium: 137 meq/L (ref 135–145)
Total Bilirubin: 0.5 mg/dL (ref 0.2–1.2)
Total Protein: 8 g/dL (ref 6.0–8.3)

## 2023-12-19 LAB — MICROALBUMIN / CREATININE URINE RATIO
Creatinine,U: 44.5 mg/dL
Microalb Creat Ratio: 66.5 mg/g — ABNORMAL HIGH (ref 0.0–30.0)
Microalb, Ur: 3 mg/dL — ABNORMAL HIGH (ref 0.0–1.9)

## 2023-12-19 LAB — FERRITIN: Ferritin: 11.1 ng/mL (ref 10.0–291.0)

## 2023-12-19 LAB — LIPID PANEL
Cholesterol: 155 mg/dL (ref 0–200)
HDL: 47.6 mg/dL (ref >39.00)
LDL Cholesterol: 73 mg/dL (ref 0–99)
NonHDL: 107.67
Total CHOL/HDL Ratio: 3
Triglycerides: 174 mg/dL — ABNORMAL HIGH (ref 0.0–149.0)
VLDL: 34.8 mg/dL (ref 0.0–40.0)

## 2023-12-19 LAB — PHOSPHORUS: Phosphorus: 3.7 mg/dL (ref 2.3–4.6)

## 2023-12-19 LAB — VITAMIN B12: Vitamin B-12: 476 pg/mL (ref 211–911)

## 2023-12-19 LAB — IBC PANEL
Iron: 71 ug/dL (ref 42–145)
Saturation Ratios: 16.5 % — ABNORMAL LOW (ref 20.0–50.0)
TIBC: 431.2 ug/dL (ref 250.0–450.0)
Transferrin: 308 mg/dL (ref 212.0–360.0)

## 2023-12-19 LAB — VITAMIN D 25 HYDROXY (VIT D DEFICIENCY, FRACTURES): VITD: 24.68 ng/mL — ABNORMAL LOW (ref 30.00–100.00)

## 2023-12-19 MED ORDER — DAPAGLIFLOZIN PROPANEDIOL 10 MG PO TABS: 10.0000 mg | ORAL_TABLET | Freq: Every day | ORAL | 3 refills | Status: AC

## 2023-12-19 MED ORDER — SERTRALINE HCL 25 MG PO TABS: 25.0000 mg | ORAL_TABLET | Freq: Every day | ORAL | 3 refills | Status: AC

## 2023-12-19 MED ORDER — ATORVASTATIN CALCIUM 40 MG PO TABS: 40.0000 mg | ORAL_TABLET | Freq: Every day | ORAL | 3 refills | Status: AC

## 2023-12-19 NOTE — Assessment & Plan Note (Signed)
 Chronic, stable period on low dose sertraline - continue.

## 2023-12-19 NOTE — Assessment & Plan Note (Signed)
 Chronic, update FLP on atorvastatin 40mg  daily  The ASCVD Risk score (Arnett DK, et al., 2019) failed to calculate for the following reasons:   The 2019 ASCVD risk score is only valid for ages 53 to 26

## 2023-12-19 NOTE — Patient Instructions (Addendum)
 Seguir prolia cada 6 meses. Laboratorios hoy.  Considere vacuna contra shingles en la farmacia.  Regresar en 6 meses para proxima visita.  Traigame copia de living will.  Haga cita con doctor de ojos.

## 2023-12-19 NOTE — Assessment & Plan Note (Signed)
 Alz vs vascular dementia - continue sertraline, consider namenda.

## 2023-12-19 NOTE — Assessment & Plan Note (Signed)
 Update CBC.

## 2023-12-19 NOTE — Progress Notes (Signed)
 Ph: [phone] Fax: [phone]   Patient ID: April Short, female    DOB: Oct 08, 1934, 89 y.o.   MRN: [id number]  This visit was conducted in person.  BP 116/66   Pulse 60   Temp 98.2 F (36.8 C) (Oral)   Ht 5' 1.5" (1.562 m)   Wt 119 lb 6 oz (54.1 kg)   SpO2 96%   BMI 22.19 kg/m    CC: CPE Subjective:   HPI: April Short is a 89 y.o. female presenting on 12/19/2023 for Annual Exam (MCR prt 2 [AWV- 06/20/23]. Pt accompanied by daughters,"Judy" and Nelva Bush   )   Saw health advisor 05/2023 for medicare wellness visit. Note reviewed.   No results found.  Flowsheet Row Office Visit from 09/16/2023 in [facility] [place]  PHQ-2 Total Score 4          09/16/2023   11:39 AM 06/20/2023    9:06 AM 06/07/2023    9:54 AM 03/07/2023   11:07 AM 11/25/2022    1:59 PM  Fall Risk   Falls in the past year? 0 0 0 0 0  Number falls in past yr:  0 0    Injury with Fall?  0 0    Risk for fall due to :  No Fall Risks No Fall Risks    Follow up  Falls prevention discussed;Education provided;Falls evaluation completed Falls evaluation completed       Established with cardiology Dr Allyson Sabal for CAD s/p DES 2017 and chronic HFrEF s/p ICD 2018. Continues carvedilol, [name], Farxiga. Also sees EP Dr Ladona Ridgel for [facility] Bi[facility] ICD placed in [place] 05/2017.   Acute gallstone pancreatitis s/p hospitalization last year.   Known diabetic since age 89 yo, currently only on [place] 10mg  daily. Dose dropped from 10mg  04/2022 due to urinary symptoms (frequency, incontinence) which has since improved. Back on full dose Farxiga 10mg . She doesn't check sugars.  Diabetic retinopathy screen normal 07/2022 - however showed epiretinal membrane - referred to retinologist, appt pending. She denies any vision changes.  H/o cervical cancer, colon cancer.   Neuropathy since 2014. Started in hands, now progressing into feet.  Daughters note significant benefit in mood since starting  sertraline 25mg .   MCI suspect alz vs vascular dementia - avoiding donepezil due to cardiac history. To consider namenda.   Incidentally found portal vein thrombosis - treated with eliquis 5mg  BID x 6 months (started 07/2023).    Preventative: H/o colon cancer. Colonoscopy 11/2011 1 polyp, int hem (done in [place]).  Virtual colonoscopy 04/2016 - [facility] Marina Goodell).  [facility] - desires to stop screening Well woman exam - H/o cervical cancer 2005 s/p total hysterectomy w/BSO.  Recent DEXA 06/2022 - T -4.4 R forearm, LFT -3.2.  Prolia started, last dose 10/27/2023.  Lung cancer screening - not eligible Flu shot - yearly COVID shot - Moderna 09/2019, 10/2019, booster 08/2020  Tetanus shot - ~2010 Pneumonia shot - did have this done ~2016 Shingrix - discussed  Advanced directive discussion - would want Darel Hong to be HCPOA. Does not want resuscitation, intubation, dialysis. Has packet at home - asked to bring [place] copy.  Seat belt use discussed  Sunscreen use discussed. No changing moles on skin.  Non smoker  Alcohol - seldom. H/o alcohol use for 20 yrs  Dentist - years ago, has partials but doesn't use  Eye exam - pending appt with retinologist for epiretinal membrane.  Bowel -  no constipation  Bladder - no incontinence   Daughter Darel Hong who lives in [place] is [employer] 2018, lives with daughter locally From [place], lived in [place] then [place].  Moved to [facility] 2021 to be closer to family  Occ: retired     Relevant past medical, surgical, family and social history reviewed and updated as indicated. Interim medical history since our last visit reviewed. Allergies and medications reviewed and updated. Outpatient Medications Prior to Visit  Medication Sig Dispense Refill   albuterol (PROVENTIL) (2.5 MG/3ML) 0.083% nebulizer solution Take 3 mLs (2.5 mg total) by nebulization 3 (three) times daily as needed for wheezing or shortness of breath. 75 mL 1   aspirin EC 81 MG tablet Take 81 mg  by mouth daily. Swallow whole.     carvedilol (COREG) 25 MG tablet Take 1 tablet (25 mg total) by mouth 2 (two) times daily. 180 tablet 3   Cholecalciferol (VITAMIN D3) 25 MCG (1000 UT) CAPS Take 1 capsule (1,000 Units total) by mouth daily. 30 capsule    PROLIA 60 MG/ML SOSY injection Inject 60 mg into the skin every 6 (six) months. 180 mL 1   sacubitril-valsartan (ENTRESTO) 49-51 MG Take 1 tablet by mouth 2 (two) times daily. 60 tablet 11   atorvastatin (LIPITOR) 40 MG tablet TAKE 1 TABLET BY MOUTH EVERY DAY 90 tablet 0   dapagliflozin propanediol (FARXIGA) 10 MG TABS tablet Take 1 tablet (10 mg total) by mouth daily. 90 tablet 1   sertraline (ZOLOFT) 25 MG tablet TAKE 1 TABLET (25 MG TOTAL) BY MOUTH DAILY. 90 tablet 0   apixaban (ELIQUIS) 5 MG TABS tablet Take 1 tablet (5 mg total) by mouth 2 (two) times daily. (Patient not taking: Reported on 10/25/2023) 60 tablet 0   Facility-Administered Medications Prior to Visit  Medication Dose Route Frequency Provider Last Rate Last Admin   [START ON 04/25/2024] denosumab (PROLIA) injection 60 mg  60 mg Subcutaneous Once Eustaquio Boyden, MD       denosumab (PROLIA) injection 60 mg  60 mg Subcutaneous Once Eustaquio Boyden, MD         Per HPI unless specifically indicated in ROS section below Review of Systems  Constitutional:  Negative for activity change, appetite change, chills, fatigue, fever and unexpected weight change.  HENT:  Negative for hearing loss.   Eyes:  Negative for visual disturbance.  Respiratory:  Negative for cough, chest tightness, shortness of breath and wheezing.   Cardiovascular:  Negative for chest pain, palpitations and leg swelling.  Gastrointestinal:  Negative for abdominal distention, abdominal pain, blood in stool, constipation, diarrhea, nausea and vomiting.  Genitourinary:  Negative for difficulty urinating and hematuria.  Musculoskeletal:  Negative for arthralgias, myalgias and neck pain.  Skin:  Negative for rash.   Neurological:  Negative for dizziness, seizures, syncope and headaches.  Hematological:  Negative for adenopathy. Does not bruise/bleed easily.  Psychiatric/Behavioral:  Negative for dysphoric mood. The patient is not nervous/anxious.     Objective:  BP 116/66   Pulse 60   Temp 98.2 F (36.8 C) (Oral)   Ht 5' 1.5" (1.562 m)   Wt 119 lb 6 oz (54.1 kg)   SpO2 96%   BMI 22.19 kg/m   Wt Readings from Last 3 Encounters:  12/19/23 119 lb 6 oz (54.1 kg)  10/25/23 119 lb (54 kg)  10/03/23 121 lb 6.1 oz (55.1 kg)      Physical Exam Vitals and nursing note reviewed.  Constitutional:  Appearance: Normal appearance. She is not ill-appearing.  HENT:     Head: Normocephalic and atraumatic.     Right Ear: Tympanic membrane, ear canal and external ear normal. There is no impacted cerumen.     Left Ear: Tympanic membrane, ear canal and external ear normal. There is no impacted cerumen.     Mouth/Throat:     Mouth: Mucous membranes are moist.     Pharynx: Oropharynx is clear. No oropharyngeal exudate.  Eyes:     General:        Right eye: No discharge.        Left eye: No discharge.     Extraocular Movements: Extraocular movements intact.     Conjunctiva/sclera: Conjunctivae normal.     Pupils: Pupils are equal, round, and reactive to light.  Neck:     Thyroid: No thyroid mass or thyromegaly.     Vascular: No carotid bruit.  Cardiovascular:     Rate and Rhythm: Normal rate and regular rhythm.     Pulses: Normal pulses.     Heart sounds: Normal heart sounds. No murmur heard. Pulmonary:     Effort: Pulmonary effort is normal. No respiratory distress.     Breath sounds: Normal breath sounds. No wheezing, rhonchi or rales.  Abdominal:     General: Bowel sounds are normal. There is no distension.     Palpations: Abdomen is soft. There is no mass.     Tenderness: There is no abdominal tenderness. There is no guarding or rebound.     Hernia: No hernia is present.  Musculoskeletal:      Cervical back: Normal range of motion and neck supple. No rigidity.     Right lower leg: No edema.     Left lower leg: No edema.  Lymphadenopathy:     Cervical: No cervical adenopathy.  Skin:    General: Skin is warm and dry.     Findings: No rash.  Neurological:     General: No focal deficit present.     Mental Status: She is alert. Mental status is at baseline.  Psychiatric:        Mood and Affect: Mood normal.        Behavior: Behavior normal.       Results for orders placed or performed in visit on 12/19/23  Hemoglobin A1c   Collection Time: 12/19/23 11:46 AM  Result Value Ref Range   Hgb A1c MFr Bld 9.6 (H) 4.6 - 6.5 %  Lipid panel   Collection Time: 12/19/23 11:46 AM  Result Value Ref Range   Cholesterol 155 0 - 200 mg/dL   Triglycerides 098.1 (H) 0.0 - 149.0 mg/dL   HDL 19.14 >78.29 mg/dL   VLDL 56.2 0.0 - 13.0 mg/dL   LDL Cholesterol 73 0 - 99 mg/dL   Total CHOL/HDL Ratio 3    NonHDL 107.67   Comprehensive metabolic panel with GFR   Collection Time: 12/19/23 11:46 AM  Result Value Ref Range   Sodium 137 135 - 145 mEq/L   Potassium 5.2 (H) 3.5 - 5.1 mEq/L   Chloride 103 96 - 112 mEq/L   CO2 27 19 - 32 mEq/L   Glucose, Bld 179 (H) 70 - 99 mg/dL   BUN 36 (H) 6 - 23 mg/dL   Creatinine, Ser 8.65 (H) 0.40 - 1.20 mg/dL   Total Bilirubin 0.5 0.2 - 1.2 mg/dL   Alkaline Phosphatase 77 39 - 117 U/L   AST 17 0 - 37 U/L  ALT 15 0 - 35 U/L   Total Protein 8.0 6.0 - 8.3 g/dL   Albumin 4.2 3.5 - 5.2 g/dL   GFR 21.30 (L) >86.57 mL/min   Calcium 9.0 8.4 - 10.5 mg/dL  Phosphorus   Collection Time: 12/19/23 11:46 AM  Result Value Ref Range   Phosphorus 3.7 2.3 - 4.6 mg/dL  VITAMIN D 25 Hydroxy (Vit-D Deficiency, Fractures)   Collection Time: 12/19/23 11:46 AM  Result Value Ref Range   VITD 24.68 (L) 30.00 - 100.00 ng/mL  Microalbumin / creatinine urine ratio   Collection Time: 12/19/23 11:46 AM  Result Value Ref Range   Microalb, Ur 3.0 (H) 0.0 - 1.9 mg/dL    Creatinine,U 84.6 mg/dL   Microalb Creat Ratio 66.5 (H) 0.0 - 30.0 mg/g  CBC with Differential/Platelet   Collection Time: 12/19/23 11:46 AM  Result Value Ref Range   WBC 5.9 4.0 - 10.5 K/uL   RBC 4.24 3.87 - 5.11 Mil/uL   Hemoglobin 12.1 12.0 - 15.0 g/dL   HCT 96.2 95.2 - 84.1 %   MCV 87.4 78.0 - 100.0 fl   MCHC 32.7 30.0 - 36.0 g/dL   RDW 32.4 (H) 40.1 - 02.7 %   Platelets 173.0 150.0 - 400.0 K/uL   Neutrophils Relative % 55.9 43.0 - 77.0 %   Lymphocytes Relative 33.6 12.0 - 46.0 %   Monocytes Relative 7.6 3.0 - 12.0 %   Eosinophils Relative 2.3 0.0 - 5.0 %   Basophils Relative 0.6 0.0 - 3.0 %   Neutro Abs 3.3 1.4 - 7.7 K/uL   Lymphs Abs 2.0 0.7 - 4.0 K/uL   Monocytes Absolute 0.4 0.1 - 1.0 K/uL   Eosinophils Absolute 0.1 0.0 - 0.7 K/uL   Basophils Absolute 0.0 0.0 - 0.1 K/uL  Vitamin B12   Collection Time: 12/19/23 11:46 AM  Result Value Ref Range   Vitamin B-12 476 211 - 911 pg/mL  Ferritin   Collection Time: 12/19/23 11:46 AM  Result Value Ref Range   Ferritin 11.1 10.0 - 291.0 ng/mL  IBC panel   Collection Time: 12/19/23 11:46 AM  Result Value Ref Range   Iron 71 42 - 145 ug/dL   Transferrin 253.6 644.0 - 360.0 mg/dL   Saturation Ratios 34.7 (L) 20.0 - 50.0 %   TIBC 431.2 250.0 - 450.0 mcg/dL    Assessment & Plan:   Problem List Items Addressed This Visit     MCI (mild cognitive impairment) with memory loss (Chronic)   Alz vs vascular dementia - continue sertraline, consider namenda.       Health maintenance examination - Primary (Chronic)   Preventative protocols reviewed and updated unless pt declined. Discussed healthy diet and lifestyle.       Advanced directives, counseling/discussion (Chronic)   Asked to bring [place] copy.       Type 2 diabetes mellitus with peripheral neuropathy (HCC)   Chronic, on farxiga 10mg  daily and tolerating well. Update A1c.  H/o intolerance to metformin. Avoiding GLP1RA, DPP4 in h/o pancreatitis. Pt not interested in  insulin.       Relevant Medications   sertraline (ZOLOFT) 25 MG tablet   atorvastatin (LIPITOR) 40 MG tablet   dapagliflozin propanediol (FARXIGA) 10 MG TABS tablet   Other Relevant Orders   Hemoglobin A1c (Completed)   Microalbumin / creatinine urine ratio (Completed)   Vitamin B12 (Completed)   Iron deficiency anemia   Update iron panel off iron replacement.       Relevant Orders  Ferritin (Completed)   IBC panel (Completed)   Ischemic cardiomyopathy   Relevant Medications   atorvastatin (LIPITOR) 40 MG tablet   Hyperlipidemia associated with type 2 diabetes mellitus (HCC)   Chronic, update FLP on atorvastatin 40mg  daily  The ASCVD Risk score (Arnett DK, et al., 2019) failed to calculate for the following reasons:   The 2019 ASCVD risk score is only valid for ages 63 to 74       Relevant Medications   atorvastatin (LIPITOR) 40 MG tablet   dapagliflozin propanediol (FARXIGA) 10 MG TABS tablet   Other Relevant Orders   Lipid panel (Completed)   Comprehensive metabolic panel with GFR (Completed)   Heart failure with recovered ejection fraction (HFrecEF) (HCC)   Chronic, seems euvolemic      Relevant Medications   atorvastatin (LIPITOR) 40 MG tablet   CAD S/P percutaneous coronary angioplasty   Appreciate cardiology care.       Relevant Medications   atorvastatin (LIPITOR) 40 MG tablet   ICD (implantable cardioverter-defibrillator) in place   Adjustment disorder with depressed mood   Chronic, stable period on low dose sertraline - continue.       Relevant Medications   sertraline (ZOLOFT) 25 MG tablet   CKD stage 3 due to type 2 diabetes mellitus (HCC)   Update renal panel on full farxiga dose.       Relevant Medications   atorvastatin (LIPITOR) 40 MG tablet   dapagliflozin propanediol (FARXIGA) 10 MG TABS tablet   Other Relevant Orders   Comprehensive metabolic panel with GFR (Completed)   Phosphorus (Completed)   VITAMIN D 25 Hydroxy (Vit-D Deficiency,  Fractures) (Completed)   Microalbumin / creatinine urine ratio (Completed)   Parathyroid hormone, intact (no Ca)   CBC with Differential/Platelet (Completed)   Anemia in chronic kidney disease (CKD)   Update CBC      Relevant Orders   CBC with Differential/Platelet (Completed)   Ferritin (Completed)   IBC panel (Completed)   Osteoporosis   Continues prolia q69mo with vitamin D daily.       Relevant Orders   VITAMIN D 25 Hydroxy (Vit-D Deficiency, Fractures) (Completed)   Epiretinal membrane (ERM) of both eyes   Referred 07/2022 - still needs to schedule appt. Encouraged they call to schedule this.       NAFLD (nonalcoholic fatty liver disease)   Portal vein thrombosis   Took only 1-2 months.  Now off eliquis.       Relevant Medications   atorvastatin (LIPITOR) 40 MG tablet     Meds ordered this encounter  Medications   sertraline (ZOLOFT) 25 MG tablet    Sig: Take 1 tablet (25 mg total) by mouth daily.    Dispense:  90 tablet    Refill:  3   atorvastatin (LIPITOR) 40 MG tablet    Sig: Take 1 tablet (40 mg total) by mouth daily.    Dispense:  90 tablet    Refill:  3   dapagliflozin propanediol (FARXIGA) 10 MG TABS tablet    Sig: Take 1 tablet (10 mg total) by mouth daily.    Dispense:  90 tablet    Refill:  3    Orders Placed This Encounter  Procedures   Hemoglobin A1c   Lipid panel   Comprehensive metabolic panel with GFR   Phosphorus   VITAMIN D 25 Hydroxy (Vit-D Deficiency, Fractures)   Microalbumin / creatinine urine ratio   Parathyroid hormone, intact (no Ca)   CBC with Differential/Platelet  Vitamin B12   Ferritin   IBC panel    Patient Instructions  Seguir prolia cada 6 meses. Laboratorios hoy.  Considere vacuna contra shingles en la farmacia.  Regresar en 6 meses para proxima visita.  Traigame copia de living will.  Haga cita con doctor de ojos.   Follow up plan: Return in about 6 months (around 06/19/2024), or if symptoms worsen or fail  to improve, for follow up visit.  Eustaquio Boyden, MD

## 2023-12-19 NOTE — Assessment & Plan Note (Signed)
 Update renal panel on full farxiga dose.

## 2023-12-19 NOTE — Assessment & Plan Note (Signed)
 Took only 1-2 months.  Now off eliquis.

## 2023-12-19 NOTE — Assessment & Plan Note (Signed)
 Chronic, on farxiga 10mg  daily and tolerating well. Update A1c.  H/o intolerance to metformin. Avoiding GLP1RA, DPP4 in h/o pancreatitis. Pt not interested in insulin.

## 2023-12-19 NOTE — Assessment & Plan Note (Signed)
Appreciate cardiology care.  °

## 2023-12-19 NOTE — Assessment & Plan Note (Signed)
Asked to bring us copy.  

## 2023-12-19 NOTE — Assessment & Plan Note (Signed)
 Update iron panel off iron replacement.

## 2023-12-19 NOTE — Assessment & Plan Note (Signed)
 Preventative protocols reviewed and updated unless pt declined. Discussed healthy diet and lifestyle.

## 2023-12-19 NOTE — Assessment & Plan Note (Signed)
 Continues prolia q27mo with vitamin D daily.

## 2023-12-19 NOTE — Assessment & Plan Note (Signed)
 Referred 07/2022 - still needs to schedule appt. Encouraged they call to schedule this.

## 2023-12-19 NOTE — Assessment & Plan Note (Signed)
Chronic, seems euvolemic.

## 2023-12-20 LAB — PARATHYROID HORMONE, INTACT (NO CA): PTH: 92 pg/mL — ABNORMAL HIGH (ref 16–77)

## 2023-12-26 ENCOUNTER — Ambulatory Visit (INDEPENDENT_AMBULATORY_CARE_PROVIDER_SITE_OTHER): Payer: Self-pay

## 2023-12-26 ENCOUNTER — Other Ambulatory Visit: Payer: Self-pay | Admitting: Family Medicine

## 2023-12-26 DIAGNOSIS — I255 Ischemic cardiomyopathy: Secondary | ICD-10-CM | POA: Diagnosis not present

## 2023-12-26 DIAGNOSIS — I5022 Chronic systolic (congestive) heart failure: Secondary | ICD-10-CM

## 2023-12-26 MED ORDER — GLIPIZIDE 5 MG PO TABS: 5.0000 mg | ORAL_TABLET | Freq: Every day | ORAL | 6 refills | Status: AC

## 2023-12-26 MED ORDER — VITAMIN D3 25 MCG (1000 UT) PO CAPS: 2.0000 | ORAL_CAPSULE | Freq: Every day | ORAL | Status: AC

## 2023-12-27 LAB — CUP PACEART REMOTE DEVICE CHECK
Battery Remaining Longevity: 26 mo
Battery Remaining Percentage: 29 %
Battery Voltage: 2.9 V
Brady Statistic AP VP Percent: 10 %
Brady Statistic AP VS Percent: 1 %
Brady Statistic AS VP Percent: 87 %
Brady Statistic AS VS Percent: 3.1 %
Brady Statistic RA Percent Paced: 10 %
Date Time Interrogation Session: 20250407040021
HighPow Impedance: 73 Ohm
HighPow Impedance: 73 Ohm
Implantable Lead Connection Status: 753985
Implantable Lead Connection Status: 753985
Implantable Lead Connection Status: 753985
Implantable Lead Implant Date: 20180906
Implantable Lead Implant Date: 20180906
Implantable Lead Implant Date: 20180906
Implantable Lead Location: 753858
Implantable Lead Location: 753859
Implantable Lead Location: 753860
Implantable Pulse Generator Implant Date: 20180906
Lead Channel Impedance Value: 1600 Ohm
Lead Channel Impedance Value: 460 Ohm
Lead Channel Impedance Value: 550 Ohm
Lead Channel Pacing Threshold Amplitude: 0.75 V
Lead Channel Pacing Threshold Amplitude: 0.75 V
Lead Channel Pacing Threshold Amplitude: 1.25 V
Lead Channel Pacing Threshold Pulse Width: 0.5 ms
Lead Channel Pacing Threshold Pulse Width: 0.5 ms
Lead Channel Pacing Threshold Pulse Width: 0.5 ms
Lead Channel Sensing Intrinsic Amplitude: 11.9 mV
Lead Channel Sensing Intrinsic Amplitude: 2.2 mV
Lead Channel Setting Pacing Amplitude: 2 V
Lead Channel Setting Pacing Amplitude: 2.5 V
Lead Channel Setting Pacing Amplitude: 2.5 V
Lead Channel Setting Pacing Pulse Width: 0.5 ms
Lead Channel Setting Pacing Pulse Width: 0.5 ms
Lead Channel Setting Sensing Sensitivity: 0.5 mV
Pulse Gen Serial Number: 9776461
Zone Setting Status: 755011

## 2023-12-28 ENCOUNTER — Encounter: Payer: Self-pay | Admitting: Internal Medicine

## 2024-01-02 ENCOUNTER — Ambulatory Visit: Payer: 59 | Admitting: Podiatry

## 2024-01-16 ENCOUNTER — Encounter: Payer: Self-pay | Admitting: Podiatry

## 2024-01-16 ENCOUNTER — Ambulatory Visit (INDEPENDENT_AMBULATORY_CARE_PROVIDER_SITE_OTHER): Admitting: Podiatry

## 2024-01-16 VITALS — Ht 61.5 in | Wt 119.4 lb

## 2024-01-16 DIAGNOSIS — E1142 Type 2 diabetes mellitus with diabetic polyneuropathy: Secondary | ICD-10-CM | POA: Diagnosis not present

## 2024-01-16 DIAGNOSIS — M79674 Pain in right toe(s): Secondary | ICD-10-CM

## 2024-01-16 DIAGNOSIS — M79675 Pain in left toe(s): Secondary | ICD-10-CM

## 2024-01-16 DIAGNOSIS — B351 Tinea unguium: Secondary | ICD-10-CM | POA: Diagnosis not present

## 2024-01-22 ENCOUNTER — Encounter: Payer: Self-pay | Admitting: Podiatry

## 2024-01-22 NOTE — Progress Notes (Signed)
  Subjective:  Patient ID: April Short, female    DOB: April 06, 1935,  MRN: [id number]  April Short presents to clinic today for at risk foot care with history of diabetic neuropathy and painful, discolored, thick toenails which interfere with daily activities. She is accompanied by her daughter on today's visit. Chief Complaint  Patient presents with   Nail Problem    Pt is here for [facility] unsure of last A1C PCP is Dr Mariam Shingles and LOV was in March.   New problem(s): None.   PCP is Claire Crick, MD.  No Known Allergies  Review of Systems: Negative except as noted in the HPI.  Objective: No changes noted in today's physical examination. There were no vitals filed for this visit. April Short is a pleasant 89 y.o. female WD, WN in NAD. AAO x 3.  Vascular Examination: CFT <3 seconds b/l. DP pulses faintly palpable b/l. PT pulses nonpalpable b/l. Digital hair absent. Skin temperature gradient warm to warm b/l. No pain with calf compression. No ischemia or gangrene. No cyanosis or clubbing noted b/l. No edema noted b/l LE.   Neurological Examination: Protective sensation diminished with 10g monofilament b/l.  Dermatological Examination: Pedal skin warm and supple b/l. No open wounds b/l. No interdigital macerations. Toenails 2-5 b/l thick, discolored, elongated with subungual debris and pain on dorsal palpation.  Toenail(s) bilateral great toes mycotic with adequate length. No hyperkeratotic nor porokeratotic lesions present on today's visit.  Musculoskeletal Examination: Muscle strength 5/5 to all lower extremity muscle groups bilaterally. HAV with bunion deformity noted b/l LE.  Radiographs: None  Last HgA1c:      Latest Ref Rng & Units 12/19/2023   11:46 AM 09/16/2023   11:40 AM 06/07/2023    9:55 AM  Hemoglobin A1C  Hemoglobin-A1c 4.6 - 6.5 % 9.6  7.8  9.3    Assessment/Plan: 1. Pain due to onychomycosis of toenails of both feet   2. Diabetic polyneuropathy  associated with type 2 diabetes mellitus [facility])    Consent given for treatment. Patient examined. All patient's and/or POA's questions/concerns addressed on today's visit. Mycotic toenails 2-5 debrided in length and girth without incident. Continue foot and shoe inspections daily. Monitor blood glucose per PCP/Endocrinologist's recommendations.Continue soft, supportive shoe gear daily. Report any pedal injuries to medical professional. Call office if there are any quesitons/concerns. -Patient/POA to call should there be question/concern in the interim.   Return in about 3 months (around 04/16/2024).  [name], DPM      [facility] LOCATION: [address], [place] [address]                   Office [phone]   [facility] LOCATION: [address] [place], [place] [address] Office [phone]

## 2024-02-06 NOTE — Progress Notes (Signed)
 Remote ICD transmission.

## 2024-02-06 NOTE — Addendum Note (Signed)
 Addended by: Edra Govern D on: 02/06/2024 05:46 PM   Modules accepted: Orders

## 2024-03-26 ENCOUNTER — Ambulatory Visit: Payer: Self-pay

## 2024-03-26 ENCOUNTER — Other Ambulatory Visit (HOSPITAL_COMMUNITY): Payer: Self-pay

## 2024-03-26 ENCOUNTER — Telehealth: Payer: Self-pay

## 2024-03-26 DIAGNOSIS — I255 Ischemic cardiomyopathy: Secondary | ICD-10-CM

## 2024-03-26 NOTE — Telephone Encounter (Signed)
 Pt ready for scheduling for PROLIA  on or after : 04/25/24  Option# 1: Buy/Bill (Office supplied medication)  Out-of-pocket cost due at time of clinic visit: $0  Number of injection/visits approved: 2  Primary: UHC-MEDICARE DUAL COMPLETE Prolia  co-insurance: 0% Admin fee co-insurance: 0%  Secondary: --- Prolia  co-insurance:  Admin fee co-insurance:   Medical Benefit Details: Date Benefits were checked: 03/26/24 Deductible: $257 Met of $257 Required/ Coinsurance: 0%/ Admin Fee: 0%  Prior Auth: APPROVED PA# [id number] Expiration Date: 08/01/23-07/31/24  # of doses approved: 2 ----------------------------------------------------------------------- Option# 2- Med Obtained from pharmacy:  Pharmacy benefit: Copay $0 (Paid to pharmacy) Admin Fee: 0% (Pay at clinic)  Prior Auth: N/A PA# Expiration Date:   # of doses approved:   If patient wants fill through the pharmacy benefit please send prescription to: [email or web address], and include estimated need by date in rx notes. Pharmacy will ship medication directly to the office.  Patient NOT eligible for Prolia  Copay Card. Copay Card can make patient's cost as little as $25. Link to apply: [email or web address]://[email or web address]  ** This summary of benefits is an estimation of the patient's out-of-pocket cost. Exact cost may very based on individual plan coverage.

## 2024-03-26 NOTE — Telephone Encounter (Signed)
 Prolia  VOB initiated via [email or web address]  Next Prolia  inj DUE: 04/25/24

## 2024-03-26 NOTE — Telephone Encounter (Signed)
 SABRA

## 2024-03-27 LAB — CUP PACEART REMOTE DEVICE CHECK
Battery Remaining Longevity: 24 mo
Battery Remaining Percentage: 26 %
Battery Voltage: 2.9 V
Brady Statistic AP VP Percent: 12 %
Brady Statistic AP VS Percent: 1 %
Brady Statistic AS VP Percent: 84 %
Brady Statistic AS VS Percent: 2.9 %
Brady Statistic RA Percent Paced: 12 %
Date Time Interrogation Session: 20250707040018
HighPow Impedance: 79 Ohm
HighPow Impedance: 79 Ohm
Implantable Lead Connection Status: 753985
Implantable Lead Connection Status: 753985
Implantable Lead Connection Status: 753985
Implantable Lead Implant Date: 20180906
Implantable Lead Implant Date: 20180906
Implantable Lead Implant Date: 20180906
Implantable Lead Location: 753858
Implantable Lead Location: 753859
Implantable Lead Location: 753860
Implantable Pulse Generator Implant Date: 20180906
Lead Channel Impedance Value: 1550 Ohm
Lead Channel Impedance Value: 460 Ohm
Lead Channel Impedance Value: 590 Ohm
Lead Channel Pacing Threshold Amplitude: 0.75 V
Lead Channel Pacing Threshold Amplitude: 0.75 V
Lead Channel Pacing Threshold Amplitude: 1.25 V
Lead Channel Pacing Threshold Pulse Width: 0.5 ms
Lead Channel Pacing Threshold Pulse Width: 0.5 ms
Lead Channel Pacing Threshold Pulse Width: 0.5 ms
Lead Channel Sensing Intrinsic Amplitude: 1.5 mV
Lead Channel Sensing Intrinsic Amplitude: 11.9 mV
Lead Channel Setting Pacing Amplitude: 2 V
Lead Channel Setting Pacing Amplitude: 2.5 V
Lead Channel Setting Pacing Amplitude: 2.5 V
Lead Channel Setting Pacing Pulse Width: 0.5 ms
Lead Channel Setting Pacing Pulse Width: 0.5 ms
Lead Channel Setting Sensing Sensitivity: 0.5 mV
Pulse Gen Serial Number: 9776461
Zone Setting Status: 755011

## 2024-03-29 ENCOUNTER — Ambulatory Visit: Payer: Self-pay | Admitting: Internal Medicine

## 2024-04-16 ENCOUNTER — Ambulatory Visit (INDEPENDENT_AMBULATORY_CARE_PROVIDER_SITE_OTHER): Admitting: Podiatry

## 2024-04-16 ENCOUNTER — Encounter: Payer: Self-pay | Admitting: Podiatry

## 2024-04-16 DIAGNOSIS — M79674 Pain in right toe(s): Secondary | ICD-10-CM

## 2024-04-16 DIAGNOSIS — E1142 Type 2 diabetes mellitus with diabetic polyneuropathy: Secondary | ICD-10-CM | POA: Diagnosis not present

## 2024-04-16 DIAGNOSIS — M79675 Pain in left toe(s): Secondary | ICD-10-CM | POA: Diagnosis not present

## 2024-04-16 DIAGNOSIS — B351 Tinea unguium: Secondary | ICD-10-CM

## 2024-04-19 ENCOUNTER — Encounter: Payer: Self-pay | Admitting: Podiatry

## 2024-04-19 NOTE — Progress Notes (Signed)
  Subjective:  Patient ID: April Short, female    DOB: June 26, 1935,  MRN: [id number]  April Short presents to clinic today for at risk foot care with history of diabetic neuropathy and painful thick toenails that are difficult to trim. Pain interferes with ambulation. Aggravating factors include wearing enclosed shoe gear. Pain is relieved with periodic professional debridement.  Chief Complaint  Patient presents with   [facility]    Rm4 Diabetic foot Care/ Dr. Rilla last visit December 19 2023   New problem(s): None.   PCP is April Baller, MD.  No Known Allergies  Review of Systems: Negative except as noted in the HPI.  Objective: No changes noted in today's physical examination. There were no vitals filed for this visit. April Short is a pleasant 89 y.o. female WD, WN in NAD. AAO x 3.  Vascular Examination: CFT <3 seconds b/l. DP pulses faintly palpable b/l. PT pulses nonpalpable b/l. Digital hair absent. Skin temperature gradient warm to warm b/l. No pain with calf compression. No ischemia or gangrene. No cyanosis or clubbing noted b/l. No edema noted b/l LE.   Neurological Examination:  Protective sensation diminished with 10g monofilament b/l.  Dermatological Examination: Pedal skin warm and supple b/l. No open wounds b/l. No interdigital macerations. Toenails 1-5 b/l thick, discolored, elongated with subungual debris and pain on dorsal palpation. No hyperkeratotic nor porokeratotic lesions.  No corns, calluses, nor porokeratotic lesions.   Musculoskeletal Examination: Muscle strength 5/5 to all lower extremity muscle groups bilaterally. HAV with bunion deformity noted b/l LE.  Radiographs: None  Last HgA1c:      Latest Ref Rng & Units 12/19/2023   11:46 AM 09/16/2023   11:40 AM 06/07/2023    9:55 AM  Hemoglobin A1C  Hemoglobin-A1c 4.6 - 6.5 % 9.6  7.8  9.3    Assessment/Plan: 1. Pain due to onychomycosis of toenails of both feet   2. Diabetic  polyneuropathy associated with type 2 diabetes mellitus (HCC)     Patient was evaluated and treated. All patient's and/or POA's questions/concerns addressed on today's visit. Toenails 1-5 debrided in length and girth without incident. Continue foot and shoe inspections daily. Monitor blood glucose per PCP/Endocrinologist's recommendations. Continue soft, supportive shoe gear daily. Report any pedal injuries to medical professional. Call office if there are any questions/concerns. -Patient/POA to call should there be question/concern in the interim.   Return in about 3 months (around 07/17/2024).  April Short, DPM      [facility] LOCATION: [address] [place] [address]                   Office [phone]   [facility] LOCATION: [address] [place], [place] [address] Office [phone]

## 2024-05-09 ENCOUNTER — Other Ambulatory Visit: Payer: Self-pay | Admitting: *Deleted

## 2024-05-09 DIAGNOSIS — M810 Age-related osteoporosis without current pathological fracture: Secondary | ICD-10-CM

## 2024-05-11 ENCOUNTER — Other Ambulatory Visit

## 2024-05-11 DIAGNOSIS — M810 Age-related osteoporosis without current pathological fracture: Secondary | ICD-10-CM | POA: Diagnosis not present

## 2024-05-11 LAB — BASIC METABOLIC PANEL WITH GFR
BUN: 37 mg/dL — ABNORMAL HIGH (ref 6–23)
CO2: 29 meq/L (ref 19–32)
Calcium: 9.2 mg/dL (ref 8.4–10.5)
Chloride: 99 meq/L (ref 96–112)
Creatinine, Ser: 1.39 mg/dL — ABNORMAL HIGH (ref 0.40–1.20)
GFR: 34.11 mL/min — ABNORMAL LOW (ref >60.00)
Glucose, Bld: 212 mg/dL — ABNORMAL HIGH (ref 70–99)
Potassium: 5.3 meq/L — ABNORMAL HIGH (ref 3.5–5.1)
Sodium: 136 meq/L (ref 135–145)

## 2024-05-13 ENCOUNTER — Ambulatory Visit: Payer: Self-pay | Admitting: Family Medicine

## 2024-05-13 DIAGNOSIS — M810 Age-related osteoporosis without current pathological fracture: Secondary | ICD-10-CM

## 2024-05-13 DIAGNOSIS — E1122 Type 2 diabetes mellitus with diabetic chronic kidney disease: Secondary | ICD-10-CM

## 2024-05-17 ENCOUNTER — Ambulatory Visit (INDEPENDENT_AMBULATORY_CARE_PROVIDER_SITE_OTHER)

## 2024-05-17 DIAGNOSIS — M810 Age-related osteoporosis without current pathological fracture: Secondary | ICD-10-CM | POA: Diagnosis not present

## 2024-05-17 MED ORDER — DENOSUMAB 60 MG/ML ~~LOC~~ SOSY: 60.0000 mg | PREFILLED_SYRINGE | SUBCUTANEOUS | Status: AC

## 2024-05-17 NOTE — Progress Notes (Signed)
 Per orders of Dr. Arlyss Solian, injection of Prolia   given by Danna CINDERELLA Hummer in left arm. Patient tolerated injection well. Patient will make appointment for 6 month. Per pcp request patient has lab appointment set up for 1 week lab check.

## 2024-05-24 ENCOUNTER — Other Ambulatory Visit

## 2024-05-28 ENCOUNTER — Other Ambulatory Visit (INDEPENDENT_AMBULATORY_CARE_PROVIDER_SITE_OTHER)

## 2024-05-28 DIAGNOSIS — M810 Age-related osteoporosis without current pathological fracture: Secondary | ICD-10-CM

## 2024-05-28 DIAGNOSIS — E1122 Type 2 diabetes mellitus with diabetic chronic kidney disease: Secondary | ICD-10-CM | POA: Diagnosis not present

## 2024-05-28 DIAGNOSIS — N183 Chronic kidney disease, stage 3 unspecified: Secondary | ICD-10-CM

## 2024-05-28 LAB — BASIC METABOLIC PANEL WITH GFR
BUN: 33 mg/dL — ABNORMAL HIGH (ref 6–23)
CO2: 26 meq/L (ref 19–32)
Calcium: 9.3 mg/dL (ref 8.4–10.5)
Chloride: 104 meq/L (ref 96–112)
Creatinine, Ser: 1.37 mg/dL — ABNORMAL HIGH (ref 0.40–1.20)
GFR: 34.7 mL/min — ABNORMAL LOW (ref >60.00)
Glucose, Bld: 174 mg/dL — ABNORMAL HIGH (ref 70–99)
Potassium: 4.8 meq/L (ref 3.5–5.1)
Sodium: 136 meq/L (ref 135–145)

## 2024-05-30 ENCOUNTER — Ambulatory Visit: Payer: Self-pay | Admitting: Family Medicine

## 2024-06-25 ENCOUNTER — Ambulatory Visit: Admitting: Family Medicine

## 2024-06-25 ENCOUNTER — Encounter: Payer: Self-pay | Admitting: Family Medicine

## 2024-06-25 ENCOUNTER — Ambulatory Visit: Payer: Self-pay

## 2024-06-25 VITALS — BP 110/58 | HR 89 | Temp 98.5°F | Ht 61.5 in | Wt 127.0 lb

## 2024-06-25 DIAGNOSIS — Z95810 Presence of automatic (implantable) cardiac defibrillator: Secondary | ICD-10-CM

## 2024-06-25 DIAGNOSIS — F4321 Adjustment disorder with depressed mood: Secondary | ICD-10-CM | POA: Diagnosis not present

## 2024-06-25 DIAGNOSIS — E1122 Type 2 diabetes mellitus with diabetic chronic kidney disease: Secondary | ICD-10-CM

## 2024-06-25 DIAGNOSIS — E1142 Type 2 diabetes mellitus with diabetic polyneuropathy: Secondary | ICD-10-CM

## 2024-06-25 DIAGNOSIS — N183 Chronic kidney disease, stage 3 unspecified: Secondary | ICD-10-CM | POA: Diagnosis not present

## 2024-06-25 DIAGNOSIS — I255 Ischemic cardiomyopathy: Secondary | ICD-10-CM | POA: Diagnosis not present

## 2024-06-25 DIAGNOSIS — D631 Anemia in chronic kidney disease: Secondary | ICD-10-CM | POA: Diagnosis not present

## 2024-06-25 DIAGNOSIS — Z23 Encounter for immunization: Secondary | ICD-10-CM

## 2024-06-25 DIAGNOSIS — G3184 Mild cognitive impairment, so stated: Secondary | ICD-10-CM | POA: Diagnosis not present

## 2024-06-25 DIAGNOSIS — N1832 Chronic kidney disease, stage 3b: Secondary | ICD-10-CM | POA: Diagnosis not present

## 2024-06-25 DIAGNOSIS — R051 Acute cough: Secondary | ICD-10-CM

## 2024-06-25 DIAGNOSIS — R296 Repeated falls: Secondary | ICD-10-CM

## 2024-06-25 DIAGNOSIS — G4700 Insomnia, unspecified: Secondary | ICD-10-CM | POA: Diagnosis not present

## 2024-06-25 DIAGNOSIS — H35373 Puckering of macula, bilateral: Secondary | ICD-10-CM

## 2024-06-25 DIAGNOSIS — Z7189 Other specified counseling: Secondary | ICD-10-CM

## 2024-06-25 DIAGNOSIS — M810 Age-related osteoporosis without current pathological fracture: Secondary | ICD-10-CM

## 2024-06-25 LAB — POCT GLYCOSYLATED HEMOGLOBIN (HGB A1C): Hemoglobin A1C: 7.2 % — AB (ref 4.0–5.6)

## 2024-06-25 MED ORDER — AMOXICILLIN 875 MG PO TABS: 875.0000 mg | ORAL_TABLET | Freq: Two times a day (BID) | ORAL | 0 refills | Status: AC

## 2024-06-25 NOTE — Patient Instructions (Addendum)
 Limitar usar el telefono de noche. Limitar siestas durante el dia.  Azucar esta bien controlada hoy. A seguir medicamentos.  Para la tos - puede tratar zyrtec o claritin regular para alergias  Si fiebre o tos empeora, tome antibiotico que he mandado a su farmacia.  Recomiendo usar baston para ayudar a Engineer, agricultural.  Gusto verlos hoy - regresar en 6 meses para proimo fisico/medicare wellness visit.

## 2024-06-25 NOTE — Progress Notes (Unsigned)
 Ph: [phone] Fax: [phone]   Patient ID: April Short, female    DOB: [birth year], 89 y.o.   MRN: [id number]  This visit was conducted in person.  BP (!) 110/58   Pulse 89   Temp 98.5 F (36.9 C) (Oral)   Ht 5' 1.5 (1.562 m)   Wt 127 lb (57.6 kg)   SpO2 95%   BMI 23.61 kg/m    CC: 6 mo f/u visit  Subjective:   HPI: April Short is a 89 y.o. female presenting on 06/25/2024 for Medical Management of Chronic Issues (6 mo f/u)   Upcoming trip tp [place] this weekend for grandson's wedding.   2 wk h/o cough, sone hoarseness.  No chest or head congestion. No fevers/chills, ST, sinus pressure, headache, or ear pain.  Recent sick contact.   Poor sleep - partly from telephone use at night time. Also takes naps during the day in recliner.   OP - on prolia , latest injection 05/17/2024. Next due 12/2024.   2 falls this past month - doesn't want to use ambulatory assistive device. Fortunately no injury.   DM - does not regularly check sugars. Compliant with antihyperglycemic regimen which includes: glipizide  5mg  daily, farxiga  10mg  daily. Denies low sugars or hypoglycemic symptoms. Denies paresthesias, blurry vision. Last diabetic eye exam upcoming to see retinologist for epiretinal membrane. Glucometer brand: unsure. Last foot exam: 08/2023. DSME: . Diabetic neuropathy noted on latest podiatry foot exam (03/2024).  Lab Results  Component Value Date   HGBA1C 7.2 (A) 06/25/2024   Diabetic Foot Exam - Simple   No data filed    Lab Results  Component Value Date   MICROALBUR 3.0 (H) 12/19/2023     Advanced directive- brings it in today, was reviewed and will be sent for scanning. Does not want prolonged life support if terminal condition, does not want feeding tube or dialysis.      Relevant past medical, surgical, family and social history reviewed and updated as indicated. Interim medical history since our last visit reviewed. Allergies and medications reviewed  and updated. Outpatient Medications Prior to Visit  Medication Sig Dispense Refill   albuterol  (PROVENTIL ) (2.5 MG/3ML) 0.083% nebulizer solution Take 3 mLs (2.5 mg total) by nebulization 3 (three) times daily as needed for wheezing or shortness of breath. 75 mL 1   aspirin EC 81 MG tablet Take 81 mg by mouth daily. Swallow whole.     atorvastatin  (LIPITOR) 40 MG tablet Take 1 tablet (40 mg total) by mouth daily. 90 tablet 3   carvedilol  (COREG ) 25 MG tablet Take 1 tablet (25 mg total) by mouth 2 (two) times daily. 180 tablet 3   Cholecalciferol (VITAMIN D3) 25 MCG (1000 UT) CAPS Take 2 capsules (2,000 Units total) by mouth daily.     dapagliflozin  propanediol (FARXIGA ) 10 MG TABS tablet Take 1 tablet (10 mg total) by mouth daily. 90 tablet 3   glipiZIDE  (GLUCOTROL ) 5 MG tablet Take 1 tablet (5 mg total) by mouth daily before breakfast. 30 tablet 6   PROLIA  60 MG/ML SOSY injection Inject 60 mg into the skin every 6 (six) months. 180 mL 1   sacubitril -valsartan  (ENTRESTO ) 49-51 MG Take 1 tablet by mouth 2 (two) times daily. 60 tablet 11   sertraline  (ZOLOFT ) 25 MG tablet Take 1 tablet (25 mg total) by mouth daily. 90 tablet 3   Facility-Administered Medications Prior to Visit  Medication Dose Route Frequency Provider Last Rate Last Admin   [START ON  11/13/2024] denosumab  (PROLIA ) injection 60 mg  60 mg Subcutaneous Q6 months Rilla Baller, MD         Per HPI unless specifically indicated in ROS section below Review of Systems  Objective:  BP (!) 110/58   Pulse 89   Temp 98.5 F (36.9 C) (Oral)   Ht 5' 1.5 (1.562 m)   Wt 127 lb (57.6 kg)   SpO2 95%   BMI 23.61 kg/m   Wt Readings from Last 3 Encounters:  06/25/24 127 lb (57.6 kg)  01/16/24 119 lb 6.1 oz (54.2 kg)  12/19/23 119 lb 6 oz (54.1 kg)      Physical Exam Vitals and nursing note reviewed.  Constitutional:      Appearance: Normal appearance. She is not ill-appearing.  HENT:     Head: Normocephalic and atraumatic.      Right Ear: Tympanic membrane, ear canal and external ear normal. There is no impacted cerumen.     Left Ear: Tympanic membrane, ear canal and external ear normal. There is no impacted cerumen.     Nose: Nose normal. No congestion or rhinorrhea.     Mouth/Throat:     Mouth: Mucous membranes are moist.     Pharynx: Oropharynx is clear. No oropharyngeal exudate or posterior oropharyngeal erythema.  Eyes:     Extraocular Movements: Extraocular movements intact.     Conjunctiva/sclera: Conjunctivae normal.     Pupils: Pupils are equal, round, and reactive to light.  Cardiovascular:     Rate and Rhythm: Normal rate and regular rhythm.     Pulses: Normal pulses.     Heart sounds: Normal heart sounds. No murmur heard. Pulmonary:     Effort: Pulmonary effort is normal. No respiratory distress.     Breath sounds: Normal breath sounds. No wheezing, rhonchi or rales.  Lymphadenopathy:     Head:     Right side of head: No submental, submandibular, tonsillar, preauricular or posterior auricular adenopathy.     Left side of head: No submental, submandibular, tonsillar, preauricular or posterior auricular adenopathy.     Cervical: No cervical adenopathy.     Right cervical: No superficial cervical adenopathy.    Left cervical: No superficial cervical adenopathy.     Upper Body:     Right upper body: No supraclavicular adenopathy.     Left upper body: No supraclavicular adenopathy.  Skin:    Findings: No rash.  Neurological:     Mental Status: She is alert.  Psychiatric:        Mood and Affect: Mood normal.        Behavior: Behavior normal.       Results for orders placed or performed in visit on 06/25/24  POCT glycosylated hemoglobin (Hb A1C)   Collection Time: 06/25/24 11:16 AM  Result Value Ref Range   Hemoglobin A1C 7.2 (A) 4.0 - 5.6 %   HbA1c POC (<> result, manual entry)     HbA1c, POC (prediabetic range)     HbA1c, POC (controlled diabetic range)     Lab Results  Component  Value Date   NA 136 05/28/2024   CL 104 05/28/2024   K 4.8 05/28/2024   CO2 26 05/28/2024   BUN 33 (H) 05/28/2024   CREATININE 1.37 (H) 05/28/2024   GFR 34.70 (L) 05/28/2024   CALCIUM  9.3 05/28/2024   PHOS 3.7 12/19/2023   ALBUMIN 4.2 12/19/2023   GLUCOSE 174 (H) 05/28/2024    Lab Results  Component Value Date   WBC  5.9 12/19/2023   HGB 12.1 12/19/2023   HCT 37.0 12/19/2023   MCV 87.4 12/19/2023   PLT 173.0 12/19/2023    U microalb/cr ratio = 66.5  Assessment & Plan:   Problem List Items Addressed This Visit     Type 2 diabetes mellitus with peripheral neuropathy (HCC) - Primary   Chronic, improved on current regimen of glipizide  and farxiga .  Avoid metformin in CKD and previously caused GI upset Avoid in h/o pancreatitis.  They have previously declined injectable medication.       Relevant Orders   POCT glycosylated hemoglobin (Hb A1C) (Completed)   ICD (implantable cardioverter-defibrillator) in place   Adjustment disorder with depressed mood   Continue low dose sertraline        MCI (mild cognitive impairment) with memory loss (Chronic)   Ongoing difficulty - thought alz vs vascular dementia.  Avoiding donepezil in cardiac history.  Consider namenda trial.       Advanced directives, counseling/discussion (Chronic)   Advanced directive- brought today, was reviewed and will be sent for scanning. Does not want prolonged life support if terminal condition, does not want feeding tube or dialysis.       CKD stage 3 due to type 2 diabetes mellitus (HCC)   Tolerating farxiga  - continue this with Entresto  in h/o diabetic nephropathy with microalbuminuria.       Anemia in chronic kidney disease (CKD)   H/o this, latest CBC  with normal Hgb. Will continue to monitor.       Osteoporosis   Continue prolia  Q61mo. Latest injection was 05/17/2024      Epiretinal membrane (ERM) of both eyes   Pending retina specialist appt.       Cough   Lungs clear, no signs of  bacterial infection.  Possible allergic component - discussed antihistamine use. Given age and comorbidities and upcoming travel, provided with WASP for amoxicillin course with indications when to fill.       Insomnia   Reviewed sleep hygiene measures - specifically recommended against screen use at night, and try to avoid daytime naps.       Recurrent falls   Recommend ambulatory assistive device use ie cane, walker.  Discussed increased risk of fracture in osteoporosis.  Discussed how diabetic neuropathy can contribute to unsteadiness and increased fall risk Offered outpatient PT for balance training/fall prevention - will consider. Memory issues may limit full benefit of physical therapy program.       Other Visit Diagnoses       Encounter for immunization       Relevant Orders   Flu vaccine HIGH DOSE PF(Fluzone Trivalent) (Completed)        Meds ordered this encounter  Medications   amoxicillin (AMOXIL) 875 MG tablet    Sig: Take 1 tablet (875 mg total) by mouth 2 (two) times daily for 10 days.    Dispense:  20 tablet    Refill:  0    Orders Placed This Encounter  Procedures   Flu vaccine HIGH DOSE PF(Fluzone Trivalent)   POCT glycosylated hemoglobin (Hb A1C)    Patient Instructions  Limitar usar el telefono de noche. Limitar siestas durante el dia.  Azucar esta bien controlada hoy. A seguir medicamentos.  Para la tos - puede tratar zyrtec o claritin regular para alergias  Si fiebre o tos empeora, tome antibiotico que he mandado a su farmacia.  Recomiendo usar baston para ayudar a Engineer, agricultural.  Gusto verlos hoy - regresar en 6 meses para  proimo fisico/medicare wellness visit.   Follow up plan: Return in about 6 months (around 12/24/2024), or if symptoms worsen or fail to improve, for annual exam, prior fasting for blood work, medicare wellness visit.  Anton Blas, MD

## 2024-06-25 NOTE — Assessment & Plan Note (Signed)
 Advanced directive- brought today, was reviewed and will be sent for scanning. Does not want prolonged life support if terminal condition, does not want feeding tube or dialysis.

## 2024-06-26 DIAGNOSIS — R059 Cough, unspecified: Secondary | ICD-10-CM | POA: Insufficient documentation

## 2024-06-26 DIAGNOSIS — R296 Repeated falls: Secondary | ICD-10-CM | POA: Insufficient documentation

## 2024-06-26 DIAGNOSIS — G4700 Insomnia, unspecified: Secondary | ICD-10-CM | POA: Insufficient documentation

## 2024-06-26 NOTE — Assessment & Plan Note (Signed)
 Continue prolia  Q6mo. Latest injection was 05/17/2024

## 2024-06-26 NOTE — Assessment & Plan Note (Signed)
Continue low dose sertraline.  

## 2024-06-26 NOTE — Assessment & Plan Note (Signed)
 Chronic, improved on current regimen of glipizide  and farxiga .  Avoid metformin in CKD and previously caused GI upset Avoid in h/o pancreatitis.  They have previously declined injectable medication.

## 2024-06-26 NOTE — Assessment & Plan Note (Signed)
 Ongoing difficulty - thought alz vs vascular dementia.  Avoiding donepezil in cardiac history.  Consider namenda trial.

## 2024-06-26 NOTE — Assessment & Plan Note (Signed)
 Pending retina specialist appt.

## 2024-06-26 NOTE — Assessment & Plan Note (Addendum)
 Recommend ambulatory assistive device use ie cane, walker.  Discussed increased risk of fracture in osteoporosis.  Discussed how diabetic neuropathy can contribute to unsteadiness and increased fall risk Offered outpatient PT for balance training/fall prevention - will consider. Memory issues may limit full benefit of physical therapy program.

## 2024-06-26 NOTE — Assessment & Plan Note (Addendum)
 Tolerating farxiga  - continue this with Entresto  in h/o diabetic nephropathy with microalbuminuria.

## 2024-06-26 NOTE — Assessment & Plan Note (Signed)
 H/o this, latest CBC  with normal Hgb. Will continue to monitor.

## 2024-06-26 NOTE — Assessment & Plan Note (Signed)
 Reviewed sleep hygiene measures - specifically recommended against screen use at night, and try to avoid daytime naps.

## 2024-06-26 NOTE — Assessment & Plan Note (Signed)
 Lungs clear, no signs of bacterial infection.  Possible allergic component - discussed antihistamine use. Given age and comorbidities and upcoming travel, provided with WASP for amoxicillin course with indications when to fill.

## 2024-06-27 LAB — CUP PACEART REMOTE DEVICE CHECK
Battery Remaining Longevity: 24 mo
Battery Remaining Percentage: 26 %
Battery Voltage: 2.89 V
Brady Statistic AP VP Percent: 12 %
Brady Statistic AP VS Percent: 1 %
Brady Statistic AS VP Percent: 85 %
Brady Statistic AS VS Percent: 2.8 %
Brady Statistic RA Percent Paced: 12 %
Date Time Interrogation Session: 20251006165924
HighPow Impedance: 81 Ohm
HighPow Impedance: 81 Ohm
Implantable Lead Connection Status: 753985
Implantable Lead Connection Status: 753985
Implantable Lead Connection Status: 753985
Implantable Lead Implant Date: 20180906
Implantable Lead Implant Date: 20180906
Implantable Lead Implant Date: 20180906
Implantable Lead Location: 753858
Implantable Lead Location: 753859
Implantable Lead Location: 753860
Implantable Pulse Generator Implant Date: 20180906
Lead Channel Impedance Value: 1575 Ohm
Lead Channel Impedance Value: 480 Ohm
Lead Channel Impedance Value: 600 Ohm
Lead Channel Pacing Threshold Amplitude: 0.75 V
Lead Channel Pacing Threshold Amplitude: 0.75 V
Lead Channel Pacing Threshold Amplitude: 1.25 V
Lead Channel Pacing Threshold Pulse Width: 0.5 ms
Lead Channel Pacing Threshold Pulse Width: 0.5 ms
Lead Channel Pacing Threshold Pulse Width: 0.5 ms
Lead Channel Sensing Intrinsic Amplitude: 1.1 mV
Lead Channel Sensing Intrinsic Amplitude: 11.9 mV
Lead Channel Setting Pacing Amplitude: 2 V
Lead Channel Setting Pacing Amplitude: 2.5 V
Lead Channel Setting Pacing Amplitude: 2.5 V
Lead Channel Setting Pacing Pulse Width: 0.5 ms
Lead Channel Setting Pacing Pulse Width: 0.5 ms
Lead Channel Setting Sensing Sensitivity: 0.5 mV
Pulse Gen Serial Number: 9776461
Zone Setting Status: 755011

## 2024-06-27 NOTE — Progress Notes (Signed)
 Remote ICD Transmission

## 2024-06-28 ENCOUNTER — Ambulatory Visit: Payer: Self-pay | Admitting: Internal Medicine

## 2024-07-02 NOTE — Progress Notes (Signed)
 Remote ICD Transmission

## 2024-07-09 ENCOUNTER — Other Ambulatory Visit: Payer: Self-pay | Admitting: Cardiovascular Disease

## 2024-07-09 DIAGNOSIS — I5022 Chronic systolic (congestive) heart failure: Secondary | ICD-10-CM

## 2024-07-16 ENCOUNTER — Ambulatory Visit: Admitting: Podiatry

## 2024-07-20 DIAGNOSIS — Z961 Presence of intraocular lens: Secondary | ICD-10-CM | POA: Diagnosis not present

## 2024-07-20 DIAGNOSIS — Z9889 Other specified postprocedural states: Secondary | ICD-10-CM | POA: Diagnosis not present

## 2024-07-20 DIAGNOSIS — H35373 Puckering of macula, bilateral: Secondary | ICD-10-CM | POA: Diagnosis not present

## 2024-07-20 DIAGNOSIS — E119 Type 2 diabetes mellitus without complications: Secondary | ICD-10-CM | POA: Diagnosis not present

## 2024-07-20 LAB — OPHTHALMOLOGY REPORT-SCANNED

## 2024-07-23 ENCOUNTER — Ambulatory Visit: Admitting: Cardiovascular Disease

## 2024-07-27 ENCOUNTER — Ambulatory Visit: Attending: Cardiovascular Disease | Admitting: Cardiovascular Disease

## 2024-07-27 ENCOUNTER — Encounter: Payer: Self-pay | Admitting: Cardiovascular Disease

## 2024-07-27 VITALS — BP 107/61 | HR 63 | Ht 61.0 in | Wt 122.0 lb

## 2024-07-27 DIAGNOSIS — I110 Hypertensive heart disease with heart failure: Secondary | ICD-10-CM | POA: Diagnosis not present

## 2024-07-27 DIAGNOSIS — I255 Ischemic cardiomyopathy: Secondary | ICD-10-CM

## 2024-07-27 DIAGNOSIS — I2510 Atherosclerotic heart disease of native coronary artery without angina pectoris: Secondary | ICD-10-CM | POA: Diagnosis not present

## 2024-07-27 DIAGNOSIS — I10 Essential (primary) hypertension: Secondary | ICD-10-CM | POA: Diagnosis not present

## 2024-07-27 DIAGNOSIS — E1169 Type 2 diabetes mellitus with other specified complication: Secondary | ICD-10-CM

## 2024-07-27 DIAGNOSIS — I5022 Chronic systolic (congestive) heart failure: Secondary | ICD-10-CM

## 2024-07-27 DIAGNOSIS — Z9861 Coronary angioplasty status: Secondary | ICD-10-CM

## 2024-07-27 DIAGNOSIS — E785 Hyperlipidemia, unspecified: Secondary | ICD-10-CM

## 2024-07-27 NOTE — Assessment & Plan Note (Signed)
 History of essential hypertension blood pressure measured today at 107/61.  She is on carvedilol  and Entresto .

## 2024-07-27 NOTE — Assessment & Plan Note (Signed)
 History of CAD status post circumflex and obtuse marginal branch stenting in [place]  10/31/2015 with a 2.5 x 30 mm long resolute DES placed in the obtuse marginal branch and a 3.5 x 12 mm long stent placed in the circumflex.  She was on Brilinta  which was discontinued.  She remains on a baby aspirin.  She is asymptomatic.

## 2024-07-27 NOTE — Patient Instructions (Signed)
 Medication Instructions:  Your physician recommends that you continue on your current medications as directed. Please refer to the Current Medication list given to you today.  *If you need a refill on your cardiac medications before your next appointment, please call your pharmacy*   Follow-Up: At [facility], you and your health needs are our priority.  As part of our continuing mission to provide you with exceptional heart care, our providers are all part of one team.  This team includes your primary Cardiologist (physician) and Advanced Practice Providers or APPs (Physician Assistants and Nurse Practitioners) who all work together to provide you with the care you need, when you need it.  Your next appointment:   6 month(s)  Provider:   Damien Braver, NP         Then, Dorn Lesches, MD will plan to see you again in 12 month(s).

## 2024-07-27 NOTE — Progress Notes (Signed)
 07/27/2024 April Short   1935/08/02  [id number]  Primary Physician Rilla Baller, MD Primary Cardiologist: April April Lesches MD April Short, FSCAI  HPI:  April Short is a 89 y.o.  widowed Latino female accompanied by her daughters April Short today.  She currently lives with. I last saw her in the office 11//24. She was referred by April Short for cardiac evaluation to be established in our practice. She recently relocated from [place]  to [facility] to be closer to family. She does have a history of diabetes. She had 2 drug-eluting stents placed in her circumflex and obtuse marginal branch and [place]  10/31/15 with a 2.5 x 30 mm long resolute stent placement in obtuse marginal branch and a 3.5 mm x 12 mm long placed in the circumflex. She is on appropriate medicines for systolic heart failure. She recently lost a significant amount of weight secondary to diuresis. She isambulatory but really does not leave the house. She denies chest pain. She does have some dyspnea on exertion. She was on dual therapy with Brilenta, although the Brilinta  has been discontinued.April Short  She was transitioned to Entresto .  She had an ICD implanted in [place]  in 2018.   She was hospitalized with abdominal pain over a year ago and was found to have gallstone pancreatitis.  She underwent ERCP.  She was transferred from [facility] to [facility].  Her Entresto  and carvedilol  were held because of hypotension.  She was found to have portal vein thrombosis and was seen by vascular surgery who recommended 6 months of Eliquis .  Since I saw her a year ago she has remained stable.  She denies chest pain or shortness of breath.  Her last echo performed 09/06/2022 revealed normal LV systolic function with mild MR.   Current Meds  Medication Sig   albuterol  (PROVENTIL ) (2.5 MG/3ML) 0.083% nebulizer solution Take 3 mLs (2.5 mg total) by nebulization 3 (three) times daily as needed for wheezing or shortness of  breath.   aspirin EC 81 MG tablet Take 81 mg by mouth daily. Swallow whole.   atorvastatin  (LIPITOR) 40 MG tablet Take 1 tablet (40 mg total) by mouth daily.   carvedilol  (COREG ) 25 MG tablet TOME 1 TABLETA POR VIA ORAL DOS VECES AL DIA   Cholecalciferol (VITAMIN D3) 25 MCG (1000 UT) CAPS Take 2 capsules (2,000 Units total) by mouth daily.   glipiZIDE  (GLUCOTROL ) 5 MG tablet Take 1 tablet (5 mg total) by mouth daily before breakfast.   PROLIA  60 MG/ML SOSY injection Inject 60 mg into the skin every 6 (six) months.   sacubitril -valsartan  (ENTRESTO ) 49-51 MG Take 1 tablet by mouth 2 (two) times daily.   sertraline  (ZOLOFT ) 25 MG tablet Take 1 tablet (25 mg total) by mouth daily.   Current Facility-Administered Medications for the 07/27/24 encounter (Office Visit) with Short April JINNY, MD  Medication   [START ON 11/13/2024] denosumab  (PROLIA ) injection 60 mg     No Known Allergies  Social History   Socioeconomic History   Marital status: Widowed    Spouse name: Not on file   Number of children: 7   Years of education: Not on file   Highest education level: Not on file  Occupational History   Not on file  Tobacco Use   Smoking status: Never   Smokeless tobacco: Never  Substance and Sexual Activity   Alcohol use: Not Currently    Comment: occasionally   Drug use: No  Sexual activity: Not on file  Other Topics Concern   Not on file  Social History Narrative   Widow 2018, lives with daughter   From [place], lived in [place] then [place].    Moved to [facility] 2021 to be closer to family    Occ: retired   Activity:    Diet:    Social Drivers of Corporate Investment Banker Strain: Low Risk  (06/20/2023)   Overall Financial Resource Strain (CARDIA)    Difficulty of Paying Living Expenses: Not hard at all  Food Insecurity: No Food Insecurity (07/14/2023)   Hunger Vital Sign    Worried About Running Out of Food in the Last Year: Never true    Ran Out of Food in the Last Year: Never true   Transportation Needs: No Transportation Needs (07/14/2023)   PRAPARE - Administrator, Civil Service (Medical): No    Lack of Transportation (Non-Medical): No  Physical Activity: Inactive (06/20/2023)   Exercise Vital Sign    Days of Exercise per Week: 0 days    Minutes of Exercise per Session: 0 min  Stress: No Stress Concern Present (06/20/2023)   [employer] of Occupational Health - Occupational Stress Questionnaire    Feeling of Stress : Not at all  Social Connections: Socially Isolated (06/20/2023)   Social Connection and Isolation Panel    Frequency of Communication with Friends and Family: More than three times a week    Frequency of Social Gatherings with Friends and Family: Three times a week    Attends Religious Services: Never    Active Member of Clubs or Organizations: No    Attends Banker Meetings: Never    Marital Status: Widowed  Intimate Partner Violence: Not At Risk (07/14/2023)   Humiliation, Afraid, Rape, and Kick questionnaire    Fear of Current or Ex-Partner: No    Emotionally Abused: No    Physically Abused: No    Sexually Abused: No     Review of Systems: General: negative for chills, fever, night sweats or weight changes.  Cardiovascular: negative for chest pain, dyspnea on exertion, edema, orthopnea, palpitations, paroxysmal nocturnal dyspnea or shortness of breath Dermatological: negative for rash Respiratory: negative for cough or wheezing Urologic: negative for hematuria Abdominal: negative for nausea, vomiting, diarrhea, bright red blood per rectum, melena, or hematemesis Neurologic: negative for visual changes, syncope, or dizziness All other systems reviewed and are otherwise negative except as noted above.    Blood pressure 107/61, pulse 63, height 5' 1 (1.549 m), weight 122 lb (55.3 kg), SpO2 92%.  General appearance: alert and no distress Neck: no adenopathy, no carotid bruit, no JVD, supple, symmetrical,  trachea midline, and thyroid  not enlarged, symmetric, no tenderness/mass/nodules Lungs: clear to auscultation bilaterally Heart: regular rate and rhythm, S1, S2 normal, no murmur, click, rub or gallop Extremities: extremities normal, atraumatic, no cyanosis or edema Pulses: 2+ and symmetric Skin: Skin color, texture, turgor normal. No rashes or lesions Neurologic: Grossly normal  EKG EKG Interpretation Date/Time:  Friday July 27 2024 14:54:28 EST Ventricular Rate:  63 PR Interval:  220 QRS Duration:  144 QT Interval:  470 QTC Calculation: 480 R Axis:   25  Text Interpretation: Atrial-sensed ventricular-paced rhythm with prolonged AV conduction When compared with ECG of 13-Jul-2023 23:51, PREVIOUS ECG IS PRESENT Confirmed by Court Carrier [phone]) on 07/27/2024 3:13:46 PM    ASSESSMENT AND PLAN:   Ischemic cardiomyopathy History of ischemic cardiomyopathy on GDMT status post ICD implantation  in 2018 history of left bundle branch block.  Her most recent echo performed 09/06/2022 revealed an EF of 50 to 55% without valvular abnormalities.  She did have mild MR.  She is completely asymptomatic.  Hypertensive heart disease History of essential hypertension blood pressure measured today at 107/61.  She is on carvedilol  and Entresto .  Hyperlipidemia associated with type 2 diabetes mellitus (HCC) History of hyperlipidemia on statin therapy with lipid profile performed 12/19/2023 revealing total cholesterol 155, LDL of 73 and HDL 47.  CAD S/P percutaneous coronary angioplasty History of CAD status post circumflex and obtuse marginal branch stenting in [place]  10/31/2015 with a 2.5 x 30 mm long resolute DES placed in the obtuse marginal branch and a 3.5 x 12 mm long stent placed in the circumflex.  She was on Brilinta  which was discontinued.  She remains on a baby aspirin.  She is asymptomatic.     April DOROTHA Lesches MD FACP,FACC,FAHA, [facility] 07/27/2024 3:24 PM

## 2024-07-27 NOTE — Assessment & Plan Note (Signed)
 History of hyperlipidemia on statin therapy with lipid profile performed 12/19/2023 revealing total cholesterol 155, LDL of 73 and HDL 47.

## 2024-07-27 NOTE — Assessment & Plan Note (Signed)
 History of ischemic cardiomyopathy on GDMT status post ICD implantation in 2018 history of left bundle branch block.  Her most recent echo performed 09/06/2022 revealed an EF of 50 to 55% without valvular abnormalities.  She did have mild MR.  She is completely asymptomatic.

## 2024-07-30 ENCOUNTER — Ambulatory Visit: Admitting: Podiatry

## 2024-07-30 ENCOUNTER — Encounter: Payer: Self-pay | Admitting: Podiatry

## 2024-07-30 DIAGNOSIS — E1142 Type 2 diabetes mellitus with diabetic polyneuropathy: Secondary | ICD-10-CM

## 2024-07-30 DIAGNOSIS — M79675 Pain in left toe(s): Secondary | ICD-10-CM | POA: Diagnosis not present

## 2024-07-30 DIAGNOSIS — B351 Tinea unguium: Secondary | ICD-10-CM | POA: Diagnosis not present

## 2024-07-30 DIAGNOSIS — M79674 Pain in right toe(s): Secondary | ICD-10-CM | POA: Diagnosis not present

## 2024-08-05 NOTE — Progress Notes (Signed)
  Subjective:  Patient ID: April Short, female    DOB: 1935-08-18,  MRN: [id number]  April Short presents to clinic today for at risk foot care with history of diabetic neuropathy and painful thick toenails that are difficult to trim. Pain interferes with ambulation. Aggravating factors include wearing enclosed shoe gear. Pain is relieved with periodic professional debridement. Her daughter is present during today's visit. Chief Complaint  Patient presents with   Toe Pain    DFC. A1c 7. Rilla is her PCP. Last visit was in Oct.   New problem(s): None.   PCP is Rilla Baller, MD.  No Known Allergies  Review of Systems: Negative except as noted in the HPI.  Objective: No changes noted in today's physical examination. There were no vitals filed for this visit. April Short is a pleasant 89 y.o. female WD, WN in NAD. AAO x 3.  Vascular Examination: CFT <3 seconds b/l. DP pulses faintly palpable b/l. PT pulses nonpalpable b/l. Digital hair absent. Skin temperature gradient warm to warm b/l. No pain with calf compression. No ischemia or gangrene. No cyanosis or clubbing noted b/l. No edema noted b/l LE.   Neurological Examination:  Protective sensation diminished with 10g monofilament b/l.  Dermatological Examination: Pedal skin warm and supple b/l. No open wounds b/l. No interdigital macerations. Toenails 2-5 b/l thick, discolored, elongated with subungual debris and pain on dorsal palpation. Bilateral great toenails are short and mycotic. No hyperkeratotic nor porokeratotic lesions.  No corns, calluses, nor porokeratotic lesions.   Musculoskeletal Examination: Muscle strength 5/5 to all lower extremity muscle groups bilaterally. HAV with bunion deformity noted b/l LE.  Radiographs: None  Assessment/Plan: 1. Pain due to onychomycosis of toenails of both feet   2. Diabetic polyneuropathy associated with type 2 diabetes mellitus (HCC)   Patient was evaluated and  treated. All patient's and/or POA's questions/concerns addressed on today's visit. Toenails 2-5 bilaterally debrided in length and girth without incident. Continue soft, supportive shoe gear daily. Report any pedal injuries to medical professional. Call office if there are any questions/concerns. -Patient/POA to call should there be question/concern in the interim.   Return in about 3 months (around 10/30/2024).  Delon LITTIE Merlin, DPM      [place] LOCATION: [address] [place] [address]                   Office [phone]   [facility] LOCATION: [address] [place], [place] [address] Office [phone]

## 2024-08-19 ENCOUNTER — Other Ambulatory Visit: Payer: Self-pay | Admitting: Cardiovascular Disease

## 2024-09-24 ENCOUNTER — Ambulatory Visit: Payer: Self-pay

## 2024-09-24 DIAGNOSIS — I2510 Atherosclerotic heart disease of native coronary artery without angina pectoris: Secondary | ICD-10-CM

## 2024-09-25 LAB — CUP PACEART REMOTE DEVICE CHECK
Battery Remaining Longevity: 25 mo
Battery Remaining Percentage: 27 %
Battery Voltage: 2.87 V
Brady Statistic AP VP Percent: 11 %
Brady Statistic AP VS Percent: 1 %
Brady Statistic AS VP Percent: 86 %
Brady Statistic AS VS Percent: 2.8 %
Brady Statistic RA Percent Paced: 11 %
Date Time Interrogation Session: 20260105040017
HighPow Impedance: 80 Ohm
HighPow Impedance: 80 Ohm
Implantable Lead Connection Status: 753985
Implantable Lead Connection Status: 753985
Implantable Lead Connection Status: 753985
Implantable Lead Implant Date: 20180906
Implantable Lead Implant Date: 20180906
Implantable Lead Implant Date: 20180906
Implantable Lead Location: 753858
Implantable Lead Location: 753859
Implantable Lead Location: 753860
Implantable Pulse Generator Implant Date: 20180906
Lead Channel Impedance Value: 1800 Ohm
Lead Channel Impedance Value: 510 Ohm
Lead Channel Impedance Value: 630 Ohm
Lead Channel Pacing Threshold Amplitude: 0.75 V
Lead Channel Pacing Threshold Amplitude: 0.75 V
Lead Channel Pacing Threshold Amplitude: 1.25 V
Lead Channel Pacing Threshold Pulse Width: 0.5 ms
Lead Channel Pacing Threshold Pulse Width: 0.5 ms
Lead Channel Pacing Threshold Pulse Width: 0.5 ms
Lead Channel Sensing Intrinsic Amplitude: 1.3 mV
Lead Channel Sensing Intrinsic Amplitude: 11.9 mV
Lead Channel Setting Pacing Amplitude: 2 V
Lead Channel Setting Pacing Amplitude: 2.5 V
Lead Channel Setting Pacing Amplitude: 2.5 V
Lead Channel Setting Pacing Pulse Width: 0.5 ms
Lead Channel Setting Pacing Pulse Width: 0.5 ms
Lead Channel Setting Sensing Sensitivity: 0.5 mV
Pulse Gen Serial Number: 9776461
Zone Setting Status: 755011

## 2024-09-27 NOTE — Progress Notes (Signed)
 Remote ICD Transmission

## 2024-09-29 ENCOUNTER — Ambulatory Visit: Payer: Self-pay | Admitting: Cardiology

## 2024-10-22 ENCOUNTER — Telehealth: Payer: Self-pay

## 2024-10-22 ENCOUNTER — Other Ambulatory Visit (HOSPITAL_COMMUNITY): Payer: Self-pay

## 2024-10-22 NOTE — Telephone Encounter (Signed)
 Prolia  VOB initiated via [email or web address]  Next Prolia  inj DUE: 11/17/24

## 2024-10-23 ENCOUNTER — Other Ambulatory Visit (HOSPITAL_COMMUNITY): Payer: Self-pay

## 2024-10-23 NOTE — Telephone Encounter (Signed)
 April Short

## 2024-10-23 NOTE — Telephone Encounter (Signed)
 SABRA

## 2024-11-05 ENCOUNTER — Ambulatory Visit: Admitting: Podiatry

## 2024-12-17 ENCOUNTER — Ambulatory Visit

## 2024-12-17 ENCOUNTER — Other Ambulatory Visit

## 2024-12-24 ENCOUNTER — Encounter: Admitting: Family Medicine
